# Patient Record
Sex: Female | Born: 1960
Health system: Southern US, Community
[De-identification: ages and names within clinical notes are randomized; demographics above are authoritative.]

## PROBLEM LIST (undated history)

## (undated) DIAGNOSIS — R87619 Unspecified abnormal cytological findings in specimens from cervix uteri: Secondary | ICD-10-CM

## (undated) DIAGNOSIS — M419 Scoliosis, unspecified: Secondary | ICD-10-CM

## (undated) DIAGNOSIS — I10 Essential (primary) hypertension: Secondary | ICD-10-CM

## (undated) DIAGNOSIS — F419 Anxiety disorder, unspecified: Secondary | ICD-10-CM

## (undated) DIAGNOSIS — F32A Depression, unspecified: Secondary | ICD-10-CM

## (undated) DIAGNOSIS — E785 Hyperlipidemia, unspecified: Secondary | ICD-10-CM

## (undated) DIAGNOSIS — IMO0002 Reserved for concepts with insufficient information to code with codable children: Secondary | ICD-10-CM

## (undated) DIAGNOSIS — D649 Anemia, unspecified: Secondary | ICD-10-CM

## (undated) HISTORY — DX: Anxiety disorder, unspecified: F41.9

## (undated) HISTORY — DX: Essential (primary) hypertension: I10

## (undated) HISTORY — PX: COLONOSCOPY: SHX174

## (undated) HISTORY — PX: SPINE SURGERY: SHX786

## (undated) HISTORY — PX: UPPER GI ENDOSCOPY: SHX6162

## (undated) HISTORY — PX: EYE SURGERY: SHX253

## (undated) HISTORY — DX: Anemia, unspecified: D64.9

## (undated) HISTORY — DX: Depression, unspecified: F32.A

## (undated) HISTORY — DX: Reserved for concepts with insufficient information to code with codable children: IMO0002

## (undated) HISTORY — PX: WISDOM TOOTH EXTRACTION: SHX21

## (undated) HISTORY — DX: Unspecified abnormal cytological findings in specimens from cervix uteri: R87.619

---

## 1999-04-13 ENCOUNTER — Other Ambulatory Visit: Admission: RE | Admit: 1999-04-13 | Discharge: 1999-04-13 | Payer: Self-pay | Admitting: Obstetrics and Gynecology

## 2000-04-19 ENCOUNTER — Other Ambulatory Visit: Admission: RE | Admit: 2000-04-19 | Discharge: 2000-04-19 | Payer: Self-pay | Admitting: *Deleted

## 2001-04-19 ENCOUNTER — Other Ambulatory Visit: Admission: RE | Admit: 2001-04-19 | Discharge: 2001-04-19 | Payer: Self-pay | Admitting: *Deleted

## 2001-12-27 ENCOUNTER — Encounter: Admission: RE | Admit: 2001-12-27 | Discharge: 2002-02-05 | Payer: Self-pay | Admitting: Internal Medicine

## 2002-05-14 ENCOUNTER — Other Ambulatory Visit: Admission: RE | Admit: 2002-05-14 | Discharge: 2002-05-14 | Payer: Self-pay | Admitting: Obstetrics and Gynecology

## 2003-05-16 ENCOUNTER — Other Ambulatory Visit: Admission: RE | Admit: 2003-05-16 | Discharge: 2003-05-16 | Payer: Self-pay | Admitting: Obstetrics and Gynecology

## 2004-07-14 ENCOUNTER — Other Ambulatory Visit: Admission: RE | Admit: 2004-07-14 | Discharge: 2004-07-14 | Payer: Self-pay | Admitting: Obstetrics and Gynecology

## 2005-07-18 ENCOUNTER — Other Ambulatory Visit: Admission: RE | Admit: 2005-07-18 | Discharge: 2005-07-18 | Payer: Self-pay | Admitting: Obstetrics and Gynecology

## 2006-01-16 ENCOUNTER — Ambulatory Visit: Payer: Self-pay | Admitting: Family Medicine

## 2006-02-01 ENCOUNTER — Ambulatory Visit: Payer: Self-pay | Admitting: Family Medicine

## 2006-02-08 ENCOUNTER — Ambulatory Visit: Payer: Self-pay | Admitting: Family Medicine

## 2006-04-13 ENCOUNTER — Ambulatory Visit: Payer: Self-pay | Admitting: Family Medicine

## 2006-05-18 ENCOUNTER — Ambulatory Visit: Payer: Self-pay | Admitting: Gastroenterology

## 2006-05-22 ENCOUNTER — Ambulatory Visit (HOSPITAL_COMMUNITY): Admission: RE | Admit: 2006-05-22 | Discharge: 2006-05-22 | Payer: Self-pay | Admitting: Gastroenterology

## 2006-05-26 ENCOUNTER — Ambulatory Visit: Payer: Self-pay | Admitting: Gastroenterology

## 2006-05-30 ENCOUNTER — Ambulatory Visit: Payer: Self-pay | Admitting: Gastroenterology

## 2006-05-30 ENCOUNTER — Encounter (INDEPENDENT_AMBULATORY_CARE_PROVIDER_SITE_OTHER): Payer: Self-pay | Admitting: Specialist

## 2006-06-15 ENCOUNTER — Ambulatory Visit: Payer: Self-pay | Admitting: Gastroenterology

## 2006-06-21 ENCOUNTER — Ambulatory Visit: Payer: Self-pay | Admitting: Critical Care Medicine

## 2006-07-17 ENCOUNTER — Ambulatory Visit: Payer: Self-pay | Admitting: Critical Care Medicine

## 2006-09-14 ENCOUNTER — Other Ambulatory Visit: Admission: RE | Admit: 2006-09-14 | Discharge: 2006-09-14 | Payer: Self-pay | Admitting: Obstetrics & Gynecology

## 2007-05-26 ENCOUNTER — Emergency Department (HOSPITAL_COMMUNITY): Admission: EM | Admit: 2007-05-26 | Discharge: 2007-05-27 | Payer: Self-pay | Admitting: Emergency Medicine

## 2007-06-04 ENCOUNTER — Ambulatory Visit: Payer: Self-pay | Admitting: Family Medicine

## 2007-06-04 DIAGNOSIS — S139XXA Sprain of joints and ligaments of unspecified parts of neck, initial encounter: Secondary | ICD-10-CM | POA: Insufficient documentation

## 2007-06-06 ENCOUNTER — Telehealth (INDEPENDENT_AMBULATORY_CARE_PROVIDER_SITE_OTHER): Payer: Self-pay | Admitting: *Deleted

## 2007-08-21 ENCOUNTER — Ambulatory Visit: Payer: Self-pay | Admitting: Family Medicine

## 2007-08-23 ENCOUNTER — Encounter (INDEPENDENT_AMBULATORY_CARE_PROVIDER_SITE_OTHER): Payer: Self-pay | Admitting: *Deleted

## 2007-09-18 ENCOUNTER — Other Ambulatory Visit: Admission: RE | Admit: 2007-09-18 | Discharge: 2007-09-18 | Payer: Self-pay | Admitting: Obstetrics and Gynecology

## 2007-11-26 ENCOUNTER — Ambulatory Visit: Payer: Self-pay | Admitting: Family Medicine

## 2007-12-06 LAB — CONVERTED CEMR LAB
AST: 22 units/L (ref 0–37)
Bilirubin, Direct: 0.1 mg/dL (ref 0.0–0.3)
HDL: 77 mg/dL (ref >39.0)
Total Bilirubin: 0.7 mg/dL (ref 0.3–1.2)
Total Protein: 6.9 g/dL (ref 6.0–8.3)
Triglycerides: 158 mg/dL — ABNORMAL HIGH (ref 0–149)
VLDL: 32 mg/dL (ref 0–40)

## 2007-12-07 ENCOUNTER — Encounter (INDEPENDENT_AMBULATORY_CARE_PROVIDER_SITE_OTHER): Payer: Self-pay | Admitting: *Deleted

## 2008-08-11 ENCOUNTER — Ambulatory Visit: Payer: Self-pay | Admitting: Family Medicine

## 2008-08-11 DIAGNOSIS — J069 Acute upper respiratory infection, unspecified: Secondary | ICD-10-CM | POA: Insufficient documentation

## 2008-08-11 LAB — CONVERTED CEMR LAB: Rapid Strep: NEGATIVE

## 2008-08-19 ENCOUNTER — Telehealth: Payer: Self-pay | Admitting: Family Medicine

## 2008-11-18 ENCOUNTER — Other Ambulatory Visit: Admission: RE | Admit: 2008-11-18 | Discharge: 2008-11-18 | Payer: Self-pay | Admitting: Obstetrics & Gynecology

## 2009-06-08 ENCOUNTER — Ambulatory Visit: Payer: Self-pay | Admitting: Family Medicine

## 2010-09-12 HISTORY — PX: REFRACTIVE SURGERY: SHX103

## 2010-10-10 LAB — CONVERTED CEMR LAB
ALT: 19 units/L (ref 0–35)
AST: 20 units/L (ref 0–37)
AST: 23 units/L (ref 0–37)
Albumin: 4 g/dL (ref 3.5–5.2)
Alkaline Phosphatase: 56 units/L (ref 39–117)
BUN: 18 mg/dL (ref 6–23)
Basophils Absolute: 0 10*3/uL (ref 0.0–0.1)
Bilirubin, Direct: 0 mg/dL (ref 0.0–0.3)
CO2: 29 meq/L (ref 19–32)
Calcium: 9 mg/dL (ref 8.4–10.5)
Chloride: 102 meq/L (ref 96–112)
Chloride: 107 meq/L (ref 96–112)
Direct LDL: 63.7 mg/dL
Eosinophils Absolute: 0.1 10*3/uL (ref 0.0–0.6)
Eosinophils Absolute: 0.1 10*3/uL (ref 0.0–0.7)
GFR calc non Af Amer: 96 mL/min
Glucose, Bld: 83 mg/dL (ref 70–99)
HCT: 36.8 % (ref 36.0–46.0)
HDL: 59.9 mg/dL (ref >39.00)
HDL: 93.8 mg/dL (ref >39.0)
Lymphs Abs: 1.4 10*3/uL (ref 0.7–4.0)
MCHC: 34.8 g/dL (ref 30.0–36.0)
MCHC: 34.9 g/dL (ref 30.0–36.0)
MCV: 94.9 fL (ref 78.0–100.0)
MCV: 97.1 fL (ref 78.0–100.0)
Neutro Abs: 6.5 10*3/uL (ref 1.4–7.7)
Neutrophils Relative %: 67.6 % (ref 43.0–77.0)
Pap Smear: NORMAL
Platelets: 210 10*3/uL (ref 150.0–400.0)
Platelets: 243 10*3/uL (ref 150–400)
Potassium: 4.5 meq/L (ref 3.5–5.1)
RBC: 3.7 M/uL — ABNORMAL LOW (ref 3.87–5.11)
RDW: 11.5 % (ref 11.5–14.6)
Sodium: 136 meq/L (ref 135–145)
Total Protein: 7.2 g/dL (ref 6.0–8.3)
Triglycerides: 219 mg/dL (ref 0–149)

## 2011-01-28 NOTE — Assessment & Plan Note (Signed)
[facility]                               PULMONARY OFFICE NOTE   Nicole Blackburn, Nicole Blackburn                        MRN:          [id number]  DATE:06/21/2006                            DOB:          08-08-1961    CHIEF COMPLAINT:  Evaluate cough.   This is a 50 year old white female whose had a chronic cough for several  months, no chest pain, no wheezing. The cough is productive of clear mucous.  There is no hemoptysis. She had some postnasal drainage, no real heartburn,  denies any cough at night, initially was thought to have GI symptom complex  and underwent upper endoscopy. This revealed a mild lesion at the distal  esophagus. Barium swallow was unremarkable. There was no dyspnea with  exertion though, no symptoms of shortness of breath at all, there was no  chest pain, no regular heartbeats, no symptoms of loss of appetite,  indigestion, weight change, abdominal pain and there is no difficulty  swallowing, no sore throat, tooth or dental problems. No headaches, nasal  congestion, sneezing, itching, earache, anxiety, depression, hand or foot  swelling. There is no joint stiffness, rash or change in color of mucous.   PAST MEDICAL HISTORY:  Medical, no history of hypertension, heart disease,  or angina. No history of heart failure, heart dysrhythmias, asthma,  emphysema, diabetes, increased cholesterol. No history of stroke, blood  clots, cancer, allergies, headache, sleep disordered breathing.   PAST SURGICAL HISTORY:  None.   MEDICATION ALLERGIES:  None.   CURRENT MEDICATIONS:  Birth control pills, multivitamin and p.r.n. Mucinex.   SOCIAL HISTORY:  Smoked less than half a pack a day during college but quit  since that time. Works as an Airline pilot. Lives with her fiance, is divorced,  has children.   FAMILY HISTORY:  Cancer in maternal grandmother and maternal grandfather.   REVIEW OF SYSTEMS:  Otherwise noncontributory.   PHYSICAL  EXAMINATION:  GENERAL:  This is a middle-age female in no distress.  VITAL SIGNS:  Temperature 98, blood pressure 118/82, pulse 71, saturation  98% on room air, weight 105 pounds.  CHEST:  Showed to be completely clear without evidence of any adventitious  breath sounds.  CARDIAC:  Showed a regular rate and rhythm without S3, normal S1, S2.  ABDOMEN:  Soft, nontender.  EXTREMITIES:  Showed no edema or clubbing.  SKIN:  Clear.  NEUROLOGIC:  Intact.  HEENT:  No jugular venous distention. No lymphadenopathy. Oropharynx clear.  Nares showed mild nasal inflammation with posterior pharyngeal drainage as  documented and cobblestoning seen of the posterior oropharynx.  NECK:  Supple.   LABORATORY DATA:  Spirometry was obtained and revealed normal spirometry  FEV1 of 106% of predicted, FEC of 115% of predicted. Chest x-ray showed no  active disease process.   IMPRESSION:  Likely cyclical cough on the basis of post nasal drip syndrome  with chronic allergic  rhinitis and probable occult laryngopharyngeal reflux  both of which are triggering an aggravating cough Synthroid.   RECOMMENDATIONS:  Begin cyclic cough protocol with Tussionex and Tessalon  Perles. This  was gone over with the patient in great detail. Begin Zegerid  40 mg a day for laryngopharyngeal reflux for 6 weeks and begin Brovex which  is Brompheniramine 5 mL b.i.d. for nasal decongestion and reduction of  posterior nasal drainage. We will see this patient back in return followup  in 6 weeks.       Charlcie Cradle Delford Field, MD, [facility]      PEW/MedQ  DD:  06/21/2006  DT:  06/23/2006  Job #:  [id number]   cc:   Barbette Hair. Arlyce Dice, MD,FACG  Lelon Perla, DO

## 2011-01-28 NOTE — Assessment & Plan Note (Signed)
[facility]                               PULMONARY OFFICE NOTE   Nicole, Blackburn                        MRN:          [id number]  DATE:07/17/2006                            DOB:          23-Feb-1961    Ms. Nicole Blackburn returns today.  She is a 50 year old white female with cyclic  cough, likely due to postnasal drainage and reflux disease.  She uses BroveX  p.r.n. and the cough protocol.  Her cough is now gone.  She has maintained  Zegerid 40 mg daily, and on this, she is no longer having any reflux  symptoms complex.   PHYSICAL EXAMINATION:  VITAL SIGNS:  Temp 98, blood pressure 90/66, pulse  68, saturation 99% on room air.  CHEST:  Completely clear without evidence of wheeze, rales, or rhonchi.  CARDIAC:  Regular rate and rhythm without S3.  Normal S1 and S2.  ABDOMEN:  Soft, nontender.  EXTREMITIES:  No clubbing or edema.  SKIN:  Clear.  NEUROLOGIC:  Intact.  HEENT:  No jugular venous distention.  No lymphadenopathy.  Oropharynx  clear.  NECK:  Supple.   IMPRESSION:  Cyclic cough on the basis of post nasal drip syndrome and  reflux disease.   The plan is to maintain Zegerid 40 mg daily and to utilize cough suppression  p.r.n.  We will see this patient back on an as-needed basis.  She was also  given a reflux disease diet.     Charlcie Cradle Delford Field, MD, [facility]  Electronically Signed    PEW/MedQ  DD: 07/17/2006  DT: 07/18/2006  Job #: [id number]   cc:   Loreen Freud, M.D.  Barbette Hair. Arlyce Dice, MD,FACG

## 2011-01-28 NOTE — Assessment & Plan Note (Signed)
[facility]                           GASTROENTEROLOGY OFFICE NOTE   Nicole Blackburn, Nicole Blackburn                        MRN:          [id number]  DATE:06/15/2006                            DOB:          21-Mar-1961    PROBLEM:  Cough.   Nicole Blackburn has returned for scheduled GI followup.  Cough is minimally  improved with Mucinex.  She complains of spontaneous fits of coughing, or  coughing that will occur within 5 minutes of eating.  Barium swallow is  negative.  Upper endoscopy was also unremarkable.   EXAMINATION:  Pulse 68, blood pressure 102/60, weight 107.   IMPRESSION:  Persistent cough that is both spontaneous and immediately  postprandial.  There is no evidence for GI disease including reflux or  aspiration.  This raises question of sinus drip or other pulmonary causes  for coughing including allergies.   RECOMMENDATIONS:  Pulmonary consult.       Barbette Hair. Arlyce Dice, MD,FACG      RDK/MedQ  DD:  06/15/2006  DT:  06/16/2006  Job #:  [id number]   cc:   Lelon Perla, DO

## 2011-01-28 NOTE — Assessment & Plan Note (Signed)
[facility]                           GASTROENTEROLOGY OFFICE NOTE   ENDIA, MONCUR                        MRN:          [id number]  DATE:05/18/2006                            DOB:          06/07/1961    PROBLEM:  Cough.   REASON:  Nicole Blackburn is a 50 -year-old white female referred through the  courtesy of Dr. Laury Axon for evaluation.  She has been complaining of immediate  post prandial coughing.  This may last five to ten minutes.  It is initially  non-productive then does become productive.  She denies dysphagia,  odynophagia or pyrosis.  She has taken both Protonix and Nexium without  improvement in her symptoms.  There is no history of asthma, sore throat or  regurgitation of gastric contents.   PAST MEDICAL HISTORY:  Unremarkable.   FAMILY HISTORY:  Non-contributory.   MEDICATIONS:  Only medications is an oral contraceptive.   ALLERGIES:  She has no allergies.   SOCIAL HISTORY:  She doe not smoke.  She drinks occasionally.  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   REVIEW OF SYSTEMS:  Was reviewed and is negative.   PHYSICAL EXAMINATION:  VITAL SIGNS:  Pulse 64.  Blood pressure 110/60.  Weight 108.  HEENT: EOMI. PERRLA. Sclerae are anicteric.  Conjunctivae are pink.  NECK:  Supple without thyromegaly, adenopathy or carotid bruits.  CHEST:  Clear to auscultation and percussion without adventitious sounds.  CARDIAC:  Regular rhythm; normal S1 S2.  There are no murmurs, gallops or  rubs.  ABDOMEN:  Bowel sounds are normoactive.  Abdomen is soft, non-tender and non-  distended.  There are no abdominal masses, tenderness, splenic enlargement  or hepatomegaly.  EXTREMITIES:  Full range of motion.  No cyanosis, clubbing or edema.  RECTAL:  Deferred.   IMPRESSION:  Post prandial cough.  This raises the question of a possible  minimal aspiration.  She denies dysphagia which would medicate against an  esophageal stricture.  There  are no obvious thyroid masses.   RECOMMENDATION:  Barium swallow.                                   Barbette Hair. Arlyce Dice, MD,FACG   RDK/MedQ  DD:  05/18/2006  DT:  05/18/2006  Job #:  [id number]   cc:   Lelon Perla, DO

## 2011-04-28 ENCOUNTER — Encounter: Payer: Self-pay | Admitting: Family Medicine

## 2011-06-24 LAB — POCT I-STAT CREATININE
Creatinine, Ser: 0.8
Operator id: 265201

## 2011-06-24 LAB — COMPREHENSIVE METABOLIC PANEL
ALT: 24
AST: 26
Albumin: 4
Alkaline Phosphatase: 55
BUN: 16
CO2: 26
Calcium: 9.2
Chloride: 105
Creatinine, Ser: 0.69
GFR calc non Af Amer: >60
Glucose, Bld: 86
Potassium: 4
Sodium: 138
Total Bilirubin: 0.2 — ABNORMAL LOW
Total Protein: 7

## 2011-06-24 LAB — I-STAT 8, (EC8 V) (CONVERTED LAB)
Acid-base deficit: 1
BUN: 17
Bicarbonate: 24.6 — ABNORMAL HIGH
Chloride: 106
Glucose, Bld: 88
HCT: 36
Hemoglobin: 12.2
Operator id: 265201
Potassium: 4.1
Sodium: 136
TCO2: 26
pCO2, Ven: 42.5 — ABNORMAL LOW
pH, Ven: 7.371 — ABNORMAL HIGH

## 2011-06-24 LAB — POCT CARDIAC MARKERS
CKMB, poc: <1 — ABNORMAL LOW
Myoglobin, poc: 36.6
Operator id: 265201
Troponin i, poc: <0.05

## 2011-06-24 LAB — D-DIMER, QUANTITATIVE

## 2011-06-24 LAB — LIPASE, BLOOD: Lipase: 32

## 2012-06-07 ENCOUNTER — Encounter: Payer: Self-pay | Admitting: Family Medicine

## 2012-10-18 ENCOUNTER — Encounter: Payer: Self-pay | Admitting: Family Medicine

## 2012-10-26 ENCOUNTER — Encounter: Payer: Self-pay | Admitting: Nurse Practitioner

## 2012-10-26 DIAGNOSIS — D649 Anemia, unspecified: Secondary | ICD-10-CM | POA: Insufficient documentation

## 2012-12-07 ENCOUNTER — Ambulatory Visit: Payer: Self-pay | Admitting: Nurse Practitioner

## 2012-12-11 ENCOUNTER — Ambulatory Visit (INDEPENDENT_AMBULATORY_CARE_PROVIDER_SITE_OTHER): Payer: BC Managed Care – PPO | Admitting: Family Medicine

## 2012-12-11 ENCOUNTER — Encounter: Payer: Self-pay | Admitting: Family Medicine

## 2012-12-11 VITALS — BP 110/74 | HR 77 | Temp 98.7°F | Ht 59.5 in | Wt 110.4 lb

## 2012-12-11 DIAGNOSIS — M4120 Other idiopathic scoliosis, site unspecified: Secondary | ICD-10-CM

## 2012-12-11 DIAGNOSIS — Z0000 Encounter for general adult medical examination without abnormal findings: Secondary | ICD-10-CM

## 2012-12-11 DIAGNOSIS — M549 Dorsalgia, unspecified: Secondary | ICD-10-CM

## 2012-12-11 DIAGNOSIS — R05 Cough: Secondary | ICD-10-CM

## 2012-12-11 DIAGNOSIS — R059 Cough, unspecified: Secondary | ICD-10-CM | POA: Insufficient documentation

## 2012-12-11 LAB — BASIC METABOLIC PANEL
BUN: 13 mg/dL (ref 6–23)
Creatinine, Ser: 0.6 mg/dL (ref 0.4–1.2)
GFR: 103.74 mL/min (ref >60.00)

## 2012-12-11 LAB — HEPATIC FUNCTION PANEL
ALT: 19 U/L (ref 0–35)
AST: 24 U/L (ref 0–37)
Bilirubin, Direct: 0.2 mg/dL (ref 0.0–0.3)
Total Bilirubin: 1 mg/dL (ref 0.3–1.2)

## 2012-12-11 LAB — LIPID PANEL
LDL Cholesterol: 82 mg/dL (ref 0–99)
Total CHOL/HDL Ratio: 3
Triglycerides: 146 mg/dL (ref 0.0–149.0)
VLDL: 29.2 mg/dL (ref 0.0–40.0)

## 2012-12-11 LAB — CBC WITH DIFFERENTIAL/PLATELET
Eosinophils Relative: 2.1 % (ref 0.0–5.0)
Monocytes Absolute: 0.3 10*3/uL (ref 0.1–1.0)
Monocytes Relative: 6.2 % (ref 3.0–12.0)
Neutrophils Relative %: 65.2 % (ref 43.0–77.0)
Platelets: 231 10*3/uL (ref 150.0–400.0)
WBC: 5.5 10*3/uL (ref 4.5–10.5)

## 2012-12-11 MED ORDER — BENZONATATE 100 MG PO CAPS: 100.0000 mg | ORAL_CAPSULE | Freq: Three times a day (TID) | ORAL | Status: DC | PRN

## 2012-12-11 NOTE — Patient Instructions (Addendum)
Preventive Care for Adults, Female A healthy lifestyle and preventive care can promote health and wellness. Preventive health guidelines for women include the following key practices.  A routine yearly physical is a good way to check with your caregiver about your health and preventive screening. It is a chance to share any concerns and updates on your health, and to receive a thorough exam.  Visit your dentist for a routine exam and preventive care every 6 months. Brush your teeth twice a day and floss once a day. Good oral hygiene prevents tooth decay and gum disease.  The frequency of eye exams is based on your age, health, family medical history, use of contact lenses, and other factors. Follow your caregiver's recommendations for frequency of eye exams.  Eat a healthy diet. Foods like vegetables, fruits, whole grains, low-fat dairy products, and lean protein foods contain the nutrients you need without too many calories. Decrease your intake of foods high in solid fats, added sugars, and salt. Eat the right amount of calories for you.Get information about a proper diet from your caregiver, if necessary.  Regular physical exercise is one of the most important things you can do for your health. Most adults should get at least 150 minutes of moderate-intensity exercise (any activity that increases your heart rate and causes you to sweat) each week. In addition, most adults need muscle-strengthening exercises on 2 or more days a week.  Maintain a healthy weight. The body mass index (BMI) is a screening tool to identify possible weight problems. It provides an estimate of body fat based on height and weight. Your caregiver can help determine your BMI, and can help you achieve or maintain a healthy weight.For adults 20 years and older:  A BMI below 18.5 is considered underweight.  A BMI of 18.5 to 24.9 is normal.  A BMI of 25 to 29.9 is considered overweight.  A BMI of 30 and above is  considered obese.  Maintain normal blood lipids and cholesterol levels by exercising and minimizing your intake of saturated fat. Eat a balanced diet with plenty of fruit and vegetables. Blood tests for lipids and cholesterol should begin at age 20 and be repeated every 5 years. If your lipid or cholesterol levels are high, you are over 50, or you are at high risk for heart disease, you may need your cholesterol levels checked more frequently.Ongoing high lipid and cholesterol levels should be treated with medicines if diet and exercise are not effective.  If you smoke, find out from your caregiver how to quit. If you do not use tobacco, do not start.  If you are pregnant, do not drink alcohol. If you are breastfeeding, be very cautious about drinking alcohol. If you are not pregnant and choose to drink alcohol, do not exceed 1 drink per day. One drink is considered to be 12 ounces (355 mL) of beer, 5 ounces (148 mL) of wine, or 1.5 ounces (44 mL) of liquor.  Avoid use of street drugs. Do not share needles with anyone. Ask for help if you need support or instructions about stopping the use of drugs.  High blood pressure causes heart disease and increases the risk of stroke. Your blood pressure should be checked at least every 1 to 2 years. Ongoing high blood pressure should be treated with medicines if weight loss and exercise are not effective.  If you are 55 to 52 years old, ask your caregiver if you should take aspirin to prevent strokes.  Diabetes   screening involves taking a blood sample to check your fasting blood sugar level. This should be done once every 3 years, after age 45, if you are within normal weight and without risk factors for diabetes. Testing should be considered at a younger age or be carried out more frequently if you are overweight and have at least 1 risk factor for diabetes.  Breast cancer screening is essential preventive care for women. You should practice "breast  self-awareness." This means understanding the normal appearance and feel of your breasts and may include breast self-examination. Any changes detected, no matter how small, should be reported to a caregiver. Women in their 20s and 30s should have a clinical breast exam (CBE) by a caregiver as part of a regular health exam every 1 to 3 years. After age 40, women should have a CBE every year. Starting at age 40, women should consider having a mammography (breast X-ray test) every year. Women who have a family history of breast cancer should talk to their caregiver about genetic screening. Women at a high risk of breast cancer should talk to their caregivers about having magnetic resonance imaging (MRI) and a mammography every year.  The Pap test is a screening test for cervical cancer. A Pap test can show cell changes on the cervix that might become cervical cancer if left untreated. A Pap test is a procedure in which cells are obtained and examined from the lower end of the uterus (cervix).  Women should have a Pap test starting at age 21.  Between ages 21 and 29, Pap tests should be repeated every 2 years.  Beginning at age 30, you should have a Pap test every 3 years as long as the past 3 Pap tests have been normal.  Some women have medical problems that increase the chance of getting cervical cancer. Talk to your caregiver about these problems. It is especially important to talk to your caregiver if a new problem develops soon after your last Pap test. In these cases, your caregiver may recommend more frequent screening and Pap tests.  The above recommendations are the same for women who have or have not gotten the vaccine for human papillomavirus (HPV).  If you had a hysterectomy for a problem that was not cancer or a condition that could lead to cancer, then you no longer need Pap tests. Even if you no longer need a Pap test, a regular exam is a good idea to make sure no other problems are  starting.  If you are between ages 65 and 70, and you have had normal Pap tests going back 10 years, you no longer need Pap tests. Even if you no longer need a Pap test, a regular exam is a good idea to make sure no other problems are starting.  If you have had past treatment for cervical cancer or a condition that could lead to cancer, you need Pap tests and screening for cancer for at least 20 years after your treatment.  If Pap tests have been discontinued, risk factors (such as a new sexual partner) need to be reassessed to determine if screening should be resumed.  The HPV test is an additional test that may be used for cervical cancer screening. The HPV test looks for the virus that can cause the cell changes on the cervix. The cells collected during the Pap test can be tested for HPV. The HPV test could be used to screen women aged 30 years and older, and should   be used in women of any age who have unclear Pap test results. After the age of 30, women should have HPV testing at the same frequency as a Pap test.  Colorectal cancer can be detected and often prevented. Most routine colorectal cancer screening begins at the age of 50 and continues through age 75. However, your caregiver may recommend screening at an earlier age if you have risk factors for colon cancer. On a yearly basis, your caregiver may provide home test kits to check for hidden blood in the stool. Use of a small camera at the end of a tube, to directly examine the colon (sigmoidoscopy or colonoscopy), can detect the earliest forms of colorectal cancer. Talk to your caregiver about this at age 50, when routine screening begins. Direct examination of the colon should be repeated every 5 to 10 years through age 75, unless early forms of pre-cancerous polyps or small growths are found.  Hepatitis C blood testing is recommended for all people born from 1945 through 1965 and any individual with known risks for hepatitis C.  Practice  safe sex. Use condoms and avoid high-risk sexual practices to reduce the spread of sexually transmitted infections (STIs). STIs include gonorrhea, chlamydia, syphilis, trichomonas, herpes, HPV, and human immunodeficiency virus (HIV). Herpes, HIV, and HPV are viral illnesses that have no cure. They can result in disability, cancer, and death. Sexually active women aged 25 and younger should be checked for chlamydia. Older women with new or multiple partners should also be tested for chlamydia. Testing for other STIs is recommended if you are sexually active and at increased risk.  Osteoporosis is a disease in which the bones lose minerals and strength with aging. This can result in serious bone fractures. The risk of osteoporosis can be identified using a bone density scan. Women ages 65 and over and women at risk for fractures or osteoporosis should discuss screening with their caregivers. Ask your caregiver whether you should take a calcium supplement or vitamin D to reduce the rate of osteoporosis.  Menopause can be associated with physical symptoms and risks. Hormone replacement therapy is available to decrease symptoms and risks. You should talk to your caregiver about whether hormone replacement therapy is right for you.  Use sunscreen with sun protection factor (SPF) of 30 or more. Apply sunscreen liberally and repeatedly throughout the day. You should seek shade when your shadow is shorter than you. Protect yourself by wearing long sleeves, pants, a wide-brimmed hat, and sunglasses year round, whenever you are outdoors.  Once a month, do a whole body skin exam, using a mirror to look at the skin on your back. Notify your caregiver of new moles, moles that have irregular borders, moles that are larger than a pencil eraser, or moles that have changed in shape or color.  Stay current with required immunizations.  Influenza. You need a dose every fall (or winter). The composition of the flu vaccine  changes each year, so being vaccinated once is not enough.  Pneumococcal polysaccharide. You need 1 to 2 doses if you smoke cigarettes or if you have certain chronic medical conditions. You need 1 dose at age 65 (or older) if you have never been vaccinated.  Tetanus, diphtheria, pertussis (Tdap, Td). Get 1 dose of Tdap vaccine if you are younger than age 65, are over 65 and have contact with an infant, are a healthcare worker, are pregnant, or simply want to be protected from whooping cough. After that, you need a Td   booster dose every 10 years. Consult your caregiver if you have not had at least 3 tetanus and diphtheria-containing shots sometime in your life or have a deep or dirty wound.  HPV. You need this vaccine if you are a woman age 26 or younger. The vaccine is given in 3 doses over 6 months.  Measles, mumps, rubella (MMR). You need at least 1 dose of MMR if you were born in 1957 or later. You may also need a second dose.  Meningococcal. If you are age 19 to 21 and a first-year college student living in a residence hall, or have one of several medical conditions, you need to get vaccinated against meningococcal disease. You may also need additional booster doses.  Zoster (shingles). If you are age 60 or older, you should get this vaccine.  Varicella (chickenpox). If you have never had chickenpox or you were vaccinated but received only 1 dose, talk to your caregiver to find out if you need this vaccine.  Hepatitis A. You need this vaccine if you have a specific risk factor for hepatitis A virus infection or you simply wish to be protected from this disease. The vaccine is usually given as 2 doses, 6 to 18 months apart.  Hepatitis B. You need this vaccine if you have a specific risk factor for hepatitis B virus infection or you simply wish to be protected from this disease. The vaccine is given in 3 doses, usually over 6 months. Preventive Services / Frequency Ages 19 to 39  Blood  pressure check.** / Every 1 to 2 years.  Lipid and cholesterol check.** / Every 5 years beginning at age 20.  Clinical breast exam.** / Every 3 years for women in their 20s and 30s.  Pap test.** / Every 2 years from ages 21 through 29. Every 3 years starting at age 30 through age 65 or 70 with a history of 3 consecutive normal Pap tests.  HPV screening.** / Every 3 years from ages 30 through ages 65 to 70 with a history of 3 consecutive normal Pap tests.  Hepatitis C blood test.** / For any individual with known risks for hepatitis C.  Skin self-exam. / Monthly.  Influenza immunization.** / Every year.  Pneumococcal polysaccharide immunization.** / 1 to 2 doses if you smoke cigarettes or if you have certain chronic medical conditions.  Tetanus, diphtheria, pertussis (Tdap, Td) immunization. / A one-time dose of Tdap vaccine. After that, you need a Td booster dose every 10 years.  HPV immunization. / 3 doses over 6 months, if you are 26 and younger.  Measles, mumps, rubella (MMR) immunization. / You need at least 1 dose of MMR if you were born in 1957 or later. You may also need a second dose.  Meningococcal immunization. / 1 dose if you are age 19 to 21 and a first-year college student living in a residence hall, or have one of several medical conditions, you need to get vaccinated against meningococcal disease. You may also need additional booster doses.  Varicella immunization.** / Consult your caregiver.  Hepatitis A immunization.** / Consult your caregiver. 2 doses, 6 to 18 months apart.  Hepatitis B immunization.** / Consult your caregiver. 3 doses usually over 6 months. Ages 40 to 64  Blood pressure check.** / Every 1 to 2 years.  Lipid and cholesterol check.** / Every 5 years beginning at age 20.  Clinical breast exam.** / Every year after age 40.  Mammogram.** / Every year beginning at age 40   and continuing for as long as you are in good health. Consult with your  caregiver.  Pap test.** / Every 3 years starting at age 30 through age 65 or 70 with a history of 3 consecutive normal Pap tests.  HPV screening.** / Every 3 years from ages 30 through ages 65 to 70 with a history of 3 consecutive normal Pap tests.  Fecal occult blood test (FOBT) of stool. / Every year beginning at age 50 and continuing until age 75. You may not need to do this test if you get a colonoscopy every 10 years.  Flexible sigmoidoscopy or colonoscopy.** / Every 5 years for a flexible sigmoidoscopy or every 10 years for a colonoscopy beginning at age 50 and continuing until age 75.  Hepatitis C blood test.** / For all people born from 1945 through 1965 and any individual with known risks for hepatitis C.  Skin self-exam. / Monthly.  Influenza immunization.** / Every year.  Pneumococcal polysaccharide immunization.** / 1 to 2 doses if you smoke cigarettes or if you have certain chronic medical conditions.  Tetanus, diphtheria, pertussis (Tdap, Td) immunization.** / A one-time dose of Tdap vaccine. After that, you need a Td booster dose every 10 years.  Measles, mumps, rubella (MMR) immunization. / You need at least 1 dose of MMR if you were born in 1957 or later. You may also need a second dose.  Varicella immunization.** / Consult your caregiver.  Meningococcal immunization.** / Consult your caregiver.  Hepatitis A immunization.** / Consult your caregiver. 2 doses, 6 to 18 months apart.  Hepatitis B immunization.** / Consult your caregiver. 3 doses, usually over 6 months. Ages 65 and over  Blood pressure check.** / Every 1 to 2 years.  Lipid and cholesterol check.** / Every 5 years beginning at age 20.  Clinical breast exam.** / Every year after age 40.  Mammogram.** / Every year beginning at age 40 and continuing for as long as you are in good health. Consult with your caregiver.  Pap test.** / Every 3 years starting at age 30 through age 65 or 70 with a 3  consecutive normal Pap tests. Testing can be stopped between 65 and 70 with 3 consecutive normal Pap tests and no abnormal Pap or HPV tests in the past 10 years.  HPV screening.** / Every 3 years from ages 30 through ages 65 or 70 with a history of 3 consecutive normal Pap tests. Testing can be stopped between 65 and 70 with 3 consecutive normal Pap tests and no abnormal Pap or HPV tests in the past 10 years.  Fecal occult blood test (FOBT) of stool. / Every year beginning at age 50 and continuing until age 75. You may not need to do this test if you get a colonoscopy every 10 years.  Flexible sigmoidoscopy or colonoscopy.** / Every 5 years for a flexible sigmoidoscopy or every 10 years for a colonoscopy beginning at age 50 and continuing until age 75.  Hepatitis C blood test.** / For all people born from 1945 through 1965 and any individual with known risks for hepatitis C.  Osteoporosis screening.** / A one-time screening for women ages 65 and over and women at risk for fractures or osteoporosis.  Skin self-exam. / Monthly.  Influenza immunization.** / Every year.  Pneumococcal polysaccharide immunization.** / 1 dose at age 65 (or older) if you have never been vaccinated.  Tetanus, diphtheria, pertussis (Tdap, Td) immunization. / A one-time dose of Tdap vaccine if you are over   65 and have contact with an infant, are a healthcare worker, or simply want to be protected from whooping cough. After that, you need a Td booster dose every 10 years.  Varicella immunization.** / Consult your caregiver.  Meningococcal immunization.** / Consult your caregiver.  Hepatitis A immunization.** / Consult your caregiver. 2 doses, 6 to 18 months apart.  Hepatitis B immunization.** / Check with your caregiver. 3 doses, usually over 6 months. ** Family history and personal history of risk and conditions may change your caregiver's recommendations. Document Released: 10/25/2001 Document Revised: 11/21/2011  Document Reviewed: 01/24/2011 [employer] Patient Information 2013 [employer].  

## 2012-12-11 NOTE — Assessment & Plan Note (Signed)
Pt requesting referral.

## 2012-12-11 NOTE — Progress Notes (Signed)
Subjective:     Nicole Blackburn is a 52 y.o. female and is here for a comprehensive physical exam. The patient reports problems - back pain ,  hx scoliosis--she is requesting a referral to specialist.  History   Social History  . Marital Status: Married    Spouse Name: N/A    Number of Children: N/A  . Years of Education: N/A   Occupational History  . [employer]--director marketing    Social History Main Topics  . Smoking status: Never Smoker   . Smokeless tobacco: Never Used  . Alcohol Use: Yes     Comment: Socially  . Drug Use: No  . Sexually Active: Yes -- Female partner(s)   Other Topics Concern  . Not on file   Social History Narrative   Exercise-- elliptical   Health Maintenance  Topic Date Due  . Influenza Vaccine  05/13/2013  . Mammogram  05/02/2014  . Pap Smear  12/12/2014  . Tetanus/tdap  06/09/2019  . Colonoscopy  02/28/2022    The following portions of the patient's history were reviewed and updated as appropriate:  She  has a past medical history of Abnormal pap (1997/1998) and Anemia. She  does not have any pertinent problems on file. She  has no past surgical history on file. Her family history includes COPD in her mother; Diabetes in her father; and Hypertension in her brother, father, and sister. She  reports that she has never smoked. She has never used smokeless tobacco. She reports that  drinks alcohol. She reports that she does not use illicit drugs. She has a current medication list which includes the following prescription(s): calcium carbonate-vit d-min, cholecalciferol, fiber, multiple vitamins-minerals, norethindrone-ethinyl estradiol, and benzonatate. Current Outpatient Prescriptions on File Prior to Visit  Medication Sig Dispense Refill  . Calcium Carbonate-Vit D-Min (CALCIUM 1200 PO) Take by mouth.      . FIBER PO Take by mouth.      . Multiple Vitamins-Minerals (MULTIVITAMIN PO) Take by mouth.      . norethindrone-ethinyl estradiol  (MICROGESTIN,JUNEL,LOESTRIN) 1-20 MG-MCG tablet Take 1 tablet by mouth daily.       No current facility-administered medications on file prior to visit.   She has No Known Allergies..  Review of Systems Review of Systems  Constitutional: Negative for activity change, appetite change and fatigue.  HENT: Negative for hearing loss, congestion, tinnitus and ear discharge.  dentist q58m Eyes: Negative for visual disturbance (see optho q1y -- vision corrected to 20/20 with glasses).  Respiratory: Negative for cough, chest tightness and shortness of breath.   Cardiovascular: Negative for chest pain, palpitations and leg swelling.  Gastrointestinal: Negative for abdominal pain, diarrhea, constipation and abdominal distention.  Genitourinary: Negative for urgency, frequency, decreased urine volume and difficulty urinating.  Musculoskeletal: Negative for back pain, arthralgias and gait problem.  Skin: Negative for color change, pallor and rash.  Neurological: Negative for dizziness, light-headedness, numbness and headaches.  Hematological: Negative for adenopathy. Does not bruise/bleed easily.  Psychiatric/Behavioral: Negative for suicidal ideas, confusion, sleep disturbance, self-injury, dysphoric mood, decreased concentration and agitation.       Objective:    BP 110/74  Pulse 77  Temp(Src) 98.7 F (37.1 C) (Oral)  Ht 4' 11.5" (1.511 m)  Wt 110 lb 6.4 oz (50.077 kg)  BMI 21.93 kg/m2  SpO2 98% General appearance: alert, cooperative, appears stated age and no distress Head: Normocephalic, without obvious abnormality, atraumatic Eyes: conjunctivae/corneas clear. PERRL, EOM's intact. Fundi benign. Ears: normal TM's and external ear  canals both ears Nose: Nares normal. Septum midline. Mucosa normal. No drainage or sinus tenderness. Throat: lips, mucosa, and tongue normal; teeth and gums normal Neck: no adenopathy, no carotid bruit, no JVD, supple, symmetrical, trachea midline and thyroid  not enlarged, symmetric, no tenderness/mass/nodules Back: + scoliosis,  dtr = and b/l, good strength in lower ext Lungs: clear to auscultation bilaterally Breasts: gyn Heart: regular rate and rhythm, S1, S2 normal, no murmur, click, rub or gallop Abdomen: soft, non-tender; bowel sounds normal; no masses,  no organomegaly Pelvic: deferred---gyn Extremities: extremities normal, atraumatic, no cyanosis or edema Pulses: 2+ and symmetric Skin: Skin color, texture, turgor normal. No rashes or lesions Lymph nodes: Cervical, supraclavicular, and axillary nodes normal. Neurologic: Alert and oriented X 3, normal strength and tone. Normal symmetric reflexes. Normal coordination and gait Psych-- no anxiety no depression      Assessment:    Healthy female exam.      Plan:    ghm utd Check labs See After Visit Summary for Counseling Recommendations

## 2012-12-11 NOTE — Assessment & Plan Note (Signed)
Refill tessalon perles for prn use

## 2012-12-13 ENCOUNTER — Ambulatory Visit (INDEPENDENT_AMBULATORY_CARE_PROVIDER_SITE_OTHER): Payer: BC Managed Care – PPO | Admitting: Nurse Practitioner

## 2012-12-13 ENCOUNTER — Encounter: Payer: Self-pay | Admitting: Nurse Practitioner

## 2012-12-13 VITALS — BP 112/62 | HR 70 | Resp 16 | Ht 59.25 in | Wt 110.0 lb

## 2012-12-13 DIAGNOSIS — Z01419 Encounter for gynecological examination (general) (routine) without abnormal findings: Secondary | ICD-10-CM

## 2012-12-13 DIAGNOSIS — Z309 Encounter for contraceptive management, unspecified: Secondary | ICD-10-CM

## 2012-12-13 DIAGNOSIS — Z0000 Encounter for general adult medical examination without abnormal findings: Secondary | ICD-10-CM

## 2012-12-13 LAB — POCT URINALYSIS DIPSTICK
Blood, UA: NEGATIVE
Ketones, UA: NEGATIVE
Protein, UA: NEGATIVE
Spec Grav, UA: >=1.03
Urobilinogen, UA: 0.2
pH, UA: 6

## 2012-12-13 MED ORDER — NORETHINDRONE ACET-ETHINYL EST 1-20 MG-MCG PO TABS: 1.0000 | ORAL_TABLET | Freq: Every day | ORAL | Status: DC

## 2012-12-13 NOTE — Progress Notes (Signed)
52 y.o. [id number] Married Caucasian Fe here for annual exam.  Menstrual cycles lasting 2-3 days. Occasional night sweats. No vaginal dryness. No new health problems except for a chronic cough thought to be from allergies. She has seen PCP and is started on Tessalon Pearles.      Patient's last menstrual period was 11/15/2012.          Sexually active: yes  The current method of family planning is OCP (estrogen/progesterone).    Exercising: no  except for outdoor yard work Smoker:  no  Health Maintenance: Pap:  12/06/11 MMG:  05/02/12 Colonoscopy:  02/2012 recheck 7 years BMD:   NA TDaP:  Unknown to consult with PCP Labs: UA, HGB   reports that she has never smoked. She has never used smokeless tobacco. She reports that she drinks about 1.0 ounces of alcohol per week. She reports that she does not use illicit drugs.  Past Medical History  Diagnosis Date  . Abnormal pap 1997/1998    cryo  . Anemia     Past Surgical History  Procedure Laterality Date  . Refractive surgery Bilateral 2012    Dr Nile Riggs    Current Outpatient Prescriptions  Medication Sig Dispense Refill  . benzonatate (TESSALON PERLES) 100 MG capsule Take 1 capsule (100 mg total) by mouth 3 (three) times daily as needed for cough.  60 capsule  2  . Calcium Carbonate-Vit D-Min (CALCIUM 1200 PO) Take by mouth.      . cholecalciferol (VITAMIN D) 1000 UNITS tablet Take 1,000 Units by mouth daily.      Marland Kitchen FIBER PO Take by mouth.      . Multiple Vitamins-Minerals (MULTIVITAMIN PO) Take by mouth.      . norethindrone-ethinyl estradiol (MICROGESTIN,JUNEL,LOESTRIN) 1-20 MG-MCG tablet Take 1 tablet by mouth daily.       No current facility-administered medications for this visit.    Family History  Problem Relation Age of Onset  . COPD Mother   . Diabetes Father   . Hypertension Father   . Hypertension Sister   . Hypertension Brother     ROS:  Pertinent items are noted in HPI.  Otherwise, a comprehensive ROS was  negative.  Exam:   BP 112/62  Pulse 70  Resp 16  Ht 4' 11.25" (1.505 m)  Wt 110 lb (49.896 kg)  BMI 22.03 kg/m2  LMP 11/15/2012  Weight change: @WEIGHT  CHANGE@ Height:   Height: 4' 11.25" (150.5 cm)  Ht Readings from Last 3 Encounters:  12/13/12 4' 11.25" (1.505 m)  12/11/12 4' 11.5" (1.511 m)  06/08/09 5' (1.524 m)    General appearance: alert, cooperative and appears stated age Head: Normocephalic, without obvious abnormality, atraumatic Neck: no adenopathy, supple, symmetrical, trachea midline and thyroid normal to inspection and palpation Lungs: clear to auscultation bilaterally Breasts: normal appearance, no masses or tenderness Heart: regular rate and rhythm Abdomen: soft, non-tender; bowel sounds normal; no masses,  no organomegaly Extremities: extremities normal, atraumatic, no cyanosis or edema Skin: Skin color, texture, turgor normal. No rashes or lesions Lymph nodes: Cervical, supraclavicular, and axillary nodes normal. No abnormal inguinal nodes palpated Neurologic: Grossly normal   Pelvic: External genitalia:  no lesions              Urethra:  normal appearing urethra with no masses, tenderness or lesions              Bartholin's and Skene's: normal  Vagina: normal appearing vagina with normal color and discharge, no lesions              Cervix: anteverted              Pap taken: no Bimanual Exam:  Uterus:  normal size, contour, position, consistency, mobility, non-tender              Adnexa: normal adnexa               Rectovaginal: Confirms               Anus:  normal sphincter tone, no lesions  A:  Well Woman with normal exam  No contraindications to continue OCP  P:   Mammogram 04/2013  pap smear as per guidelines  RF OCP here at [facility] and with mail order company  return annually or prn  An After Visit Summary was printed and given to the patient.

## 2012-12-13 NOTE — Patient Instructions (Addendum)

## 2012-12-14 MED ORDER — NORETHIN ACE-ETH ESTRAD-FE 1-20 MG-MCG PO TABS: 1.0000 | ORAL_TABLET | Freq: Every day | ORAL | Status: DC

## 2012-12-16 NOTE — Progress Notes (Signed)
Encounter reviewed by Dr. Kamee Bobst Silva.  

## 2013-03-13 ENCOUNTER — Other Ambulatory Visit: Payer: Self-pay

## 2013-03-13 MED ORDER — NORETHIN ACE-ETH ESTRAD-FE 1-20 MG-MCG PO TABS: 1.0000 | ORAL_TABLET | Freq: Every day | ORAL | Status: DC

## 2013-07-18 ENCOUNTER — Other Ambulatory Visit: Payer: Self-pay

## 2013-07-22 ENCOUNTER — Encounter: Payer: Self-pay | Admitting: Nurse Practitioner

## 2013-10-01 ENCOUNTER — Telehealth: Payer: Self-pay | Admitting: *Deleted

## 2013-10-01 NOTE — Telephone Encounter (Signed)
Appointment For: Nicole Blackburn,Nicole Blackburn ([id number])   Visit Type: MYCHART OFFICE VISIT (1064)      10/07/2013 3:00 PM 15 mins. Lelon PerlaYvonne R Lowne, DO [facility]      Patient Comments:   Office Visit   I am working with a Clinical research associatelawyer on an immigration case for    my husband. My lawyer wants me to try to get    additional information from Dr. Laury AxonLowne, possibly a    letter regarding my chronic cough or my scoliosis that    might be relevant to my case.

## 2013-10-01 NOTE — Telephone Encounter (Signed)
Please see patients note regarding needs for upcoming appointment.

## 2013-10-02 NOTE — Telephone Encounter (Signed)
im out of the office until Monday---- I only saw her once so I'm not sure how much that one visit will help.  It may be better that it comes from specialist she was referred to ---- but if she comes iin for ov  I'm happy to discuss it with her and possibly do letter while she is here.

## 2013-10-07 ENCOUNTER — Ambulatory Visit (INDEPENDENT_AMBULATORY_CARE_PROVIDER_SITE_OTHER): Payer: Managed Care, Other (non HMO) | Admitting: Family Medicine

## 2013-10-07 ENCOUNTER — Encounter: Payer: Self-pay | Admitting: Family Medicine

## 2013-10-07 VITALS — BP 118/72 | HR 75 | Temp 98.5°F | Wt 112.2 lb

## 2013-10-07 DIAGNOSIS — R05 Cough: Secondary | ICD-10-CM

## 2013-10-07 DIAGNOSIS — R059 Cough, unspecified: Secondary | ICD-10-CM

## 2013-10-07 DIAGNOSIS — R053 Chronic cough: Secondary | ICD-10-CM

## 2013-10-07 DIAGNOSIS — M4120 Other idiopathic scoliosis, site unspecified: Secondary | ICD-10-CM

## 2013-10-07 NOTE — Assessment & Plan Note (Signed)
Per ortho.  

## 2013-10-07 NOTE — Assessment & Plan Note (Signed)
otc meds prn F/u pulm prn

## 2013-10-07 NOTE — Progress Notes (Signed)
   Subjective:    Patient ID: Nicole Blackburn, female    DOB: [birth year], 53 y.o.   MRN: [id number]  HPI Pt here c/o persistant chronic cough.  She has been evaluated by pulmonary---and she was dx chronic cough.  Pt also has hx of scoliosis and does see ortho for this.  Immigration is threatening to send her husband of 7 years back to [place].   This would be extremely taxing and stressful on pt who counts on him for support and transportation per this things act up.   Review of Systems    as above Objective:   Physical Exam  BP 118/72  Pulse 75  Temp(Src) 98.5 F (36.9 C) (Oral)  Wt 112 lb 3.2 oz (50.894 kg)  SpO2 97% General appearance: alert, cooperative, appears stated age and no distress Throat: lips, mucosa, and tongue normal; teeth and gums normal Neck: no adenopathy, supple, symmetrical, trachea midline and thyroid not enlarged, symmetric, no tenderness/mass/nodules Back: +scoliosis Lungs: clear to auscultation bilaterally Heart: S1, S2 normal      Assessment & Plan:

## 2013-10-07 NOTE — Patient Instructions (Signed)
Scoliosis  Scoliosis is the name given to a spine that curves sideways.Scoliosis can cause twisting of your shoulders, hips, chest, back, and rib cage.   CAUSES   The cause of scoliosis is not always known. It may be caused by a birth defect or by a disease that can cause muscular dysfunction and imbalance, such as cerebral palsy and muscular dystrophy.   RISK FACTORS  Having a disease that causes muscle disease or dysfunction.  SIGNS AND SYMPTOMS  Scoliosis often has no signs or symptoms.If they are present, they may include:   Unequal size of one body side compared to the other (asymmetry).   Visible curvature of the spine.   Pain. The pain may limit physical activity.   Shortness of breath.   Bowel or bladder issues.  DIAGNOSIS  A skilled health care provider will perform an evaluation. This will involve:   Taking your history.   Performing a physical examination.   Performing neurological exam to detect nerve or muscle function loss.   Range of motion studies on the spine.   X-rays.  An MRI may also be obtained.  TREATMENT   Treatment varies depending on the nature, extent, and severity of the disease. If the curvature is not great, you may need only observation. A brace may be used to prevent scoliosis from progressing. A brace may also be needed during growth spurts. Physical therapy may be of benefit. Surgery may be required.   HOME CARE INSTRUCTIONS    Your health care provider may suggest exercises to strengthen your muscles. Perform them as directed.   Ask your health care provider before participating in any sports.    If you have been prescribed an orthopedic brace, wear it as instructed by your health care provider.  SEEK MEDICAL CARE IF:  Your brace causes the skin to become sore (chafe) or is uncomfortable.   SEEK IMMEDIATE MEDICAL CARE IF:   You have back pain that is not relieved by the medicines prescribed by your health care provider.    Your legs feel weak or you lose function  in your legs.   You lose some bowel or bladder control.   Document Released: 08/26/2000 Document Revised: 06/19/2013 Document Reviewed: 05/06/2013  [employer] Patient Information 2014 [employer].

## 2013-10-07 NOTE — Progress Notes (Signed)
Pre visit review using our clinic review tool, if applicable. No additional management support is needed unless otherwise documented below in the visit note. 

## 2013-12-16 ENCOUNTER — Encounter: Payer: Self-pay | Admitting: Nurse Practitioner

## 2013-12-16 ENCOUNTER — Ambulatory Visit (INDEPENDENT_AMBULATORY_CARE_PROVIDER_SITE_OTHER): Payer: Managed Care, Other (non HMO) | Admitting: Nurse Practitioner

## 2013-12-16 VITALS — BP 126/84 | HR 64 | Ht 59.0 in | Wt 110.0 lb

## 2013-12-16 DIAGNOSIS — Z01419 Encounter for gynecological examination (general) (routine) without abnormal findings: Secondary | ICD-10-CM

## 2013-12-16 DIAGNOSIS — Z0000 Encounter for general adult medical examination without abnormal findings: Secondary | ICD-10-CM

## 2013-12-16 LAB — POCT URINALYSIS DIPSTICK
Bilirubin, UA: NEGATIVE
Glucose, UA: NEGATIVE
LEUKOCYTES UA: NEGATIVE
NITRITE UA: NEGATIVE
PH UA: 5
PROTEIN UA: NEGATIVE
RBC UA: NEGATIVE
UROBILINOGEN UA: NEGATIVE

## 2013-12-16 LAB — HEMOGLOBIN, FINGERSTICK: Hemoglobin, fingerstick: 12.3 g/dL (ref 12.0–16.0)

## 2013-12-16 MED ORDER — NORETHIN ACE-ETH ESTRAD-FE 1-20 MG-MCG PO TABS: 1.0000 | ORAL_TABLET | Freq: Every day | ORAL | Status: DC

## 2013-12-16 NOTE — Patient Instructions (Signed)

## 2013-12-16 NOTE — Progress Notes (Signed)
Patient ID: Nicole PartridgeLisa Blackburn, female   DOB: 1961/01/30, 53 y.o.   MRN: [id number] 53 y.o. [id number] Married Caucasian Fe here for annual exam.  Menses are 1-2 days. Occasionally misses a month. Some vaso symptoms at times at night.  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  Patient's last menstrual period was 11/15/2013.          Sexually active: yes  The current method of family planning is OCP (estrogen/progesterone).    Exercising: yes  Home exercise routine includes elliptical machine 2-3 times per week. Smoker:  no  Health Maintenance: Pap:  12/06/11, WNL, neg HR HPV MMG:  05/29/13, Bi-Rads 1: negative Colonoscopy:  02/2012, repeat in 7 years due to family history TDaP:  06/08/09 Labs:  HB:12.3  Urine:  Trace ketones, others negative - has not eaten since early am.   reports that she has never smoked. She has never used smokeless tobacco. She reports that she drinks about 1.0 ounces of alcohol per week. She reports that she does not use illicit drugs.  Past Medical History  Diagnosis Date  . Abnormal pap 1997/1998    cryo  . Anemia     Past Surgical History  Procedure Laterality Date  . Refractive surgery Bilateral 2012    Dr Nile RiggsShapiro  . Wisdom tooth extraction  age 53's    Current Outpatient Prescriptions  Medication Sig Dispense Refill  . Calcium Carbonate-Vit D-Min (CALCIUM 1200 PO) Take by mouth.      . cholecalciferol (VITAMIN D) 1000 UNITS tablet Take 1,000 Units by mouth daily.      Marland Kitchen. FIBER PO Take by mouth.      . Multiple Vitamins-Minerals (MULTIVITAMIN PO) Take by mouth.      . norethindrone-ethinyl estradiol (JUNEL FE,GILDESS FE,LOESTRIN FE) 1-20 MG-MCG tablet Take 1 tablet by mouth daily.  3 Package  3   No current facility-administered medications for this visit.    Family History  Problem Relation Age of Onset  . COPD Mother   . Diabetes Father   . Hypertension Father   . Colon polyps Father   . Hypertension Sister    . Hypertension Brother   . Stroke Maternal Grandmother 60  . Cancer Paternal Grandmother [id number]    multiple myloma  . Cancer Paternal Grandfather     ? kind cancer    ROS:  Pertinent items are noted in HPI.  Otherwise, a comprehensive ROS was negative.  Exam:   BP 126/84  Pulse 64  Ht 4\' 11"  (1.499 m)  Wt 110 lb (49.896 kg)  BMI 22.21 kg/m2  LMP 11/15/2013 Height: 4\' 11"  (149.9 cm)  Ht Readings from Last 3 Encounters:  12/16/13 4\' 11"  (1.499 m)  12/13/12 4' 11.25" (1.505 m)  12/11/12 4' 11.5" (1.511 m)    General appearance: alert, cooperative and appears stated age Head: Normocephalic, without obvious abnormality, atraumatic Neck: no adenopathy, supple, symmetrical, trachea midline and thyroid normal to inspection and palpation Lungs: clear to auscultation bilaterally Back : scoliosis of thoracic to lumbar spine Breasts: normal appearance, no masses or tenderness Heart: regular rate and rhythm Abdomen: soft, non-tender; no masses,  no organomegaly Extremities: extremities normal, atraumatic, no cyanosis or edema Skin: Skin color, texture, turgor normal. No rashes or lesions Lymph nodes: Cervical, supraclavicular, and axillary nodes normal. No abnormal inguinal nodes palpated Neurologic: Grossly normal   Pelvic: External genitalia:  no lesions  Urethra:  normal appearing urethra with no masses, tenderness or lesions              Bartholin's and Skene's: normal                 Vagina: normal appearing vagina with normal color and discharge, no lesions              Cervix: anteverted              Pap taken: no Bimanual Exam:  Uterus:  normal size, contour, position, consistency, mobility, non-tender              Adnexa: no mass, fullness, tenderness               Rectovaginal: Confirms               Anus:  normal sphincter tone, no lesions  A:  Well Woman with normal exam  Contraception with OCP  Remote history of abnormal pap 1997/1998    P:   Pap smear  as per guidelines   Mammogram due 9/15  Refill OCP  Loestrin 1/20 for a year - discussed coming off the pill now vs. next year - she prefers to wait.  If no menses in 3 months to call back.  Counseled on breast self exam, mammography screening, use and side effects of OCP's, adequate intake of calcium and vitamin D, diet and exercise return annually or prn  An After Visit Summary was printed and given to the patient.

## 2013-12-17 NOTE — Progress Notes (Signed)
Encounter reviewed by Dr. Brook Silva.  

## 2014-03-05 ENCOUNTER — Encounter: Payer: Self-pay | Admitting: Gastroenterology

## 2014-04-14 ENCOUNTER — Ambulatory Visit: Payer: Managed Care, Other (non HMO) | Admitting: Critical Care Medicine

## 2014-05-02 ENCOUNTER — Encounter: Payer: Self-pay | Admitting: Internal Medicine

## 2014-05-02 ENCOUNTER — Ambulatory Visit (INDEPENDENT_AMBULATORY_CARE_PROVIDER_SITE_OTHER): Payer: Managed Care, Other (non HMO) | Admitting: Internal Medicine

## 2014-05-02 VITALS — BP 104/74 | HR 84 | Temp 98.5°F | Wt 106.4 lb

## 2014-05-02 DIAGNOSIS — A09 Infectious gastroenteritis and colitis, unspecified: Secondary | ICD-10-CM

## 2014-05-02 MED ORDER — CIPROFLOXACIN HCL 500 MG PO TABS
500.0000 mg | ORAL_TABLET | Freq: Two times a day (BID) | ORAL | Status: DC
Start: 2014-05-02 — End: 2014-07-14

## 2014-05-02 NOTE — Patient Instructions (Signed)
Drink plenty of clear fluids Ciprofloxacin x 3 days OTC PeptoBismol as needed for diarrhea  Call if you are not gradually improving in few days Call anytime if you feel worse, you have fever, chills, blood in the stools.

## 2014-05-02 NOTE — Progress Notes (Signed)
   Subjective:    Patient ID: Nicole PartridgeLisa Blackburn, female    DOB: 1961-02-28, 53 y.o.   MRN: [id number]  DOS:  05/02/2014 Type of visit - description: acute History: Patient came back from [place] 5 days ago, the next day she developed watery diarrhea, lower >upper  abdominal cramps. While n [place] she did take sanitary precautions and felt well. Other family members are not affected. Feeling slightly weak and felt orthostatic one time only. She has been drinking plenty of fluids   ROS Denies fever chills No blood in the stools or mucus in the stools  Past Medical History  Diagnosis Date  . Abnormal pap 1997/1998    cryo  . Anemia     Past Surgical History  Procedure Laterality Date  . Refractive surgery Bilateral 2012    Dr Nile RiggsShapiro  . Wisdom tooth extraction  age 53's    History   Social History  . Marital Status: Married    Spouse Name: N/A    Number of Children: N/A  . Years of Education: N/A   Occupational History  . [employer]--director marketing    Social History Main Topics  . Smoking status: Never Smoker   . Smokeless tobacco: Never Used  . Alcohol Use: 1.0 oz/week    2 drink(s) per week     Comment: Socially  . Drug Use: No  . Sexual Activity: Yes    Partners: Male    Birth Control/ Protection: Pill   Other Topics Concern  . Not on file   Social History Narrative   Exercise-- elliptical        Medication List       This list is accurate as of: 05/02/14 11:59 PM.  Always use your most recent med list.               CALCIUM 1200 PO  Take by mouth.     cholecalciferol 1000 UNITS tablet  Commonly known as:  VITAMIN D  Take 1,000 Units by mouth daily.     ciprofloxacin 500 MG tablet  Commonly known as:  CIPRO  Take 1 tablet (500 mg total) by mouth 2 (two) times daily.     FIBER PO  Take by mouth.     MULTIVITAMIN PO  Take by mouth.     norethindrone-ethinyl estradiol 1-20 MG-MCG tablet  Commonly known as:  JUNEL FE,GILDESS FE,LOESTRIN  FE  Take 1 tablet by mouth daily.           Objective:   Physical Exam BP 104/74  Pulse 84  Temp(Src) 98.5 F (36.9 C) (Oral)  Wt 106 lb 6 oz (48.251 kg)  SpO2 99%  LMP 03/31/2014 General -- alert, well-developed, NAD.   Abdomen-- Not distended, slt increased  bowel sounds,soft, non-tender. No rebound or rigidity.  Extremities-- no pretibial edema bilaterally  Neurologic--  alert & oriented X3. Speech normal, gait appropriate for age, strength symmetric and appropriate for age.   Psych-- Cognition and judgment appear intact. Cooperative with normal attention span and concentration. No anxious or depressed appearing.        Assessment & Plan:   Travelers diarrhea, Diarrhea most likely related to recent trip to [place], no red flag symptoms, plan: Cipro, fluids (counsled--soup-gatorade) , Pepto-Bismol. If not gradually improving knows to call the office

## 2014-05-02 NOTE — Progress Notes (Signed)
Pre-visit discussion using our clinic review tool. No additional management support is needed unless otherwise documented below in the visit note.  

## 2014-05-14 ENCOUNTER — Ambulatory Visit: Payer: Managed Care, Other (non HMO) | Admitting: Critical Care Medicine

## 2014-07-14 ENCOUNTER — Encounter: Payer: Self-pay | Admitting: Family Medicine

## 2014-07-14 ENCOUNTER — Ambulatory Visit (INDEPENDENT_AMBULATORY_CARE_PROVIDER_SITE_OTHER): Payer: Managed Care, Other (non HMO) | Admitting: Family Medicine

## 2014-07-14 ENCOUNTER — Ambulatory Visit (HOSPITAL_BASED_OUTPATIENT_CLINIC_OR_DEPARTMENT_OTHER)
Admission: RE | Admit: 2014-07-14 | Discharge: 2014-07-14 | Disposition: A | Discharge status: Home / Self Care | Payer: Managed Care, Other (non HMO) | Source: Ambulatory Visit | Attending: Family Medicine | Admitting: Family Medicine

## 2014-07-14 VITALS — BP 118/68 | HR 68 | Temp 98.5°F | Ht 59.5 in | Wt 109.0 lb

## 2014-07-14 DIAGNOSIS — D509 Iron deficiency anemia, unspecified: Secondary | ICD-10-CM | POA: Diagnosis not present

## 2014-07-14 DIAGNOSIS — R059 Cough, unspecified: Secondary | ICD-10-CM

## 2014-07-14 DIAGNOSIS — Z23 Encounter for immunization: Secondary | ICD-10-CM

## 2014-07-14 DIAGNOSIS — R319 Hematuria, unspecified: Secondary | ICD-10-CM

## 2014-07-14 DIAGNOSIS — Z0000 Encounter for general adult medical examination without abnormal findings: Secondary | ICD-10-CM | POA: Diagnosis not present

## 2014-07-14 DIAGNOSIS — R05 Cough: Secondary | ICD-10-CM | POA: Diagnosis not present

## 2014-07-14 DIAGNOSIS — R8290 Unspecified abnormal findings in urine: Secondary | ICD-10-CM

## 2014-07-14 LAB — POCT URINALYSIS DIPSTICK
BILIRUBIN UA: NEGATIVE
Glucose, UA: NEGATIVE
Ketones, UA: NEGATIVE
LEUKOCYTES UA: NEGATIVE
Nitrite, UA: NEGATIVE
PH UA: 6
PROTEIN UA: NEGATIVE
Spec Grav, UA: >=1.03
Urobilinogen, UA: 0.2

## 2014-07-14 MED ORDER — PREDNISONE 10 MG PO TABS: 10.0000 mg | ORAL_TABLET | Freq: Every day | ORAL | Status: DC

## 2014-07-14 NOTE — Addendum Note (Signed)
Addended by: Verdie ShireBAYNES, ANGELA M on: 07/14/2014 04:27 PM   Modules accepted: Orders

## 2014-07-14 NOTE — Progress Notes (Signed)
Subjective:     Nicole PartridgeLisa Blackburn is a 53 y.o. female and is here for a comprehensive physical exam. The patient reports problems - chronic cough-still-- no better-- productive but clear.  History   Social History  . Marital Status: Married    Spouse Name: N/A    Number of Children: N/A  . Years of Education: N/A   Occupational History  . [employer]--director marketing    Social History Main Topics  . Smoking status: Never Smoker   . Smokeless tobacco: Never Used  . Alcohol Use: 1.0 oz/week    2 drink(s) per week     Comment: Socially  . Drug Use: No  . Sexual Activity:    Partners: Male    Birth Control/ Protection: Pill   Other Topics Concern  . Not on file   Social History Narrative   Exercise-- elliptical   Health Maintenance  Topic Date Due  . INFLUENZA VACCINE  04/12/2014  . PAP SMEAR  12/12/2014  . MAMMOGRAM  05/30/2015  . COLONOSCOPY  03/01/2019  . TETANUS/TDAP  06/09/2019    The following portions of the patient's history were reviewed and updated as appropriate:  She  has a past medical history of Abnormal pap (1997/1998) and Anemia. She  does not have any pertinent problems on file. She  has past surgical history that includes Refractive surgery (Bilateral, 2012) and Wisdom tooth extraction (age 53's). Her family history includes COPD in her mother; Cancer in her paternal grandfather; Cancer (age of onset: 7770) in her paternal grandmother; Colon polyps in her father; Diabetes in her father; Hypertension in her brother, father, and sister; Osteoporosis in her mother; Stroke (age of onset: 4860) in her maternal grandmother. She  reports that she has never smoked. She has never used smokeless tobacco. She reports that she drinks about 1.0 oz of alcohol per week. She reports that she does not use illicit drugs. She has a current medication list which includes the following prescription(s): calcium carbonate-vit d-min, cholecalciferol, fiber, multiple vitamins-minerals,  and norethindrone-ethinyl estradiol. Current Outpatient Prescriptions on File Prior to Visit  Medication Sig Dispense Refill  . Calcium Carbonate-Vit D-Min (CALCIUM 1200 PO) Take by mouth.    . cholecalciferol (VITAMIN D) 1000 UNITS tablet Take 1,000 Units by mouth daily.    Marland Kitchen. FIBER PO Take by mouth.    . Multiple Vitamins-Minerals (MULTIVITAMIN PO) Take by mouth.    . norethindrone-ethinyl estradiol (JUNEL FE,GILDESS FE,LOESTRIN FE) 1-20 MG-MCG tablet Take 1 tablet by mouth daily. 3 Package 3   No current facility-administered medications on file prior to visit.   She has No Known Allergies..  Review of Systems Review of Systems  Constitutional: Negative for activity change, appetite change and fatigue.  HENT: Negative for hearing loss, congestion, tinnitus and ear discharge.  dentist [id number] Eyes: Negative for visual disturbance (see optho q1y -- vision corrected to 20/20 with glasses).  Respiratory: Negative for cough, chest tightness and shortness of breath.   Cardiovascular: Negative for chest pain, palpitations and leg swelling.  Gastrointestinal: Negative for abdominal pain, diarrhea, constipation and abdominal distention.  Genitourinary: Negative for urgency, frequency, decreased urine volume and difficulty urinating.  Musculoskeletal: Negative for back pain, arthralgias and gait problem.  Skin: Negative for color change, pallor and rash.  Neurological: Negative for dizziness, light-headedness, numbness and headaches.  Hematological: Negative for adenopathy. Does not bruise/bleed easily.  Psychiatric/Behavioral: Negative for suicidal ideas, confusion, sleep disturbance, self-injury, dysphoric mood, decreased concentration and agitation.  Objective:    BP 118/68 mmHg  Pulse 68  Temp(Src) 98.5 F (36.9 C) (Oral)  Ht 4' 11.5" (1.511 m)  Wt 109 lb (49.442 kg)  BMI 21.66 kg/m2  SpO2 98% General appearance: alert, cooperative, appears stated age and no distress Head:  Normocephalic, without obvious abnormality, atraumatic Eyes: conjunctivae/corneas clear. PERRL, EOM's intact. Fundi benign. Ears: normal TM's and external ear canals both ears Nose: Nares normal. Septum midline. Mucosa normal. No drainage or sinus tenderness. Throat: abnormal findings: pnd Neck: no adenopathy, supple, symmetrical, trachea midline and thyroid not enlarged, symmetric, no tenderness/mass/nodules Back: + scoliosis Lungs: clear to auscultation bilaterally Breasts: gyn Heart: S1, S2 normal Abdomen: soft, non-tender; bowel sounds normal; no masses,  no organomegaly Pelvic: deferred--gyn Extremities: extremities normal, atraumatic, no cyanosis or edema Pulses: 2+ and symmetric Skin: Skin color, texture, turgor normal. No rashes or lesions Lymph nodes: Cervical, supraclavicular, and axillary nodes normal. Neurologic: Alert and oriented X 3, normal strength and tone. Normal symmetric reflexes. Normal coordination and gait Psych--no depression, no anxiety      Assessment:    Healthy female exam.       Plan:     1. Iron deficiency anemia  - CBC with Differential - IBC panel - Ferritin; Future  2. Preventative health care ghm utd, check labs - Basic metabolic panel - CBC with Differential - Hepatic function panel - Lipid panel - POCT urinalysis dipstick - TSH  3. Need for prophylactic vaccination and inoculation against influenza  - Flu Vaccine QUAD 36+ mos PF IM (Fluarix Quad PF)  4. Cough  - DG Chest 2 View; Future - predniSONE (DELTASONE) 10 MG tablet; Take 1 tablet (10 mg total) by mouth daily with breakfast.  Dispense: 7 tablet; Refill: 0 See After Visit Summary for Counseling Recommendations   Sugar free hard candy Antihistamine prilosec F/u prn

## 2014-07-14 NOTE — Progress Notes (Signed)
Pre visit review using our clinic review tool, if applicable. No additional management support is needed unless otherwise documented below in the visit note. 

## 2014-07-14 NOTE — Patient Instructions (Addendum)
Antihistamine --ie zyrtec or chlortrimeton,   prilosec otc Sugar free jolly ranchers  Preventive Care for Adults A healthy lifestyle and preventive care can promote health and wellness. Preventive health guidelines for women include the following key practices.  A routine yearly physical is a good way to check with your health care provider about your health and preventive screening. It is a chance to share any concerns and updates on your health and to receive a thorough exam.  Visit your dentist for a routine exam and preventive care every 6 months. Brush your teeth twice a day and floss once a day. Good oral hygiene prevents tooth decay and gum disease.  The frequency of eye exams is based on your age, health, family medical history, use of contact lenses, and other factors. Follow your health care provider's recommendations for frequency of eye exams.  Eat a healthy diet. Foods like vegetables, fruits, whole grains, low-fat dairy products, and lean protein foods contain the nutrients you need without too many calories. Decrease your intake of foods high in solid fats, added sugars, and salt. Eat the right amount of calories for you.Get information about a proper diet from your health care provider, if necessary.  Regular physical exercise is one of the most important things you can do for your health. Most adults should get at least 150 minutes of moderate-intensity exercise (any activity that increases your heart rate and causes you to sweat) each week. In addition, most adults need muscle-strengthening exercises on 2 or more days a week.  Maintain a healthy weight. The body mass index (BMI) is a screening tool to identify possible weight problems. It provides an estimate of body fat based on height and weight. Your health care provider can find your BMI and can help you achieve or maintain a healthy weight.For adults 20 years and older:  A BMI below 18.5 is considered underweight.  A BMI  of 18.5 to 24.9 is normal.  A BMI of 25 to 29.9 is considered overweight.  A BMI of 30 and above is considered obese.  Maintain normal blood lipids and cholesterol levels by exercising and minimizing your intake of saturated fat. Eat a balanced diet with plenty of fruit and vegetables. Blood tests for lipids and cholesterol should begin at age 8 and be repeated every 5 years. If your lipid or cholesterol levels are high, you are over 50, or you are at high risk for heart disease, you may need your cholesterol levels checked more frequently.Ongoing high lipid and cholesterol levels should be treated with medicines if diet and exercise are not working.  If you smoke, find out from your health care provider how to quit. If you do not use tobacco, do not start.  Lung cancer screening is recommended for adults aged 71-80 years who are at high risk for developing lung cancer because of a history of smoking. A yearly low-dose CT scan of the lungs is recommended for people who have at least a 30-pack-year history of smoking and are a current smoker or have quit within the past 15 years. A pack year of smoking is smoking an average of 1 pack of cigarettes a day for 1 year (for example: 1 pack a day for 30 years or 2 packs a day for 15 years). Yearly screening should continue until the smoker has stopped smoking for at least 15 years. Yearly screening should be stopped for people who develop a health problem that would prevent them from having lung cancer  treatment.  If you are pregnant, do not drink alcohol. If you are breastfeeding, be very cautious about drinking alcohol. If you are not pregnant and choose to drink alcohol, do not have more than 1 drink per day. One drink is considered to be 12 ounces (355 mL) of beer, 5 ounces (148 mL) of wine, or 1.5 ounces (44 mL) of liquor.  Avoid use of street drugs. Do not share needles with anyone. Ask for help if you need support or instructions about stopping the  use of drugs.  High blood pressure causes heart disease and increases the risk of stroke. Your blood pressure should be checked at least every 1 to 2 years. Ongoing high blood pressure should be treated with medicines if weight loss and exercise do not work.  If you are 5-37 years old, ask your health care provider if you should take aspirin to prevent strokes.  Diabetes screening involves taking a blood sample to check your fasting blood sugar level. This should be done once every 3 years, after age 41, if you are within normal weight and without risk factors for diabetes. Testing should be considered at a younger age or be carried out more frequently if you are overweight and have at least 1 risk factor for diabetes.  Breast cancer screening is essential preventive care for women. You should practice "breast self-awareness." This means understanding the normal appearance and feel of your breasts and may include breast self-examination. Any changes detected, no matter how small, should be reported to a health care provider. Women in their 42s and 30s should have a clinical breast exam (CBE) by a health care provider as part of a regular health exam every 1 to 3 years. After age 9, women should have a CBE every year. Starting at age 78, women should consider having a mammogram (breast X-ray test) every year. Women who have a family history of breast cancer should talk to their health care provider about genetic screening. Women at a high risk of breast cancer should talk to their health care providers about having an MRI and a mammogram every year.  Breast cancer gene (BRCA)-related cancer risk assessment is recommended for women who have family members with BRCA-related cancers. BRCA-related cancers include breast, ovarian, tubal, and peritoneal cancers. Having family members with these cancers may be associated with an increased risk for harmful changes (mutations) in the breast cancer genes BRCA1 and  BRCA2. Results of the assessment will determine the need for genetic counseling and BRCA1 and BRCA2 testing.  Routine pelvic exams to screen for cancer are no longer recommended for nonpregnant women who are considered low risk for cancer of the pelvic organs (ovaries, uterus, and vagina) and who do not have symptoms. Ask your health care provider if a screening pelvic exam is right for you.  If you have had past treatment for cervical cancer or a condition that could lead to cancer, you need Pap tests and screening for cancer for at least 20 years after your treatment. If Pap tests have been discontinued, your risk factors (such as having a new sexual partner) need to be reassessed to determine if screening should be resumed. Some women have medical problems that increase the chance of getting cervical cancer. In these cases, your health care provider may recommend more frequent screening and Pap tests.  The HPV test is an additional test that may be used for cervical cancer screening. The HPV test looks for the virus that can cause the  cell changes on the cervix. The cells collected during the Pap test can be tested for HPV. The HPV test could be used to screen women aged 57 years and older, and should be used in women of any age who have unclear Pap test results. After the age of 74, women should have HPV testing at the same frequency as a Pap test.  Colorectal cancer can be detected and often prevented. Most routine colorectal cancer screening begins at the age of 51 years and continues through age 69 years. However, your health care provider may recommend screening at an earlier age if you have risk factors for colon cancer. On a yearly basis, your health care provider may provide home test kits to check for hidden blood in the stool. Use of a small camera at the end of a tube, to directly examine the colon (sigmoidoscopy or colonoscopy), can detect the earliest forms of colorectal cancer. Talk to your  health care provider about this at age 45, when routine screening begins. Direct exam of the colon should be repeated every 5-10 years through age 76 years, unless early forms of pre-cancerous polyps or small growths are found.  People who are at an increased risk for hepatitis B should be screened for this virus. You are considered at high risk for hepatitis B if:  You were born in a country where hepatitis B occurs often. Talk with your health care provider about which countries are considered high risk.  Your parents were born in a high-risk country and you have not received a shot to protect against hepatitis B (hepatitis B vaccine).  You have HIV or AIDS.  You use needles to inject street drugs.  You live with, or have sex with, someone who has hepatitis B.  You get hemodialysis treatment.  You take certain medicines for conditions like cancer, organ transplantation, and autoimmune conditions.  Hepatitis C blood testing is recommended for all people born from [birth year] through 1965 and any individual with known risks for hepatitis C.  Practice safe sex. Use condoms and avoid high-risk sexual practices to reduce the spread of sexually transmitted infections (STIs). STIs include gonorrhea, chlamydia, syphilis, trichomonas, herpes, HPV, and human immunodeficiency virus (HIV). Herpes, HIV, and HPV are viral illnesses that have no cure. They can result in disability, cancer, and death.  You should be screened for sexually transmitted illnesses (STIs) including gonorrhea and chlamydia if:  You are sexually active and are younger than 24 years.  You are older than 24 years and your health care provider tells you that you are at risk for this type of infection.  Your sexual activity has changed since you were last screened and you are at an increased risk for chlamydia or gonorrhea. Ask your health care provider if you are at risk.  If you are at risk of being infected with HIV, it is  recommended that you take a prescription medicine daily to prevent HIV infection. This is called preexposure prophylaxis (PrEP). You are considered at risk if:  You are a heterosexual woman, are sexually active, and are at increased risk for HIV infection.  You take drugs by injection.  You are sexually active with a partner who has HIV.  Talk with your health care provider about whether you are at high risk of being infected with HIV. If you choose to begin PrEP, you should first be tested for HIV. You should then be tested every 3 months for as long as you are taking  PrEP.  Osteoporosis is a disease in which the bones lose minerals and strength with aging. This can result in serious bone fractures or breaks. The risk of osteoporosis can be identified using a bone density scan. Women ages 11 years and over and women at risk for fractures or osteoporosis should discuss screening with their health care providers. Ask your health care provider whether you should take a calcium supplement or vitamin D to reduce the rate of osteoporosis.  Menopause can be associated with physical symptoms and risks. Hormone replacement therapy is available to decrease symptoms and risks. You should talk to your health care provider about whether hormone replacement therapy is right for you.  Use sunscreen. Apply sunscreen liberally and repeatedly throughout the day. You should seek shade when your shadow is shorter than you. Protect yourself by wearing long sleeves, pants, a wide-brimmed hat, and sunglasses year round, whenever you are outdoors.  Once a month, do a whole body skin exam, using a mirror to look at the skin on your back. Tell your health care provider of new moles, moles that have irregular borders, moles that are larger than a pencil eraser, or moles that have changed in shape or color.  Stay current with required vaccines (immunizations).  Influenza vaccine. All adults should be immunized every  year.  Tetanus, diphtheria, and acellular pertussis (Td, Tdap) vaccine. Pregnant women should receive 1 dose of Tdap vaccine during each pregnancy. The dose should be obtained regardless of the length of time since the last dose. Immunization is preferred during the 27th-36th week of gestation. An adult who has not previously received Tdap or who does not know her vaccine status should receive 1 dose of Tdap. This initial dose should be followed by tetanus and diphtheria toxoids (Td) booster doses every 10 years. Adults with an unknown or incomplete history of completing a 3-dose immunization series with Td-containing vaccines should begin or complete a primary immunization series including a Tdap dose. Adults should receive a Td booster every 10 years.  Varicella vaccine. An adult without evidence of immunity to varicella should receive 2 doses or a second dose if she has previously received 1 dose. Pregnant females who do not have evidence of immunity should receive the first dose after pregnancy. This first dose should be obtained before leaving the health care facility. The second dose should be obtained 4-8 weeks after the first dose.  Human papillomavirus (HPV) vaccine. Females aged 13-26 years who have not received the vaccine previously should obtain the 3-dose series. The vaccine is not recommended for use in pregnant females. However, pregnancy testing is not needed before receiving a dose. If a female is found to be pregnant after receiving a dose, no treatment is needed. In that case, the remaining doses should be delayed until after the pregnancy. Immunization is recommended for any person with an immunocompromised condition through the age of 38 years if she did not get any or all doses earlier. During the 3-dose series, the second dose should be obtained 4-8 weeks after the first dose. The third dose should be obtained 24 weeks after the first dose and 16 weeks after the second dose.  Zoster  vaccine. One dose is recommended for adults aged 79 years or older unless certain conditions are present.  Measles, mumps, and rubella (MMR) vaccine. Adults born before [birth year] generally are considered immune to measles and mumps. Adults born in [birth year] or later should have 1 or more doses of MMR vaccine unless there  is a contraindication to the vaccine or there is laboratory evidence of immunity to each of the three diseases. A routine second dose of MMR vaccine should be obtained at least 28 days after the first dose for students attending postsecondary schools, health care workers, or international travelers. People who received inactivated measles vaccine or an unknown type of measles vaccine during 1963-1967 should receive 2 doses of MMR vaccine. People who received inactivated mumps vaccine or an unknown type of mumps vaccine before 1979 and are at high risk for mumps infection should consider immunization with 2 doses of MMR vaccine. For females of childbearing age, rubella immunity should be determined. If there is no evidence of immunity, females who are not pregnant should be vaccinated. If there is no evidence of immunity, females who are pregnant should delay immunization until after pregnancy. Unvaccinated health care workers born before [birth year] who lack laboratory evidence of measles, mumps, or rubella immunity or laboratory confirmation of disease should consider measles and mumps immunization with 2 doses of MMR vaccine or rubella immunization with 1 dose of MMR vaccine.  Pneumococcal 13-valent conjugate (PCV13) vaccine. When indicated, a person who is uncertain of her immunization history and has no record of immunization should receive the PCV13 vaccine. An adult aged 20 years or older who has certain medical conditions and has not been previously immunized should receive 1 dose of PCV13 vaccine. This PCV13 should be followed with a dose of pneumococcal polysaccharide (PPSV23) vaccine. The PPSV23  vaccine dose should be obtained at least 8 weeks after the dose of PCV13 vaccine. An adult aged 42 years or older who has certain medical conditions and previously received 1 or more doses of PPSV23 vaccine should receive 1 dose of PCV13. The PCV13 vaccine dose should be obtained 1 or more years after the last PPSV23 vaccine dose.  Pneumococcal polysaccharide (PPSV23) vaccine. When PCV13 is also indicated, PCV13 should be obtained first. All adults aged 33 years and older should be immunized. An adult younger than age 23 years who has certain medical conditions should be immunized. Any person who resides in a nursing home or long-term care facility should be immunized. An adult smoker should be immunized. People with an immunocompromised condition and certain other conditions should receive both PCV13 and PPSV23 vaccines. People with human immunodeficiency virus (HIV) infection should be immunized as soon as possible after diagnosis. Immunization during chemotherapy or radiation therapy should be avoided. Routine use of PPSV23 vaccine is not recommended for American Indians, [place] Natives, or people younger than 65 years unless there are medical conditions that require PPSV23 vaccine. When indicated, people who have unknown immunization and have no record of immunization should receive PPSV23 vaccine. One-time revaccination 5 years after the first dose of PPSV23 is recommended for people aged 19-64 years who have chronic kidney failure, nephrotic syndrome, asplenia, or immunocompromised conditions. People who received 1-2 doses of PPSV23 before age 61 years should receive another dose of PPSV23 vaccine at age 14 years or later if at least 5 years have passed since the previous dose. Doses of PPSV23 are not needed for people immunized with PPSV23 at or after age 66 years.  Meningococcal vaccine. Adults with asplenia or persistent complement component deficiencies should receive 2 doses of quadrivalent  meningococcal conjugate (MenACWY-D) vaccine. The doses should be obtained at least 2 months apart. Microbiologists working with certain meningococcal bacteria, [employer] recruits, people at risk during an outbreak, and people who travel to or live in countries with a  high rate of meningitis should be immunized. A first-year college student up through age 46 years who is living in a residence hall should receive a dose if she did not receive a dose on or after her 16th birthday. Adults who have certain high-risk conditions should receive one or more doses of vaccine.  Hepatitis A vaccine. Adults who wish to be protected from this disease, have certain high-risk conditions, work with hepatitis A-infected animals, work in hepatitis A research labs, or travel to or work in countries with a high rate of hepatitis A should be immunized. Adults who were previously unvaccinated and who anticipate close contact with an international adoptee during the first 60 days after arrival in the [place] from a country with a high rate of hepatitis A should be immunized.  Hepatitis B vaccine. Adults who wish to be protected from this disease, have certain high-risk conditions, may be exposed to blood or other infectious body fluids, are household contacts or sex partners of hepatitis B positive people, are clients or workers in certain care facilities, or travel to or work in countries with a high rate of hepatitis B should be immunized.  Haemophilus influenzae type b (Hib) vaccine. A previously unvaccinated person with asplenia or sickle cell disease or having a scheduled splenectomy should receive 1 dose of Hib vaccine. Regardless of previous immunization, a recipient of a hematopoietic stem cell transplant should receive a 3-dose series 6-12 months after her successful transplant. Hib vaccine is not recommended for adults with HIV infection. Preventive Services / Frequency Ages 65 to 73 years  Blood pressure check.**  / Every 1 to 2 years.  Lipid and cholesterol check.** / Every 5 years beginning at age 16.  Clinical breast exam.** / Every 3 years for women in their 46s and 42s.  BRCA-related cancer risk assessment.** / For women who have family members with a BRCA-related cancer (breast, ovarian, tubal, or peritoneal cancers).  Pap test.** / Every 2 years from ages 75 through 54. Every 3 years starting at age 60 through age 41 or 50 with a history of 3 consecutive normal Pap tests.  HPV screening.** / Every 3 years from ages 42 through ages 75 to 45 with a history of 3 consecutive normal Pap tests.  Hepatitis C blood test.** / For any individual with known risks for hepatitis C.  Skin self-exam. / Monthly.  Influenza vaccine. / Every year.  Tetanus, diphtheria, and acellular pertussis (Tdap, Td) vaccine.** / Consult your health care provider. Pregnant women should receive 1 dose of Tdap vaccine during each pregnancy. 1 dose of Td every 10 years.  Varicella vaccine.** / Consult your health care provider. Pregnant females who do not have evidence of immunity should receive the first dose after pregnancy.  HPV vaccine. / 3 doses over 6 months, if 58 and younger. The vaccine is not recommended for use in pregnant females. However, pregnancy testing is not needed before receiving a dose.  Measles, mumps, rubella (MMR) vaccine.** / You need at least 1 dose of MMR if you were born in 1957 or later. You may also need a 2nd dose. For females of childbearing age, rubella immunity should be determined. If there is no evidence of immunity, females who are not pregnant should be vaccinated. If there is no evidence of immunity, females who are pregnant should delay immunization until after pregnancy.  Pneumococcal 13-valent conjugate (PCV13) vaccine.** / Consult your health care provider.  Pneumococcal polysaccharide (PPSV23) vaccine.** / 1 to 2  doses if you smoke cigarettes or if you have certain  conditions.  Meningococcal vaccine.** / 1 dose if you are age 88 to 25 years and a Market researcher living in a residence hall, or have one of several medical conditions, you need to get vaccinated against meningococcal disease. You may also need additional booster doses.  Hepatitis A vaccine.** / Consult your health care provider.  Hepatitis B vaccine.** / Consult your health care provider.  Haemophilus influenzae type b (Hib) vaccine.** / Consult your health care provider. Ages 55 to 108 years  Blood pressure check.** / Every 1 to 2 years.  Lipid and cholesterol check.** / Every 5 years beginning at age 62 years.  Lung cancer screening. / Every year if you are aged 63-80 years and have a 30-pack-year history of smoking and currently smoke or have quit within the past 15 years. Yearly screening is stopped once you have quit smoking for at least 15 years or develop a health problem that would prevent you from having lung cancer treatment.  Clinical breast exam.** / Every year after age 52 years.  BRCA-related cancer risk assessment.** / For women who have family members with a BRCA-related cancer (breast, ovarian, tubal, or peritoneal cancers).  Mammogram.** / Every year beginning at age 62 years and continuing for as long as you are in good health. Consult with your health care provider.  Pap test.** / Every 3 years starting at age 47 years through age 61 or 65 years with a history of 3 consecutive normal Pap tests.  HPV screening.** / Every 3 years from ages 96 years through ages 9 to 43 years with a history of 3 consecutive normal Pap tests.  Fecal occult blood test (FOBT) of stool. / Every year beginning at age 41 years and continuing until age 59 years. You may not need to do this test if you get a colonoscopy every 10 years.  Flexible sigmoidoscopy or colonoscopy.** / Every 5 years for a flexible sigmoidoscopy or every 10 years for a colonoscopy beginning at age 74 years  and continuing until age 55 years.  Hepatitis C blood test.** / For all people born from [birth year] through 1965 and any individual with known risks for hepatitis C.  Skin self-exam. / Monthly.  Influenza vaccine. / Every year.  Tetanus, diphtheria, and acellular pertussis (Tdap/Td) vaccine.** / Consult your health care provider. Pregnant women should receive 1 dose of Tdap vaccine during each pregnancy. 1 dose of Td every 10 years.  Varicella vaccine.** / Consult your health care provider. Pregnant females who do not have evidence of immunity should receive the first dose after pregnancy.  Zoster vaccine.** / 1 dose for adults aged 21 years or older.  Measles, mumps, rubella (MMR) vaccine.** / You need at least 1 dose of MMR if you were born in 1957 or later. You may also need a 2nd dose. For females of childbearing age, rubella immunity should be determined. If there is no evidence of immunity, females who are not pregnant should be vaccinated. If there is no evidence of immunity, females who are pregnant should delay immunization until after pregnancy.  Pneumococcal 13-valent conjugate (PCV13) vaccine.** / Consult your health care provider.  Pneumococcal polysaccharide (PPSV23) vaccine.** / 1 to 2 doses if you smoke cigarettes or if you have certain conditions.  Meningococcal vaccine.** / Consult your health care provider.  Hepatitis A vaccine.** / Consult your health care provider.  Hepatitis B vaccine.** / Consult your health care provider.  Haemophilus influenzae type b (Hib) vaccine.** / Consult your health care provider. Ages 64 years and over  Blood pressure check.** / Every 1 to 2 years.  Lipid and cholesterol check.** / Every 5 years beginning at age 21 years.  Lung cancer screening. / Every year if you are aged 51-80 years and have a 30-pack-year history of smoking and currently smoke or have quit within the past 15 years. Yearly screening is stopped once you have quit smoking  for at least 15 years or develop a health problem that would prevent you from having lung cancer treatment.  Clinical breast exam.** / Every year after age 25 years.  BRCA-related cancer risk assessment.** / For women who have family members with a BRCA-related cancer (breast, ovarian, tubal, or peritoneal cancers).  Mammogram.** / Every year beginning at age 71 years and continuing for as long as you are in good health. Consult with your health care provider.  Pap test.** / Every 3 years starting at age 76 years through age 61 or 47 years with 3 consecutive normal Pap tests. Testing can be stopped between 65 and 70 years with 3 consecutive normal Pap tests and no abnormal Pap or HPV tests in the past 10 years.  HPV screening.** / Every 3 years from ages 67 years through ages 45 or 54 years with a history of 3 consecutive normal Pap tests. Testing can be stopped between 65 and 70 years with 3 consecutive normal Pap tests and no abnormal Pap or HPV tests in the past 10 years.  Fecal occult blood test (FOBT) of stool. / Every year beginning at age 88 years and continuing until age 63 years. You may not need to do this test if you get a colonoscopy every 10 years.  Flexible sigmoidoscopy or colonoscopy.** / Every 5 years for a flexible sigmoidoscopy or every 10 years for a colonoscopy beginning at age 70 years and continuing until age 30 years.  Hepatitis C blood test.** / For all people born from [birth year] through 1965 and any individual with known risks for hepatitis C.  Osteoporosis screening.** / A one-time screening for women ages 58 years and over and women at risk for fractures or osteoporosis.  Skin self-exam. / Monthly.  Influenza vaccine. / Every year.  Tetanus, diphtheria, and acellular pertussis (Tdap/Td) vaccine.** / 1 dose of Td every 10 years.  Varicella vaccine.** / Consult your health care provider.  Zoster vaccine.** / 1 dose for adults aged 55 years or older.  Pneumococcal  13-valent conjugate (PCV13) vaccine.** / Consult your health care provider.  Pneumococcal polysaccharide (PPSV23) vaccine.** / 1 dose for all adults aged 32 years and older.  Meningococcal vaccine.** / Consult your health care provider.  Hepatitis A vaccine.** / Consult your health care provider.  Hepatitis B vaccine.** / Consult your health care provider.  Haemophilus influenzae type b (Hib) vaccine.** / Consult your health care provider. ** Family history and personal history of risk and conditions may change your health care provider's recommendations. Document Released: 10/25/2001 Document Revised: 01/13/2014 Document Reviewed: 01/24/2011 [facility] Patient Information 2015 [place], [place]. This information is not intended to replace advice given to you by your health care provider. Make sure you discuss any questions you have with your health care provider.

## 2014-07-15 LAB — IBC PANEL
Iron: 110 ug/dL (ref 42–145)
Saturation Ratios: 23.2 % (ref 20.0–50.0)
TRANSFERRIN: 338.2 mg/dL (ref 212.0–360.0)

## 2014-07-15 LAB — CBC WITH DIFFERENTIAL/PLATELET
BASOS ABS: 0 10*3/uL (ref 0.0–0.1)
Basophils Relative: 0.3 % (ref 0.0–3.0)
EOS ABS: 0.1 10*3/uL (ref 0.0–0.7)
Eosinophils Relative: 1.1 % (ref 0.0–5.0)
HCT: 37.7 % (ref 36.0–46.0)
Hemoglobin: 12.7 g/dL (ref 12.0–15.0)
LYMPHS PCT: 36.9 % (ref 12.0–46.0)
Lymphs Abs: 2.1 10*3/uL (ref 0.7–4.0)
MCHC: 33.6 g/dL (ref 30.0–36.0)
MCV: 96.4 fl (ref 78.0–100.0)
MONOS PCT: 6.4 % (ref 3.0–12.0)
Monocytes Absolute: 0.4 10*3/uL (ref 0.1–1.0)
NEUTROS PCT: 55.3 % (ref 43.0–77.0)
Neutro Abs: 3.1 10*3/uL (ref 1.4–7.7)
PLATELETS: 244 10*3/uL (ref 150.0–400.0)
RBC: 3.91 Mil/uL (ref 3.87–5.11)
RDW: 13.2 % (ref 11.5–15.5)
WBC: 5.6 10*3/uL (ref 4.0–10.5)

## 2014-07-15 LAB — LIPID PANEL
Cholesterol: 206 mg/dL — ABNORMAL HIGH (ref 0–200)
HDL: 85.5 mg/dL (ref >39.00)
LDL CALC: 97 mg/dL (ref 0–99)
NonHDL: 120.5
Total CHOL/HDL Ratio: 2
Triglycerides: 119 mg/dL (ref 0.0–149.0)
VLDL: 23.8 mg/dL (ref 0.0–40.0)

## 2014-07-15 LAB — URINE CULTURE
COLONY COUNT: NO GROWTH
ORGANISM ID, BACTERIA: NO GROWTH

## 2014-07-15 LAB — BASIC METABOLIC PANEL
BUN: 13 mg/dL (ref 6–23)
CO2: 26 mEq/L (ref 19–32)
CREATININE: 0.7 mg/dL (ref 0.4–1.2)
Calcium: 9.2 mg/dL (ref 8.4–10.5)
Chloride: 103 mEq/L (ref 96–112)
GFR: 88.58 mL/min (ref >60.00)
GLUCOSE: 67 mg/dL — AB (ref 70–99)
Potassium: 3.5 mEq/L (ref 3.5–5.1)
Sodium: 137 mEq/L (ref 135–145)

## 2014-07-15 LAB — HEPATIC FUNCTION PANEL
ALK PHOS: 57 U/L (ref 39–117)
ALT: 18 U/L (ref 0–35)
AST: 26 U/L (ref 0–37)
Albumin: 3.8 g/dL (ref 3.5–5.2)
BILIRUBIN TOTAL: 0.6 mg/dL (ref 0.2–1.2)
Bilirubin, Direct: 0.1 mg/dL (ref 0.0–0.3)
Total Protein: 7.6 g/dL (ref 6.0–8.3)

## 2014-07-15 LAB — FERRITIN: Ferritin: 94 ng/mL (ref 10.0–291.0)

## 2014-07-15 LAB — TSH: TSH: 2.38 u[IU]/mL (ref 0.35–4.50)

## 2014-07-15 NOTE — Addendum Note (Signed)
Addended by: Silvio PateHOMPSON, Esme Durkin D on: 07/15/2014 10:41 AM   Modules accepted: Orders

## 2014-07-31 ENCOUNTER — Other Ambulatory Visit: Payer: Self-pay | Admitting: Family Medicine

## 2014-07-31 ENCOUNTER — Encounter: Payer: Self-pay | Admitting: Family Medicine

## 2014-07-31 DIAGNOSIS — R05 Cough: Secondary | ICD-10-CM

## 2014-07-31 DIAGNOSIS — R059 Cough, unspecified: Secondary | ICD-10-CM

## 2014-12-18 ENCOUNTER — Ambulatory Visit: Payer: Managed Care, Other (non HMO) | Admitting: Nurse Practitioner

## 2015-02-24 ENCOUNTER — Encounter: Payer: Self-pay | Admitting: Nurse Practitioner

## 2015-02-24 ENCOUNTER — Ambulatory Visit (INDEPENDENT_AMBULATORY_CARE_PROVIDER_SITE_OTHER): Payer: Managed Care, Other (non HMO) | Admitting: Nurse Practitioner

## 2015-02-24 VITALS — BP 140/80 | HR 88 | Resp 16 | Ht 59.0 in | Wt 109.0 lb

## 2015-02-24 DIAGNOSIS — N951 Menopausal and female climacteric states: Secondary | ICD-10-CM | POA: Diagnosis not present

## 2015-02-24 DIAGNOSIS — Z01419 Encounter for gynecological examination (general) (routine) without abnormal findings: Secondary | ICD-10-CM | POA: Diagnosis not present

## 2015-02-24 DIAGNOSIS — Z0000 Encounter for general adult medical examination without abnormal findings: Secondary | ICD-10-CM

## 2015-02-24 NOTE — Patient Instructions (Addendum)

## 2015-02-24 NOTE — Progress Notes (Signed)
Patient ID: Nicole Blackburn, female   DOB: [birth year], 54 y.o.   MRN: [id number] 54 y.o. [id number] Married  Caucasian Fe here for annual exam.  Just finished last pack of OCP 2 weeks ago.  Since then some night sweats.  No vaginal bleeding.  Patient's last menstrual period was 02/24/2015 (exact date).          Sexually active: Yes.    The current method of family planning is OCP (estrogen/progesterone).    Exercising: Yes.    weights. home exercises Smoker:  no  Health Maintenance: Pap:  12-12-2011 WNL per patient MMG:  05-29-13 WNL BI Rads1 Colonoscopy:  02-29-12 repeat in 7 years TDaP:  06-08-2009 Labs: PCP does labs    reports that she has never smoked. She has never used smokeless tobacco. She reports that she drinks about 1.8 oz of alcohol per week. She reports that she does not use illicit drugs.  Past Medical History  Diagnosis Date  . Abnormal pap 1997/1998    cryo  . Anemia     Past Surgical History  Procedure Laterality Date  . Refractive surgery Bilateral 2012    Dr Nile Riggs  . Wisdom tooth extraction  age 70's    Current Outpatient Prescriptions  Medication Sig Dispense Refill  . Calcium Carbonate-Vit D-Min (CALCIUM 1200 PO) Take by mouth.    . cholecalciferol (VITAMIN D) 1000 UNITS tablet Take 1,000 Units by mouth daily.    Marland Kitchen FIBER PO Take by mouth.    . Multiple Vitamins-Minerals (MULTIVITAMIN PO) Take by mouth.     No current facility-administered medications for this visit.    Family History  Problem Relation Age of Onset  . COPD Mother   . Osteoporosis Mother   . Pulmonary embolism Mother   . Diabetes Father   . Hypertension Father   . Colon polyps Father   . Hypertension Sister   . Hypertension Brother   . Stroke Maternal Grandmother 60  . Cancer Paternal Grandmother 94    multiple myloma  . Cancer Paternal Grandfather     ? kind cancer    ROS:  Pertinent items are noted in HPI.  Otherwise, a comprehensive ROS was negative.  Exam:   BP 150/90 mmHg   Pulse 88  Resp 16  Ht 4\' 11"  (1.499 m)  Wt 109 lb (49.442 kg)  BMI 22.00 kg/m2  LMP 02/24/2015 (Exact Date) Height: 4\' 11"  (149.9 cm) Ht Readings from Last 3 Encounters:  02/24/15 4\' 11"  (1.499 m)  07/14/14 4' 11.5" (1.511 m)  12/16/13 4\' 11"  (1.499 m)    General appearance: alert, cooperative and appears stated age Head: Normocephalic, without obvious abnormality, atraumatic Neck: no adenopathy, supple, symmetrical, trachea midline and thyroid normal to inspection and palpation Lungs: clear to auscultation bilaterally Breasts: normal appearance, no masses or tenderness Heart: regular rate and rhythm Abdomen: soft, non-tender; no masses,  no organomegaly Extremities: extremities normal, atraumatic, no cyanosis or edema Skin: Skin color, texture, turgor normal. No rashes or lesions Lymph nodes: Cervical, supraclavicular, and axillary nodes normal. No abnormal inguinal nodes palpated Neurologic: Grossly normal   Pelvic: External genitalia:  no lesions              Urethra:  normal appearing urethra with no masses, tenderness or lesions              Bartholin's and Skene's: normal                 Vagina: normal appearing vagina with  normal color and brown vaginal bleeding today,  discharge, no lesions              Cervix: anteverted              Pap taken: Yes.   Bimanual Exam:  Uterus:  normal size, contour, position, consistency, mobility, non-tender              Adnexa: no mass, fullness, tenderness               Rectovaginal: Confirms               Anus:  normal sphincter tone, no lesions  Chaperone present: Yes  A:  Well Woman with normal exam  Contraception with OCP - finished last pack 2 weeks ago - bleeding today Remote history of abnormal pap 1997/1998  Some vaso symptoms - tolerable   P:   Reviewed health and wellness pertinent to exam  Pap smear as above  Mammogram is due and will schedule  Will call back if no menses in 2-3 months -  will then get FSH and TSH  DC OCP  Counseled on breast self exam, mammography screening, adequate intake of calcium and vitamin D, diet and exercise return annually or prn  An After Visit Summary was printed and given to the patient.

## 2015-02-26 LAB — IPS PAP TEST WITH HPV

## 2015-02-26 NOTE — Progress Notes (Signed)
Reviewed personally.  M. Suzanne Ryleeann Urquiza, MD.  

## 2015-06-02 ENCOUNTER — Encounter (HOSPITAL_COMMUNITY): Payer: Self-pay | Admitting: Emergency Medicine

## 2015-06-02 ENCOUNTER — Emergency Department (HOSPITAL_COMMUNITY)
Admission: EM | Admit: 2015-06-02 | Discharge: 2015-06-02 | Disposition: A | Discharge status: Home / Self Care | Payer: Managed Care, Other (non HMO) | Attending: Emergency Medicine | Admitting: Emergency Medicine

## 2015-06-02 DIAGNOSIS — R040 Epistaxis: Secondary | ICD-10-CM | POA: Insufficient documentation

## 2015-06-02 DIAGNOSIS — Z862 Personal history of diseases of the blood and blood-forming organs and certain disorders involving the immune mechanism: Secondary | ICD-10-CM | POA: Diagnosis not present

## 2015-06-02 DIAGNOSIS — Z79899 Other long term (current) drug therapy: Secondary | ICD-10-CM | POA: Diagnosis not present

## 2015-06-02 DIAGNOSIS — I10 Essential (primary) hypertension: Secondary | ICD-10-CM | POA: Diagnosis not present

## 2015-06-02 MED ORDER — LISINOPRIL 10 MG PO TABS: 10.0000 mg | ORAL_TABLET | Freq: Every day | ORAL | Status: DC

## 2015-06-02 MED ORDER — PHENYLEPHRINE HCL 0.5 % NA SOLN
1.0000 [drp] | Freq: Once | NASAL | Status: AC
  Administered 2015-06-02: 1 [drp] via NASAL

## 2015-06-02 MED ORDER — OXYMETAZOLINE HCL 0.05 % NA SOLN
NASAL | Status: AC
  Administered 2015-06-02: 1
  Filled 2015-06-02: qty 15

## 2015-06-02 NOTE — ED Provider Notes (Signed)
CSN: [id number]     Arrival date & time 06/02/15  2024 History  This chart was scribed for non-physician practitioner, Elpidio Anis, PA-C working with Eber Hong, MD, by Jarvis Morgan, ED Scribe. This patient was seen in room WTR5/WTR5 and the patient's care was started at 10:14 PM.     Chief Complaint  Patient presents with  . Epistaxis  . Hypertension    The history is provided by the patient. No language interpreter was used.    HPI Comments: Nicole Blackburn is a 54 y.o. female who presents to the Emergency Department complaining of moderate, elevated BP readings onset 1 month. She reports associated intermittent epistaxis for 1 month. She endorses that it is always from the same nostril. Pt reports the episodic epistaxis typically lasts around 30 minutes at a time. Pt states she first noticed her elevated BP readings at her CPE 1 month ago and was told she needed to keep an eye on it. She has no active nose bleed at this time. She is not currently on any medication for HTN mgmt. She denies any HAs, dizziness, chest pain, SOB, cough, rhinorrhea, sinus pressure or congestion.   Past Medical History  Diagnosis Date  . Abnormal pap 1997/1998    cryo  . Anemia    Past Surgical History  Procedure Laterality Date  . Refractive surgery Bilateral 2012    Dr Nile Riggs  . Wisdom tooth extraction  age 7's   Family History  Problem Relation Age of Onset  . COPD Mother   . Osteoporosis Mother   . Pulmonary embolism Mother   . Diabetes Father   . Hypertension Father   . Colon polyps Father   . Hypertension Sister   . Hypertension Brother   . Stroke Maternal Grandmother 60  . Cancer Paternal Grandmother 94    multiple myloma  . Cancer Paternal Grandfather     ? kind cancer   Social History  Substance Use Topics  . Smoking status: Never Smoker   . Smokeless tobacco: Never Used  . Alcohol Use: 1.8 oz/week    3 Standard drinks or equivalent per week     Comment: Socially   OB  History    Gravida Para Term Preterm AB TAB SAB Ectopic Multiple Living   Review of Systems  HENT: Positive for nosebleeds. Negative for congestion, rhinorrhea, sinus pressure and sneezing.   Respiratory: Negative for cough and shortness of breath.   Cardiovascular: Negative for chest pain.  Neurological: Negative for dizziness and headaches.      Allergies  Review of patient's allergies indicates no known allergies.  Home Medications   Prior to Admission medications   Medication Sig Start Date End Date Taking? Authorizing Provider  Calcium Carbonate-Vit D-Min (CALCIUM 1200 PO) Take 1 tablet by mouth daily.    Yes Historical Provider, MD  cholecalciferol (VITAMIN D) 1000 UNITS tablet Take 1,000 Units by mouth daily.   Yes Historical Provider, MD  Multiple Vitamins-Minerals (MULTIVITAMIN PO) Take 1 tablet by mouth daily.    Yes Historical Provider, MD  naproxen sodium (ANAPROX) 220 MG tablet Take 440 mg by mouth 2 (two) times daily as needed (for pain.).   Yes Historical Provider, MD  psyllium (METAMUCIL) 0.52 G capsule Take 0.52 g by mouth daily.   Yes Historical Provider, MD   Triage Vitals: BP 181/97 mmHg  Pulse 82  Temp(Src) 98.1 F (36.7 C) (Oral)  Resp 20  SpO2 100%  LMP  (Approximate)  Physical Exam  Constitutional: She is oriented to person, place, and time. She appears well-developed and well-nourished. No distress.  HENT:  Head: Normocephalic and atraumatic.  Nose: No epistaxis.  Area of left anterior septum is mild swollen and small clot thought foci of bleed  Eyes: Conjunctivae and EOM are normal.  Neck: Neck supple. No tracheal deviation present.  Cardiovascular: Normal rate.   Pulmonary/Chest: Effort normal. No respiratory distress.  Musculoskeletal: Normal range of motion. She exhibits no edema.  Neurological: She is alert and oriented to person, place, and time.  Skin: Skin is warm and dry.  Psychiatric: She has a normal mood and  affect. Her behavior is normal.  Nursing note and vitals reviewed.   ED Course  Procedures (including critical care time)  DIAGNOSTIC STUDIES: Oxygen Saturation is 100% on RA, normal by my interpretation.    COORDINATION OF CARE:  . Labs Review Labs Reviewed - No data to display  Imaging Review No results found. I have personally reviewed and evaluated these images and lab results as part of my medical decision-making.   EKG Interpretation None      MDM   Final diagnoses:  None    1. Epistaxis, resolved 2. Hypertension  Discussed with Dr. Hyacinth Meeker. Will start patient on Lisinopril 10 mg. Discussed angioedema, signs/symptoms and need for immediate follow up with any onset.   The patient has further bleeding while in the emergency department, easily resolved with Afrin and pressure. She is otherwise asymptomatic. Stable for discharge.   I personally performed the services described in this documentation, which was scribed in my presence. The recorded information has been reviewed and is accurate.     Elpidio Anis, PA-C 06/02/15 [id number]  Eber Hong, MD 06/05/15 [phone]

## 2015-06-02 NOTE — ED Notes (Signed)
Pt reports intermittent nose bleeds for the past month. Presents with HTN but does not take medication for this. Only scant bleeding noted in triage. Denies dizziness. No other c/c. Reports working increased amounts the past 2 years. Pt is A&Ox4. Ambulatory with steady gait.

## 2015-06-02 NOTE — Discharge Instructions (Signed)
Nosebleed °Nosebleeds can be caused by many conditions, including trauma, infections, polyps, foreign bodies, dry mucous membranes or climate, medicines, and air conditioning. Most nosebleeds occur in the front of the nose. Because of this location, most nosebleeds can be controlled by pinching the nostrils gently and continuously for at least 10 to 20 minutes. The long, continuous pressure allows enough time for the blood to clot. If pressure is released during that 10 to 20 minute time period, the process may have to be started again. The nosebleed may stop by itself or quit with pressure, or it may need concentrated heating (cautery) or pressure from packing. °HOME CARE INSTRUCTIONS  °· If your nose was packed, try to maintain the pack inside until your health care provider removes it. If a gauze pack was used and it starts to fall out, gently replace it or cut the end off. Do not cut if a balloon catheter was used to pack the nose. Otherwise, do not remove unless instructed. °· Avoid blowing your nose for 12 hours after treatment. This could dislodge the pack or clot and start the bleeding again. °· If the bleeding starts again, sit up and bend forward, gently pinching the front half of your nose continuously for 20 minutes. °· If bleeding was caused by dry mucous membranes, use over-the-counter saline nasal spray or gel. This will keep the mucous membranes moist and allow them to heal. If you must use a lubricant, choose the water-soluble variety. Use it only sparingly and not within several hours of lying down. °· Do not use petroleum jelly or mineral oil, as these may drip into the lungs and cause serious problems. °· Maintain humidity in your home by using less air conditioning or by using a humidifier. °· Do not use aspirin or medicines which make bleeding more likely. Your health care provider can give you recommendations on this. °· Resume normal activities as you are able, but try to avoid straining,  lifting, or bending at the waist for several days. °· If the nosebleeds become recurrent and the cause is unknown, your health care provider may suggest laboratory tests. °SEEK MEDICAL CARE IF: °You have a fever. °SEEK IMMEDIATE MEDICAL CARE IF:  °· Bleeding recurs and cannot be controlled. °· There is unusual bleeding from or bruising on other parts of the body. °· Nosebleeds continue. °· There is any worsening of the condition which originally brought you in. °· You become light-headed, feel faint, become sweaty, or vomit blood. °MAKE SURE YOU:  °· Understand these instructions. °· Will watch your condition. °· Will get help right away if you are not doing well or get worse. °Document Released: 06/08/2005 Document Revised: 01/13/2014 Document Reviewed: 07/30/2009 °Exit[employer]® Patient Information ©2015 [employer]. This information is not intended to replace advice given to you by your health care provider. Make sure you discuss any questions you have with your health care provider. °Hypertension °Hypertension, commonly called high blood pressure, is when the force of blood pumping through your arteries is too strong. Your arteries are the blood vessels that carry blood from your heart throughout your body. A blood pressure reading consists of a higher number over a lower number, such as 110/72. The higher number (systolic) is the pressure inside your arteries when your heart pumps. The lower number (diastolic) is the pressure inside your arteries when your heart relaxes. Ideally you want your blood pressure below 120/80. °Hypertension forces your heart to work harder to pump blood. Your arteries may become   narrow or stiff. Having hypertension puts you at risk for heart disease, stroke, and other problems.  °RISK FACTORS °Some risk factors for high blood pressure are controllable. Others are not.  °Risk factors you cannot control include:  °· Race. You may be at higher risk if you are African American. °· Age. Risk  increases with age. °· Gender. Men are at higher risk than women before age 45 years. After age 65, women are at higher risk than men. °Risk factors you can control include: °· Not getting enough exercise or physical activity. °· Being overweight. °· Getting too much fat, sugar, calories, or salt in your diet. °· Drinking too much alcohol. °SIGNS AND SYMPTOMS °Hypertension does not usually cause signs or symptoms. Extremely high blood pressure (hypertensive crisis) may cause headache, anxiety, shortness of breath, and nosebleed. °DIAGNOSIS  °To check if you have hypertension, your health care provider will measure your blood pressure while you are seated, with your arm held at the level of your heart. It should be measured at least twice using the same arm. Certain conditions can cause a difference in blood pressure between your right and left arms. A blood pressure reading that is higher than normal on one occasion does not mean that you need treatment. If one blood pressure reading is high, ask your health care provider about having it checked again. °TREATMENT  °Treating high blood pressure includes making lifestyle changes and possibly taking medicine. Living a healthy lifestyle can help lower high blood pressure. You may need to change some of your habits. °Lifestyle changes may include: °· Following the DASH diet. This diet is high in fruits, vegetables, and whole grains. It is low in salt, red meat, and added sugars. °· Getting at least 2½ hours of brisk physical activity every week. °· Losing weight if necessary. °· Not smoking. °· Limiting alcoholic beverages. °· Learning ways to reduce stress. ° If lifestyle changes are not enough to get your blood pressure under control, your health care provider may prescribe medicine. You may need to take more than one. Work closely with your health care provider to understand the risks and benefits. °HOME CARE INSTRUCTIONS °· Have your blood pressure rechecked as  directed by your health care provider.   °· Take medicines only as directed by your health care provider. Follow the directions carefully. Blood pressure medicines must be taken as prescribed. The medicine does not work as well when you skip doses. Skipping doses also puts you at risk for problems.   °· Do not smoke.   °· Monitor your blood pressure at home as directed by your health care provider.  °SEEK MEDICAL CARE IF:  °· You think you are having a reaction to medicines taken. °· You have recurrent headaches or feel dizzy. °· You have swelling in your ankles. °· You have trouble with your vision. °SEEK IMMEDIATE MEDICAL CARE IF: °· You develop a severe headache or confusion. °· You have unusual weakness, numbness, or feel faint. °· You have severe chest or abdominal pain. °· You vomit repeatedly. °· You have trouble breathing. °MAKE SURE YOU:  °· Understand these instructions. °· Will watch your condition. °· Will get help right away if you are not doing well or get worse. °Document Released: 08/29/2005 Document Revised: 01/13/2014 Document Reviewed: 06/21/2013 °[employer]® Patient Information ©2015 [employer]. This information is not intended to replace advice given to you by your health care provider. Make sure you discuss any questions you have with your health care provider. ° °

## 2015-06-02 NOTE — ED Notes (Signed)
PA at bedside.

## 2015-06-02 NOTE — ED Notes (Signed)
Active nose bleed. Verbal order for afrin

## 2015-06-02 NOTE — ED Notes (Signed)
Bleeding has stopped. 

## 2015-06-03 ENCOUNTER — Ambulatory Visit (INDEPENDENT_AMBULATORY_CARE_PROVIDER_SITE_OTHER): Payer: Managed Care, Other (non HMO) | Admitting: Medical

## 2015-06-03 ENCOUNTER — Encounter: Payer: Self-pay | Admitting: Medical

## 2015-06-03 VITALS — BP 140/85 | HR 88 | Temp 97.7°F | Ht 59.0 in | Wt 108.0 lb

## 2015-06-03 DIAGNOSIS — R040 Epistaxis: Secondary | ICD-10-CM

## 2015-06-03 DIAGNOSIS — I10 Essential (primary) hypertension: Secondary | ICD-10-CM | POA: Diagnosis not present

## 2015-06-03 MED ORDER — LISINOPRIL 5 MG PO TABS: 5.0000 mg | ORAL_TABLET | Freq: Every day | ORAL | Status: DC

## 2015-06-03 NOTE — Patient Instructions (Addendum)
Your bp today not as high as when your were in the ED but not as low as when my MA checked. Rather they are mild to moderate high. I am prescribing lisinopril 5 mg a day. I want you to get cmp and cbc today. Check you bp readings daily an document reading. If you do get very high reading as in ED then notify me would advise going ahead and advancing to 10 mg dose daily.  For your epistaxis you have afrin on hand if you get another nose bleed. Will go ahead and refer you to ENT for the nose bleeds. The ENT will be able to better assess your nostril which are on the narrow side. Please notify if you have recurrent nose bleed.   If severe nose bleed not responding to afrin then ED evaluation.  Follow up in 2 wks or as needed.

## 2015-06-03 NOTE — Progress Notes (Signed)
Subjective:    Patient ID: Nicole Blackburn, female    DOB: [birth year], 54 y.o.   MRN: [id number]  HPI   Pt in follow up for nose bleed. She states last night nose bleed was moderate severe(lt nostril). Her blood pressure was 181/97. Pt states in waiting room her nose bleed stopped. Then later when examined restarted. Pt nose stopped easily with afrin.   Pt has nose bleed that occurs in past rarely. But last wed, Thursday and Sunday. Nose would last about 20 minutes. Last night lasted about 40 minutes.  In ED her blood pressure was high. They rx'd lisinopril. She did not start med yet. The last 3 months her blood pressure readings have been moderate hight. Before that bp was normal.  Pt has at home bp cuff. And her readings last night 145/89. Then 130/87  Pt thinks some hx of bruising easily.  Denies allergy symptoms.    Review of Systems  Constitutional: Negative for fever, chills, diaphoresis, activity change and fatigue.  HENT: Negative for congestion, facial swelling, hearing loss, mouth sores, nosebleeds, postnasal drip and rhinorrhea.        Last nose bleed last night.  Respiratory: Negative for cough, chest tightness and shortness of breath.   Cardiovascular: Negative for chest pain, palpitations and leg swelling.  Gastrointestinal: Negative for nausea, vomiting and abdominal pain.  Musculoskeletal: Negative for neck pain and neck stiffness.  Neurological: Negative for dizziness, tremors, seizures, syncope, facial asymmetry, speech difficulty, weakness, light-headedness, numbness and headaches.  Psychiatric/Behavioral: Negative for behavioral problems, confusion and agitation. The patient is not nervous/anxious.    Past Medical History  Diagnosis Date  . Abnormal pap 1997/1998    cryo  . Anemia     Social History   Social History  . Marital Status: Married    Spouse Name: N/A  . Number of Children: N/A  . Years of Education: N/A   Occupational History  . [employer]--director marketing    Social History Main Topics  . Smoking status: Never Smoker   . Smokeless tobacco: Never Used  . Alcohol Use: 1.8 oz/week    3 Standard drinks or equivalent per week     Comment: Socially  . Drug Use: No  . Sexual Activity:    Partners: Male    Birth Control/ Protection: Pill   Other Topics Concern  . Not on file   Social History Narrative   Exercise-- elliptical    Past Surgical History  Procedure Laterality Date  . Refractive surgery Bilateral 2012    Dr Nile Riggs  . Wisdom tooth extraction  age 56's    Family History  Problem Relation Age of Onset  . COPD Mother   . Osteoporosis Mother   . Pulmonary embolism Mother   . Diabetes Father   . Hypertension Father   . Colon polyps Father   . Hypertension Sister   . Hypertension Brother   . Stroke Maternal Grandmother 60  . Cancer Paternal Grandmother 19    multiple myloma  . Cancer Paternal Grandfather     ? kind cancer    No Known Allergies  Current Outpatient Prescriptions on File Prior to Visit  Medication Sig Dispense Refill  . Calcium Carbonate-Vit D-Min (CALCIUM 1200 PO) Take 1 tablet by mouth daily.     . cholecalciferol (VITAMIN D) 1000 UNITS tablet Take 1,000 Units by mouth daily.    . Multiple Vitamins-Minerals (MULTIVITAMIN PO) Take 1 tablet by mouth daily.     Marland Kitchen  naproxen sodium (ANAPROX) 220 MG tablet Take 440 mg by mouth 2 (two) times daily as needed (for pain.).    Marland Kitchen psyllium (METAMUCIL) 0.52 G capsule Take 0.52 g by mouth daily.     No current facility-administered medications on file prior to visit.    BP 140/85 mmHg  Pulse 88  Temp(Src) 97.7 F (36.5 C) (Oral)  Ht  (1.499 m)  Wt 108 lb (48.988 kg)  BMI 21.80 kg/m2  SpO2 99%  LMP  (Approximate)       Objective:   Physical Exam  General Mental Status- Alert. General Appearance- Not in acute distress.   Skin General: Color- Normal Color. Moisture- Normal Moisture.  Nose- very narrow nares. Could  not see very deep into nares on left side. No bleeding. No abnormality.  Neck Carotid Arteries- Normal color. Moisture- Normal Moisture. No carotid bruits. No JVD.  Chest and Lung Exam Auscultation: Breath Sounds:-Normal.  Cardiovascular Auscultation:Rythm- Regular. Murmurs & Other Heart Sounds:Auscultation of the heart reveals- No Murmurs.   Neurologic Cranial Nerve exam:- CN III-XII intact(No nystagmus), symmetric smile. Strength:- 5/5 equal and symmetric strength both upper and lower extremities.       Assessment & Plan:  Your bp today not as high as when your were in the ED but not as low as when my MA checked. Rather they are mild to moderate high. I am prescribing lisinopril 5 mg a day. I want you to get cmp and cbc today. Check you bp readings daily an document reading. If you do get very high reading as in ED then notify me would advise going ahead and advancing to 10 mg dose daily.  For your epistaxis you have afrin on hand if your get another nose blee). Will go ahead and refer you to ENT for the nose bleeds. The ENT will be able to better assess your nostril which are on the narrow side. Please notify if you have recurrent nose bleed.   If severe nose bleed not responding to afrin then ED evaluation.  Follow up in 2 wks or as needed.

## 2015-06-03 NOTE — Progress Notes (Signed)
Pre visit review using our clinic review tool, if applicable. No additional management support is needed unless otherwise documented below in the visit note. 

## 2015-06-04 LAB — CBC WITH DIFFERENTIAL/PLATELET
BASOS PCT: 0.5 % (ref 0.0–3.0)
Basophils Absolute: 0 10*3/uL (ref 0.0–0.1)
EOS PCT: 1.2 % (ref 0.0–5.0)
Eosinophils Absolute: 0.1 10*3/uL (ref 0.0–0.7)
HCT: 39.7 % (ref 36.0–46.0)
Hemoglobin: 13.4 g/dL (ref 12.0–15.0)
LYMPHS ABS: 1.5 10*3/uL (ref 0.7–4.0)
Lymphocytes Relative: 29.1 % (ref 12.0–46.0)
MCHC: 33.8 g/dL (ref 30.0–36.0)
MCV: 95.3 fl (ref 78.0–100.0)
MONOS PCT: 7.8 % (ref 3.0–12.0)
Monocytes Absolute: 0.4 10*3/uL (ref 0.1–1.0)
NEUTROS PCT: 61.4 % (ref 43.0–77.0)
Neutro Abs: 3.2 10*3/uL (ref 1.4–7.7)
Platelets: 234 10*3/uL (ref 150.0–400.0)
RBC: 4.16 Mil/uL (ref 3.87–5.11)
RDW: 12.4 % (ref 11.5–15.5)
WBC: 5.2 10*3/uL (ref 4.0–10.5)

## 2015-06-04 LAB — COMPREHENSIVE METABOLIC PANEL
ALK PHOS: 73 U/L (ref 39–117)
ALT: 18 U/L (ref 0–35)
AST: 22 U/L (ref 0–37)
Albumin: 4.5 g/dL (ref 3.5–5.2)
BUN: 20 mg/dL (ref 6–23)
CO2: 29 mEq/L (ref 19–32)
Calcium: 10.3 mg/dL (ref 8.4–10.5)
Chloride: 106 mEq/L (ref 96–112)
Creatinine, Ser: 0.73 mg/dL (ref 0.40–1.20)
GFR: 88.28 mL/min (ref >60.00)
GLUCOSE: 99 mg/dL (ref 70–99)
POTASSIUM: 5 meq/L (ref 3.5–5.1)
Sodium: 144 mEq/L (ref 135–145)
Total Bilirubin: 0.7 mg/dL (ref 0.2–1.2)
Total Protein: 7.7 g/dL (ref 6.0–8.3)

## 2015-06-17 ENCOUNTER — Ambulatory Visit: Payer: Managed Care, Other (non HMO) | Admitting: Medical

## 2015-07-17 ENCOUNTER — Encounter: Payer: Managed Care, Other (non HMO) | Admitting: Family Medicine

## 2015-07-20 ENCOUNTER — Encounter: Payer: Self-pay | Admitting: Family Medicine

## 2015-07-20 ENCOUNTER — Ambulatory Visit (INDEPENDENT_AMBULATORY_CARE_PROVIDER_SITE_OTHER): Payer: Managed Care, Other (non HMO) | Admitting: Family Medicine

## 2015-07-20 VITALS — BP 156/88 | HR 85 | Temp 98.1°F | Ht 59.0 in | Wt 108.8 lb

## 2015-07-20 DIAGNOSIS — I10 Essential (primary) hypertension: Secondary | ICD-10-CM

## 2015-07-20 DIAGNOSIS — Z0000 Encounter for general adult medical examination without abnormal findings: Secondary | ICD-10-CM | POA: Diagnosis not present

## 2015-07-20 DIAGNOSIS — Z1159 Encounter for screening for other viral diseases: Secondary | ICD-10-CM

## 2015-07-20 DIAGNOSIS — Z114 Encounter for screening for human immunodeficiency virus [HIV]: Secondary | ICD-10-CM

## 2015-07-20 MED ORDER — LISINOPRIL 10 MG PO TABS: 10.0000 mg | ORAL_TABLET | Freq: Every day | ORAL | Status: DC

## 2015-07-20 NOTE — Patient Instructions (Signed)
Preventive Care for Adults, Female A healthy lifestyle and preventive care can promote health and wellness. Preventive health guidelines for women include the following key practices.  A routine yearly physical is a good way to check with your health care provider about your health and preventive screening. It is a chance to share any concerns and updates on your health and to receive a thorough exam.  Visit your dentist for a routine exam and preventive care every 6 months. Brush your teeth twice a day and floss once a day. Good oral hygiene prevents tooth decay and gum disease.  The frequency of eye exams is based on your age, health, family medical history, use of contact lenses, and other factors. Follow your health care provider's recommendations for frequency of eye exams.  Eat a healthy diet. Foods like vegetables, fruits, whole grains, low-fat dairy products, and lean protein foods contain the nutrients you need without too many calories. Decrease your intake of foods high in solid fats, added sugars, and salt. Eat the right amount of calories for you.Get information about a proper diet from your health care provider, if necessary.  Regular physical exercise is one of the most important things you can do for your health. Most adults should get at least 150 minutes of moderate-intensity exercise (any activity that increases your heart rate and causes you to sweat) each week. In addition, most adults need muscle-strengthening exercises on 2 or more days a week.  Maintain a healthy weight. The body mass index (BMI) is a screening tool to identify possible weight problems. It provides an estimate of body fat based on height and weight. Your health care provider can find your BMI and can help you achieve or maintain a healthy weight.For adults 20 years and older:  A BMI below 18.5 is considered underweight.  A BMI of 18.5 to 24.9 is normal.  A BMI of 25 to 29.9 is considered overweight.  A  BMI of 30 and above is considered obese.  Maintain normal blood lipids and cholesterol levels by exercising and minimizing your intake of saturated fat. Eat a balanced diet with plenty of fruit and vegetables. Blood tests for lipids and cholesterol should begin at age 45 and be repeated every 5 years. If your lipid or cholesterol levels are high, you are over 50, or you are at high risk for heart disease, you may need your cholesterol levels checked more frequently.Ongoing high lipid and cholesterol levels should be treated with medicines if diet and exercise are not working.  If you smoke, find out from your health care provider how to quit. If you do not use tobacco, do not start.  Lung cancer screening is recommended for adults aged 45-80 years who are at high risk for developing lung cancer because of a history of smoking. A yearly low-dose CT scan of the lungs is recommended for people who have at least a 30-pack-year history of smoking and are a current smoker or have quit within the past 15 years. A pack year of smoking is smoking an average of 1 pack of cigarettes a day for 1 year (for example: 1 pack a day for 30 years or 2 packs a day for 15 years). Yearly screening should continue until the smoker has stopped smoking for at least 15 years. Yearly screening should be stopped for people who develop a health problem that would prevent them from having lung cancer treatment.  If you are pregnant, do not drink alcohol. If you are  breastfeeding, be very cautious about drinking alcohol. If you are not pregnant and choose to drink alcohol, do not have more than 1 drink per day. One drink is considered to be 12 ounces (355 mL) of beer, 5 ounces (148 mL) of wine, or 1.5 ounces (44 mL) of liquor.  Avoid use of street drugs. Do not share needles with anyone. Ask for help if you need support or instructions about stopping the use of drugs.  High blood pressure causes heart disease and increases the risk  of stroke. Your blood pressure should be checked at least every 1 to 2 years. Ongoing high blood pressure should be treated with medicines if weight loss and exercise do not work.  If you are 55-79 years old, ask your health care provider if you should take aspirin to prevent strokes.  Diabetes screening is done by taking a blood sample to check your blood glucose level after you have not eaten for a certain period of time (fasting). If you are not overweight and you do not have risk factors for diabetes, you should be screened once every 3 years starting at age 45. If you are overweight or obese and you are 40-70 years of age, you should be screened for diabetes every year as part of your cardiovascular risk assessment.  Breast cancer screening is essential preventive care for women. You should practice "breast self-awareness." This means understanding the normal appearance and feel of your breasts and may include breast self-examination. Any changes detected, no matter how small, should be reported to a health care provider. Women in their 20s and 30s should have a clinical breast exam (CBE) by a health care provider as part of a regular health exam every 1 to 3 years. After age 40, women should have a CBE every year. Starting at age 40, women should consider having a mammogram (breast X-ray test) every year. Women who have a family history of breast cancer should talk to their health care provider about genetic screening. Women at a high risk of breast cancer should talk to their health care providers about having an MRI and a mammogram every year.  Breast cancer gene (BRCA)-related cancer risk assessment is recommended for women who have family members with BRCA-related cancers. BRCA-related cancers include breast, ovarian, tubal, and peritoneal cancers. Having family members with these cancers may be associated with an increased risk for harmful changes (mutations) in the breast cancer genes BRCA1 and  BRCA2. Results of the assessment will determine the need for genetic counseling and BRCA1 and BRCA2 testing.  Your health care provider may recommend that you be screened regularly for cancer of the pelvic organs (ovaries, uterus, and vagina). This screening involves a pelvic examination, including checking for microscopic changes to the surface of your cervix (Pap test). You may be encouraged to have this screening done every 3 years, beginning at age 21.  For women ages 30-65, health care providers may recommend pelvic exams and Pap testing every 3 years, or they may recommend the Pap and pelvic exam, combined with testing for human papilloma virus (HPV), every 5 years. Some types of HPV increase your risk of cervical cancer. Testing for HPV may also be done on women of any age with unclear Pap test results.  Other health care providers may not recommend any screening for nonpregnant women who are considered low risk for pelvic cancer and who do not have symptoms. Ask your health care provider if a screening pelvic exam is right for   you.  If you have had past treatment for cervical cancer or a condition that could lead to cancer, you need Pap tests and screening for cancer for at least 20 years after your treatment. If Pap tests have been discontinued, your risk factors (such as having a new sexual partner) need to be reassessed to determine if screening should resume. Some women have medical problems that increase the chance of getting cervical cancer. In these cases, your health care provider may recommend more frequent screening and Pap tests.  Colorectal cancer can be detected and often prevented. Most routine colorectal cancer screening begins at the age of 50 years and continues through age 75 years. However, your health care provider may recommend screening at an earlier age if you have risk factors for colon cancer. On a yearly basis, your health care provider may provide home test kits to check  for hidden blood in the stool. Use of a small camera at the end of a tube, to directly examine the colon (sigmoidoscopy or colonoscopy), can detect the earliest forms of colorectal cancer. Talk to your health care provider about this at age 50, when routine screening begins. Direct exam of the colon should be repeated every 5-10 years through age 75 years, unless early forms of precancerous polyps or small growths are found.  People who are at an increased risk for hepatitis B should be screened for this virus. You are considered at high risk for hepatitis B if:  You were born in a country where hepatitis B occurs often. Talk with your health care provider about which countries are considered high risk.  Your parents were born in a high-risk country and you have not received a shot to protect against hepatitis B (hepatitis B vaccine).  You have HIV or AIDS.  You use needles to inject street drugs.  You live with, or have sex with, someone who has hepatitis B.  You get hemodialysis treatment.  You take certain medicines for conditions like cancer, organ transplantation, and autoimmune conditions.  Hepatitis C blood testing is recommended for all people born from 1945 through 1965 and any individual with known risks for hepatitis C.  Practice safe sex. Use condoms and avoid high-risk sexual practices to reduce the spread of sexually transmitted infections (STIs). STIs include gonorrhea, chlamydia, syphilis, trichomonas, herpes, HPV, and human immunodeficiency virus (HIV). Herpes, HIV, and HPV are viral illnesses that have no cure. They can result in disability, cancer, and death.  You should be screened for sexually transmitted illnesses (STIs) including gonorrhea and chlamydia if:  You are sexually active and are younger than 24 years.  You are older than 24 years and your health care provider tells you that you are at risk for this type of infection.  Your sexual activity has changed  since you were last screened and you are at an increased risk for chlamydia or gonorrhea. Ask your health care provider if you are at risk.  If you are at risk of being infected with HIV, it is recommended that you take a prescription medicine daily to prevent HIV infection. This is called preexposure prophylaxis (PrEP). You are considered at risk if:  You are sexually active and do not regularly use condoms or know the HIV status of your partner(s).  You take drugs by injection.  You are sexually active with a partner who has HIV.  Talk with your health care provider about whether you are at high risk of being infected with HIV. If   you choose to begin PrEP, you should first be tested for HIV. You should then be tested every 3 months for as long as you are taking PrEP.  Osteoporosis is a disease in which the bones lose minerals and strength with aging. This can result in serious bone fractures or breaks. The risk of osteoporosis can be identified using a bone density scan. Women ages 67 years and over and women at risk for fractures or osteoporosis should discuss screening with their health care providers. Ask your health care provider whether you should take a calcium supplement or vitamin D to reduce the rate of osteoporosis.  Menopause can be associated with physical symptoms and risks. Hormone replacement therapy is available to decrease symptoms and risks. You should talk to your health care provider about whether hormone replacement therapy is right for you.  Use sunscreen. Apply sunscreen liberally and repeatedly throughout the day. You should seek shade when your shadow is shorter than you. Protect yourself by wearing long sleeves, pants, a wide-brimmed hat, and sunglasses year round, whenever you are outdoors.  Once a month, do a whole body skin exam, using a mirror to look at the skin on your back. Tell your health care provider of new moles, moles that have irregular borders, moles that  are larger than a pencil eraser, or moles that have changed in shape or color.  Stay current with required vaccines (immunizations).  Influenza vaccine. All adults should be immunized every year.  Tetanus, diphtheria, and acellular pertussis (Td, Tdap) vaccine. Pregnant women should receive 1 dose of Tdap vaccine during each pregnancy. The dose should be obtained regardless of the length of time since the last dose. Immunization is preferred during the 27th-36th week of gestation. An adult who has not previously received Tdap or who does not know her vaccine status should receive 1 dose of Tdap. This initial dose should be followed by tetanus and diphtheria toxoids (Td) booster doses every 10 years. Adults with an unknown or incomplete history of completing a 3-dose immunization series with Td-containing vaccines should begin or complete a primary immunization series including a Tdap dose. Adults should receive a Td booster every 10 years.  Varicella vaccine. An adult without evidence of immunity to varicella should receive 2 doses or a second dose if she has previously received 1 dose. Pregnant females who do not have evidence of immunity should receive the first dose after pregnancy. This first dose should be obtained before leaving the health care facility. The second dose should be obtained 4-8 weeks after the first dose.  Human papillomavirus (HPV) vaccine. Females aged 13-26 years who have not received the vaccine previously should obtain the 3-dose series. The vaccine is not recommended for use in pregnant females. However, pregnancy testing is not needed before receiving a dose. If a female is found to be pregnant after receiving a dose, no treatment is needed. In that case, the remaining doses should be delayed until after the pregnancy. Immunization is recommended for any person with an immunocompromised condition through the age of 61 years if she did not get any or all doses earlier. During the  3-dose series, the second dose should be obtained 4-8 weeks after the first dose. The third dose should be obtained 24 weeks after the first dose and 16 weeks after the second dose.  Zoster vaccine. One dose is recommended for adults aged 30 years or older unless certain conditions are present.  Measles, mumps, and rubella (MMR) vaccine. Adults born  before 1957 generally are considered immune to measles and mumps. Adults born in 1957 or later should have 1 or more doses of MMR vaccine unless there is a contraindication to the vaccine or there is laboratory evidence of immunity to each of the three diseases. A routine second dose of MMR vaccine should be obtained at least 28 days after the first dose for students attending postsecondary schools, health care workers, or international travelers. People who received inactivated measles vaccine or an unknown type of measles vaccine during 1963-1967 should receive 2 doses of MMR vaccine. People who received inactivated mumps vaccine or an unknown type of mumps vaccine before 1979 and are at high risk for mumps infection should consider immunization with 2 doses of MMR vaccine. For females of childbearing age, rubella immunity should be determined. If there is no evidence of immunity, females who are not pregnant should be vaccinated. If there is no evidence of immunity, females who are pregnant should delay immunization until after pregnancy. Unvaccinated health care workers born before 1957 who lack laboratory evidence of measles, mumps, or rubella immunity or laboratory confirmation of disease should consider measles and mumps immunization with 2 doses of MMR vaccine or rubella immunization with 1 dose of MMR vaccine.  Pneumococcal 13-valent conjugate (PCV13) vaccine. When indicated, a person who is uncertain of his immunization history and has no record of immunization should receive the PCV13 vaccine. All adults 65 years of age and older should receive this  vaccine. An adult aged 19 years or older who has certain medical conditions and has not been previously immunized should receive 1 dose of PCV13 vaccine. This PCV13 should be followed with a dose of pneumococcal polysaccharide (PPSV23) vaccine. Adults who are at high risk for pneumococcal disease should obtain the PPSV23 vaccine at least 8 weeks after the dose of PCV13 vaccine. Adults older than 54 years of age who have normal immune system function should obtain the PPSV23 vaccine dose at least 1 year after the dose of PCV13 vaccine.  Pneumococcal polysaccharide (PPSV23) vaccine. When PCV13 is also indicated, PCV13 should be obtained first. All adults aged 65 years and older should be immunized. An adult younger than age 65 years who has certain medical conditions should be immunized. Any person who resides in a nursing home or long-term care facility should be immunized. An adult smoker should be immunized. People with an immunocompromised condition and certain other conditions should receive both PCV13 and PPSV23 vaccines. People with human immunodeficiency virus (HIV) infection should be immunized as soon as possible after diagnosis. Immunization during chemotherapy or radiation therapy should be avoided. Routine use of PPSV23 vaccine is not recommended for American Indians, [place] Natives, or people younger than 65 years unless there are medical conditions that require PPSV23 vaccine. When indicated, people who have unknown immunization and have no record of immunization should receive PPSV23 vaccine. One-time revaccination 5 years after the first dose of PPSV23 is recommended for people aged 19-64 years who have chronic kidney failure, nephrotic syndrome, asplenia, or immunocompromised conditions. People who received 1-2 doses of PPSV23 before age 65 years should receive another dose of PPSV23 vaccine at age 65 years or later if at least 5 years have passed since the previous dose. Doses of PPSV23 are not  needed for people immunized with PPSV23 at or after age 65 years.  Meningococcal vaccine. Adults with asplenia or persistent complement component deficiencies should receive 2 doses of quadrivalent meningococcal conjugate (MenACWY-D) vaccine. The doses should be obtained   at least 2 months apart. Microbiologists working with certain meningococcal bacteria, [place] recruits, people at risk during an outbreak, and people who travel to or live in countries with a high rate of meningitis should be immunized. A first-year college student up through age 34 years who is living in a residence hall should receive a dose if she did not receive a dose on or after her 16th birthday. Adults who have certain high-risk conditions should receive one or more doses of vaccine.  Hepatitis A vaccine. Adults who wish to be protected from this disease, have certain high-risk conditions, work with hepatitis A-infected animals, work in hepatitis A research labs, or travel to or work in countries with a high rate of hepatitis A should be immunized. Adults who were previously unvaccinated and who anticipate close contact with an international adoptee during the first 60 days after arrival in the [place] from a country with a high rate of hepatitis A should be immunized.  Hepatitis B vaccine. Adults who wish to be protected from this disease, have certain high-risk conditions, may be exposed to blood or other infectious body fluids, are household contacts or sex partners of hepatitis B positive people, are clients or workers in certain care facilities, or travel to or work in countries with a high rate of hepatitis B should be immunized.  Haemophilus influenzae type b (Hib) vaccine. A previously unvaccinated person with asplenia or sickle cell disease or having a scheduled splenectomy should receive 1 dose of Hib vaccine. Regardless of previous immunization, a recipient of a hematopoietic stem cell transplant should receive a  3-dose series 6-12 months after her successful transplant. Hib vaccine is not recommended for adults with HIV infection. Preventive Services / Frequency Ages 35 to 4 years  Blood pressure check.** / Every 3-5 years.  Lipid and cholesterol check.** / Every 5 years beginning at age 60.  Clinical breast exam.** / Every 3 years for women in their 71s and 10s.  BRCA-related cancer risk assessment.** / For women who have family members with a BRCA-related cancer (breast, ovarian, tubal, or peritoneal cancers).  Pap test.** / Every 2 years from ages 76 through 26. Every 3 years starting at age 61 through age 76 or 93 with a history of 3 consecutive normal Pap tests.  HPV screening.** / Every 3 years from ages 37 through ages 60 to 51 with a history of 3 consecutive normal Pap tests.  Hepatitis C blood test.** / For any individual with known risks for hepatitis C.  Skin self-exam. / Monthly.  Influenza vaccine. / Every year.  Tetanus, diphtheria, and acellular pertussis (Tdap, Td) vaccine.** / Consult your health care provider. Pregnant women should receive 1 dose of Tdap vaccine during each pregnancy. 1 dose of Td every 10 years.  Varicella vaccine.** / Consult your health care provider. Pregnant females who do not have evidence of immunity should receive the first dose after pregnancy.  HPV vaccine. / 3 doses over 6 months, if 93 and younger. The vaccine is not recommended for use in pregnant females. However, pregnancy testing is not needed before receiving a dose.  Measles, mumps, rubella (MMR) vaccine.** / You need at least 1 dose of MMR if you were born in 1957 or later. You may also need a 2nd dose. For females of childbearing age, rubella immunity should be determined. If there is no evidence of immunity, females who are not pregnant should be vaccinated. If there is no evidence of immunity, females who are  pregnant should delay immunization until after pregnancy.  Pneumococcal  13-valent conjugate (PCV13) vaccine.** / Consult your health care provider.  Pneumococcal polysaccharide (PPSV23) vaccine.** / 1 to 2 doses if you smoke cigarettes or if you have certain conditions.  Meningococcal vaccine.** / 1 dose if you are age 68 to 8 years and a Market researcher living in a residence hall, or have one of several medical conditions, you need to get vaccinated against meningococcal disease. You may also need additional booster doses.  Hepatitis A vaccine.** / Consult your health care provider.  Hepatitis B vaccine.** / Consult your health care provider.  Haemophilus influenzae type b (Hib) vaccine.** / Consult your health care provider. Ages 7 to 53 years  Blood pressure check.** / Every year.  Lipid and cholesterol check.** / Every 5 years beginning at age 25 years.  Lung cancer screening. / Every year if you are aged 11-80 years and have a 30-pack-year history of smoking and currently smoke or have quit within the past 15 years. Yearly screening is stopped once you have quit smoking for at least 15 years or develop a health problem that would prevent you from having lung cancer treatment.  Clinical breast exam.** / Every year after age 48 years.  BRCA-related cancer risk assessment.** / For women who have family members with a BRCA-related cancer (breast, ovarian, tubal, or peritoneal cancers).  Mammogram.** / Every year beginning at age 41 years and continuing for as long as you are in good health. Consult with your health care provider.  Pap test.** / Every 3 years starting at age 65 years through age 37 or 70 years with a history of 3 consecutive normal Pap tests.  HPV screening.** / Every 3 years from ages 72 years through ages 60 to 40 years with a history of 3 consecutive normal Pap tests.  Fecal occult blood test (FOBT) of stool. / Every year beginning at age 21 years and continuing until age 5 years. You may not need to do this test if you get  a colonoscopy every 10 years.  Flexible sigmoidoscopy or colonoscopy.** / Every 5 years for a flexible sigmoidoscopy or every 10 years for a colonoscopy beginning at age 35 years and continuing until age 48 years.  Hepatitis C blood test.** / For all people born from [birth year] through 1965 and any individual with known risks for hepatitis C.  Skin self-exam. / Monthly.  Influenza vaccine. / Every year.  Tetanus, diphtheria, and acellular pertussis (Tdap/Td) vaccine.** / Consult your health care provider. Pregnant women should receive 1 dose of Tdap vaccine during each pregnancy. 1 dose of Td every 10 years.  Varicella vaccine.** / Consult your health care provider. Pregnant females who do not have evidence of immunity should receive the first dose after pregnancy.  Zoster vaccine.** / 1 dose for adults aged 30 years or older.  Measles, mumps, rubella (MMR) vaccine.** / You need at least 1 dose of MMR if you were born in 1957 or later. You may also need a second dose. For females of childbearing age, rubella immunity should be determined. If there is no evidence of immunity, females who are not pregnant should be vaccinated. If there is no evidence of immunity, females who are pregnant should delay immunization until after pregnancy.  Pneumococcal 13-valent conjugate (PCV13) vaccine.** / Consult your health care provider.  Pneumococcal polysaccharide (PPSV23) vaccine.** / 1 to 2 doses if you smoke cigarettes or if you have certain conditions.  Meningococcal vaccine.** /  Consult your health care provider.  Hepatitis A vaccine.** / Consult your health care provider.  Hepatitis B vaccine.** / Consult your health care provider.  Haemophilus influenzae type b (Hib) vaccine.** / Consult your health care provider. Ages 64 years and over  Blood pressure check.** / Every year.  Lipid and cholesterol check.** / Every 5 years beginning at age 23 years.  Lung cancer screening. / Every year if you  are aged 16-80 years and have a 30-pack-year history of smoking and currently smoke or have quit within the past 15 years. Yearly screening is stopped once you have quit smoking for at least 15 years or develop a health problem that would prevent you from having lung cancer treatment.  Clinical breast exam.** / Every year after age 74 years.  BRCA-related cancer risk assessment.** / For women who have family members with a BRCA-related cancer (breast, ovarian, tubal, or peritoneal cancers).  Mammogram.** / Every year beginning at age 44 years and continuing for as long as you are in good health. Consult with your health care provider.  Pap test.** / Every 3 years starting at age 58 years through age 22 or 39 years with 3 consecutive normal Pap tests. Testing can be stopped between 65 and 70 years with 3 consecutive normal Pap tests and no abnormal Pap or HPV tests in the past 10 years.  HPV screening.** / Every 3 years from ages 64 years through ages 70 or 61 years with a history of 3 consecutive normal Pap tests. Testing can be stopped between 65 and 70 years with 3 consecutive normal Pap tests and no abnormal Pap or HPV tests in the past 10 years.  Fecal occult blood test (FOBT) of stool. / Every year beginning at age 40 years and continuing until age 27 years. You may not need to do this test if you get a colonoscopy every 10 years.  Flexible sigmoidoscopy or colonoscopy.** / Every 5 years for a flexible sigmoidoscopy or every 10 years for a colonoscopy beginning at age 7 years and continuing until age 32 years.  Hepatitis C blood test.** / For all people born from [birth year] through 1965 and any individual with known risks for hepatitis C.  Osteoporosis screening.** / A one-time screening for women ages 30 years and over and women at risk for fractures or osteoporosis.  Skin self-exam. / Monthly.  Influenza vaccine. / Every year.  Tetanus, diphtheria, and acellular pertussis (Tdap/Td)  vaccine.** / 1 dose of Td every 10 years.  Varicella vaccine.** / Consult your health care provider.  Zoster vaccine.** / 1 dose for adults aged 35 years or older.  Pneumococcal 13-valent conjugate (PCV13) vaccine.** / Consult your health care provider.  Pneumococcal polysaccharide (PPSV23) vaccine.** / 1 dose for all adults aged 46 years and older.  Meningococcal vaccine.** / Consult your health care provider.  Hepatitis A vaccine.** / Consult your health care provider.  Hepatitis B vaccine.** / Consult your health care provider.  Haemophilus influenzae type b (Hib) vaccine.** / Consult your health care provider. ** Family history and personal history of risk and conditions may change your health care provider's recommendations.   This information is not intended to replace advice given to you by your health care provider. Make sure you discuss any questions you have with your health care provider.   Document Released: 10/25/2001 Document Revised: 09/19/2014 Document Reviewed: 01/24/2011 Elsevier Interactive Patient Education [employer].

## 2015-07-20 NOTE — Progress Notes (Signed)
Pre visit review using our clinic review tool, if applicable. No additional management support is needed unless otherwise documented below in the visit note. 

## 2015-07-20 NOTE — Progress Notes (Signed)
Subjective:     Nicole Blackburn is a 54 y.o. female and is here for a comprehensive physical exam. The patient reports problems - dx with htn in ED after nose bleed -- 9/20.  Social History   Social History  . Marital Status: Married    Spouse Name: N/A  . Number of Children: N/A  . Years of Education: N/A   Occupational History  . [employer]--director marketing    Social History Main Topics  . Smoking status: Never Smoker   . Smokeless tobacco: Never Used  . Alcohol Use: 1.8 oz/week    3 Standard drinks or equivalent per week     Comment: Socially  . Drug Use: No  . Sexual Activity:    Partners: Male    Birth Control/ Protection: Pill   Other Topics Concern  . Not on file   Social History Narrative   Exercise-- elliptical   Health Maintenance  Topic Date Due  . Hepatitis C Screening  1961-04-22  . HIV Screening  05/10/1976  . INFLUENZA VACCINE  04/13/2015  . MAMMOGRAM  04/29/2017  . PAP SMEAR  02/23/2018  . COLONOSCOPY  03/01/2019  . TETANUS/TDAP  06/09/2019    The following portions of the patient's history were reviewed and updated as appropriate:  She  has a past medical history of Abnormal pap (1997/1998); Anemia; and Hypertension. She  does not have any pertinent problems on file. She  has past surgical history that includes Refractive surgery (Bilateral, 2012) and Wisdom tooth extraction (age 87's). Her family history includes COPD in her mother; Cancer in her paternal grandfather; Cancer (age of onset: 17) in her paternal grandmother; Colon polyps in her father; Diabetes in her father; Hypertension in her brother, father, and sister; Osteoporosis in her mother; Pulmonary embolism in her mother; Stroke (age of onset: 78) in her maternal grandmother. She  reports that she has never smoked. She has never used smokeless tobacco. She reports that she drinks about 1.8 oz of alcohol per week. She reports that she does not use illicit drugs. She has a current  medication list which includes the following prescription(s): calcium carbonate-vit d-min, cholecalciferol, multiple vitamins-minerals, naproxen sodium, psyllium, and lisinopril. Current Outpatient Prescriptions on File Prior to Visit  Medication Sig Dispense Refill  . Calcium Carbonate-Vit D-Min (CALCIUM 1200 PO) Take 1 tablet by mouth daily.     . cholecalciferol (VITAMIN D) 1000 UNITS tablet Take 1,000 Units by mouth daily.    . Multiple Vitamins-Minerals (MULTIVITAMIN PO) Take 1 tablet by mouth daily.     . naproxen sodium (ANAPROX) 220 MG tablet Take 440 mg by mouth 2 (two) times daily as needed (for pain.).    Marland Kitchen psyllium (METAMUCIL) 0.52 G capsule Take 0.52 g by mouth daily.    Marland Kitchen lisinopril (PRINIVIL,ZESTRIL) 5 MG tablet Take 1 tablet (5 mg total) by mouth daily. (Patient not taking: Reported on 07/20/2015) 30 tablet 0   No current facility-administered medications on file prior to visit.   She has No Known Allergies..  Review of Systems Review of Systems  Constitutional: Negative for activity change, appetite change and fatigue.  HENT: Negative for hearing loss, congestion, tinnitus and ear discharge.  dentist q19mEyes: Negative for visual disturbance (see optho q1y -- vision corrected to 20/20 with glasses).  Respiratory: Negative for cough, chest tightness and shortness of breath.   Cardiovascular: Negative for chest pain, palpitations and leg swelling.  Gastrointestinal: Negative for abdominal pain, diarrhea, constipation and abdominal distention.  Genitourinary:  Negative for urgency, frequency, decreased urine volume and difficulty urinating.  Musculoskeletal: Negative for back pain, arthralgias and gait problem.  Skin: Negative for color change, pallor and rash.  Neurological: Negative for dizziness, light-headedness, numbness and headaches.  Hematological: Negative for adenopathy. Does not bruise/bleed easily.  Psychiatric/Behavioral: Negative for suicidal ideas, confusion,  sleep disturbance, self-injury, dysphoric mood, decreased concentration and agitation.       Objective:    BP 156/88 mmHg  Pulse 85  Temp(Src) 98.1 F (36.7 C) (Tympanic)  Ht _0  (1.499 m)  Wt 108 lb 12.8 oz (49.351 kg)  BMI 21.96 kg/m2  SpO2 98% General appearance: alert, cooperative, appears stated age and no distress Head: Normocephalic, without obvious abnormality, atraumatic Eyes: conjunctivae/corneas clear. PERRL, EOM's intact. Fundi benign. Ears: normal TM's and external ear canals both ears Nose: Nares normal. Septum midline. Mucosa normal. No drainage or sinus tenderness. Throat: lips, mucosa, and tongue normal; teeth and gums normal Neck: no adenopathy, no carotid bruit, no JVD, supple, symmetrical, trachea midline and thyroid not enlarged, symmetric, no tenderness/mass/nodules Back: scoliosis Lungs: clear to auscultation bilaterally Breasts: gyn Heart: regular rate and rhythm, S1, S2 normal, no murmur, click, rub or gallop Abdomen: soft, non-tender; bowel sounds normal; no masses,  no organomegaly Pelvic: deferred Extremities: extremities normal, atraumatic, no cyanosis or edema Pulses: 2+ and symmetric Skin: Skin color, texture, turgor normal. No rashes or lesions Lymph nodes: Cervical, supraclavicular, and axillary nodes normal. Neurologic: Alert and oriented X 3, normal strength and tone. Normal symmetric reflexes. Normal coordination and gait Psych- no depression, no anxiety      Assessment:    Healthy female exam.     Plan:     ghm utd Check labs See After Visit Summary for Counseling Recommendations    1. Essential hypertension stable - lisinopril (PRINIVIL,ZESTRIL) 10 MG tablet; Take 1 tablet (10 mg total) by mouth daily.  Dispense: 30 tablet; Refill: 2 - Comp Met (CMET) - CBC with Differential/Platelet - Lipid panel - POCT urinalysis dipstick - TSH  2. Preventative health care   - Comp Met (CMET) - CBC with Differential/Platelet -  Lipid panel - POCT urinalysis dipstick - TSH  3. Need for hepatitis C screening test   - Hepatitis C antibody  4. Screening for HIV (human immunodeficiency virus)   - HIV antibody

## 2015-07-21 LAB — LIPID PANEL
CHOLESTEROL: 248 mg/dL — AB (ref 0–200)
HDL: 84.1 mg/dL (ref >39.00)
LDL Cholesterol: 140 mg/dL — ABNORMAL HIGH (ref 0–99)
NonHDL: 164.12
TRIGLYCERIDES: 119 mg/dL (ref 0.0–149.0)
Total CHOL/HDL Ratio: 3
VLDL: 23.8 mg/dL (ref 0.0–40.0)

## 2015-07-21 LAB — CBC WITH DIFFERENTIAL/PLATELET
BASOS ABS: 0 10*3/uL (ref 0.0–0.1)
Basophils Relative: 0.6 % (ref 0.0–3.0)
Eosinophils Absolute: 0 10*3/uL (ref 0.0–0.7)
Eosinophils Relative: 1 % (ref 0.0–5.0)
HCT: 39.7 % (ref 36.0–46.0)
HEMOGLOBIN: 13.3 g/dL (ref 12.0–15.0)
Lymphocytes Relative: 41.6 % (ref 12.0–46.0)
Lymphs Abs: 2.1 10*3/uL (ref 0.7–4.0)
MCHC: 33.5 g/dL (ref 30.0–36.0)
MCV: 95 fl (ref 78.0–100.0)
MONOS PCT: 7.5 % (ref 3.0–12.0)
Monocytes Absolute: 0.4 10*3/uL (ref 0.1–1.0)
Neutro Abs: 2.5 10*3/uL (ref 1.4–7.7)
Neutrophils Relative %: 49.3 % (ref 43.0–77.0)
PLATELETS: 227 10*3/uL (ref 150.0–400.0)
RBC: 4.18 Mil/uL (ref 3.87–5.11)
RDW: 13 % (ref 11.5–15.5)
WBC: 5.1 10*3/uL (ref 4.0–10.5)

## 2015-07-21 LAB — COMPREHENSIVE METABOLIC PANEL
ALK PHOS: 85 U/L (ref 39–117)
ALT: 18 U/L (ref 0–35)
AST: 24 U/L (ref 0–37)
Albumin: 4.9 g/dL (ref 3.5–5.2)
BILIRUBIN TOTAL: 0.8 mg/dL (ref 0.2–1.2)
BUN: 16 mg/dL (ref 6–23)
CALCIUM: 10.7 mg/dL — AB (ref 8.4–10.5)
CO2: 30 mEq/L (ref 19–32)
CREATININE: 0.67 mg/dL (ref 0.40–1.20)
Chloride: 104 mEq/L (ref 96–112)
GFR: 97.42 mL/min (ref >60.00)
GLUCOSE: 82 mg/dL (ref 70–99)
Potassium: 4.4 mEq/L (ref 3.5–5.1)
Sodium: 147 mEq/L — ABNORMAL HIGH (ref 135–145)
TOTAL PROTEIN: 7.9 g/dL (ref 6.0–8.3)

## 2015-07-21 LAB — HIV ANTIBODY (ROUTINE TESTING W REFLEX): HIV: NONREACTIVE

## 2015-07-21 LAB — HEPATITIS C ANTIBODY: HCV AB: NEGATIVE

## 2015-07-21 LAB — TSH: TSH: 4.23 u[IU]/mL (ref 0.35–4.50)

## 2015-08-13 ENCOUNTER — Ambulatory Visit: Payer: Managed Care, Other (non HMO) | Admitting: Medical

## 2015-09-16 ENCOUNTER — Other Ambulatory Visit: Payer: Self-pay

## 2015-09-16 DIAGNOSIS — I10 Essential (primary) hypertension: Secondary | ICD-10-CM

## 2015-09-16 MED ORDER — LISINOPRIL 10 MG PO TABS: 10.0000 mg | ORAL_TABLET | Freq: Every day | ORAL | Status: DC

## 2015-09-28 ENCOUNTER — Telehealth: Payer: Self-pay | Admitting: Family Medicine

## 2015-10-14 NOTE — Telephone Encounter (Signed)
.  mychart

## 2016-02-23 ENCOUNTER — Emergency Department (HOSPITAL_COMMUNITY): Payer: Managed Care, Other (non HMO)

## 2016-02-23 ENCOUNTER — Emergency Department (HOSPITAL_COMMUNITY)
Admission: EM | Admit: 2016-02-23 | Discharge: 2016-02-23 | Disposition: A | Discharge status: Home / Self Care | Payer: Managed Care, Other (non HMO) | Attending: Emergency Medicine | Admitting: Emergency Medicine

## 2016-02-23 ENCOUNTER — Encounter (HOSPITAL_COMMUNITY): Payer: Self-pay | Admitting: Emergency Medicine

## 2016-02-23 DIAGNOSIS — Y999 Unspecified external cause status: Secondary | ICD-10-CM | POA: Insufficient documentation

## 2016-02-23 DIAGNOSIS — Z79899 Other long term (current) drug therapy: Secondary | ICD-10-CM | POA: Insufficient documentation

## 2016-02-23 DIAGNOSIS — M25561 Pain in right knee: Secondary | ICD-10-CM | POA: Insufficient documentation

## 2016-02-23 DIAGNOSIS — I10 Essential (primary) hypertension: Secondary | ICD-10-CM | POA: Insufficient documentation

## 2016-02-23 DIAGNOSIS — Y9389 Activity, other specified: Secondary | ICD-10-CM | POA: Diagnosis not present

## 2016-02-23 DIAGNOSIS — Y92410 Unspecified street and highway as the place of occurrence of the external cause: Secondary | ICD-10-CM | POA: Diagnosis not present
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
## 2016-02-26 ENCOUNTER — Ambulatory Visit (INDEPENDENT_AMBULATORY_CARE_PROVIDER_SITE_OTHER): Payer: Managed Care, Other (non HMO) | Admitting: Nurse Practitioner

## 2016-02-26 ENCOUNTER — Encounter: Payer: Self-pay | Admitting: Nurse Practitioner

## 2016-02-26 VITALS — BP 120/80 | HR 78 | Resp 14 | Ht 59.0 in | Wt 109.0 lb

## 2016-02-26 DIAGNOSIS — N951 Menopausal and female climacteric states: Secondary | ICD-10-CM

## 2016-02-26 DIAGNOSIS — Z01419 Encounter for gynecological examination (general) (routine) without abnormal findings: Secondary | ICD-10-CM

## 2016-02-26 DIAGNOSIS — Z0000 Encounter for general adult medical examination without abnormal findings: Secondary | ICD-10-CM | POA: Diagnosis not present

## 2016-02-26 NOTE — Progress Notes (Signed)
55 y.o. G56P0011 Married  Caucasian Fe here for annual exam. No menses since stopping OCP last June.  Increase in vaso symptoms - tolerable. Symptoms are worse at night.   Some trouble getting back to sleep.  Also recent frequent nose bleeds with elevated BP. Now on med's.  Patient's last menstrual period was 02/24/2015.          Sexually active: Yes.    The current method of family planning is post menopausal status.    Exercising: Yes.    elliptical machine Smoker:  no  Health Maintenance: Pap:  02/24/15 Neg. HR HPV:neg MMG:  04/30/15 BIRADS1:neg Colonoscopy:  02/29/12 f/u 7 years  TDaP:  06/08/09  Hep C and HIV:07/20/2015 Negative Labs: PCP   reports that she has never smoked. She has never used smokeless tobacco. She reports that she drinks about 1.8 oz of alcohol per week. She reports that she does not use illicit drugs.  Past Medical History  Diagnosis Date  . Abnormal pap 1997/1998    cryo  . Anemia   . Hypertension     Past Surgical History  Procedure Laterality Date  . Refractive surgery Bilateral 2012    Dr Nile Riggs  . Wisdom tooth extraction  age 40's    Current Outpatient Prescriptions  Medication Sig Dispense Refill  . Calcium Carbonate-Vit D-Min (CALCIUM 1200 PO) Take 1 tablet by mouth daily.     . cholecalciferol (VITAMIN D) 1000 UNITS tablet Take 1,000 Units by mouth daily.    Marland Kitchen lisinopril (PRINIVIL,ZESTRIL) 10 MG tablet Take 1 tablet (10 mg total) by mouth daily. 90 tablet 1  . Multiple Vitamins-Minerals (MULTIVITAMIN PO) Take 1 tablet by mouth daily.     . psyllium (METAMUCIL) 0.52 G capsule Take 0.52 g by mouth daily.     No current facility-administered medications for this visit.    Family History  Problem Relation Age of Onset  . COPD Mother   . Osteoporosis Mother   . Pulmonary embolism Mother   . Diabetes Father   . Hypertension Father   . Colon polyps Father   . Hypertension Sister   . Hypertension Brother   . Stroke Maternal Grandmother 60  .  Cancer Paternal Grandmother 85    multiple myloma  . Cancer Paternal Grandfather     ? kind cancer    ROS:  Pertinent items are noted in HPI.  Otherwise, a comprehensive ROS was negative.  Exam:   BP 120/80 mmHg  Pulse 78  Resp 14  Ht  (1.499 m)  Wt 109 lb (49.442 kg)  BMI 22.00 kg/m2  LMP 02/24/2015 Height:  (149.9 cm) Ht Readings from Last 3 Encounters:  02/26/16  (1.499 m)  07/20/15  (1.499 m)  06/03/15  (1.499 m)    General appearance: alert, cooperative and appears stated age Head: Normocephalic, without obvious abnormality, atraumatic Neck: no adenopathy, supple, symmetrical, trachea midline and thyroid normal to inspection and palpation Lungs: clear to auscultation bilaterally Breasts: normal appearance, no masses or tenderness Heart: regular rate and rhythm Abdomen: soft, non-tender; no masses,  no organomegaly Extremities: extremities normal, atraumatic, no cyanosis or edema Skin: Skin color, texture, turgor normal. No rashes or lesions Lymph nodes: Cervical, supraclavicular, and axillary nodes normal. No abnormal inguinal nodes palpated Neurologic: Grossly normal   Pelvic: External genitalia:  no lesions              Urethra:  normal appearing urethra with no masses, tenderness or lesions  Bartholin's and Skene's: normal                 Vagina: normal appearing vagina with normal color and discharge, no lesions              Cervix: anteverted              Pap taken: No. Bimanual Exam:  Uterus:  normal size, contour, position, consistency, mobility, non-tender              Adnexa: no mass, fullness, tenderness               Rectovaginal: Confirms               Anus:  normal sphincter tone, no lesions  Chaperone present: no  A:  Well Woman with normal exam  Contraception with OCP - finished last June and no menses since Remote history of abnormal pap 1997/1998 Some vaso symptoms -  tolerable    P:   Reviewed health and wellness pertinent to exam  Pap smear as above  Mammogram is due 04/2016  Will follow with FSH and Vit D  She may take OTC Estroven prn  Counseled on breast self exam, mammography screening, adequate intake of calcium and vitamin D, diet and exercise, Kegel's exercises return annually or prn  An After Visit Summary was printed and given to the patient.

## 2016-02-26 NOTE — Patient Instructions (Signed)

## 2016-02-27 LAB — FOLLICLE STIMULATING HORMONE: FSH: 90.5 m[IU]/mL

## 2016-02-27 LAB — VITAMIN D 25 HYDROXY (VIT D DEFICIENCY, FRACTURES): Vit D, 25-Hydroxy: 75 ng/mL (ref 30–100)

## 2016-02-29 NOTE — Progress Notes (Signed)
Encounter reviewed by Dr. Rayden Scheper Amundson C. Silva.  

## 2016-03-16 ENCOUNTER — Other Ambulatory Visit: Payer: Self-pay | Admitting: Family Medicine

## 2016-03-16 DIAGNOSIS — I10 Essential (primary) hypertension: Secondary | ICD-10-CM

## 2016-03-16 NOTE — Telephone Encounter (Signed)
Per Chart her BP was elevated at her last visit and Dr.Lowne wanted to see her back. Please offer this patient an apt.    KP

## 2016-03-16 NOTE — Telephone Encounter (Signed)
Refill request for lisinopril. Pt says that she switched carrier to [employer].      CB: [phone]

## 2016-03-18 NOTE — Telephone Encounter (Signed)
lvm for pt to schedule F/U appt

## 2016-03-22 ENCOUNTER — Ambulatory Visit (INDEPENDENT_AMBULATORY_CARE_PROVIDER_SITE_OTHER): Payer: Managed Care, Other (non HMO) | Admitting: Family Medicine

## 2016-03-22 ENCOUNTER — Encounter: Payer: Self-pay | Admitting: Family Medicine

## 2016-03-22 VITALS — BP 122/84 | HR 70 | Temp 98.0°F | Ht 59.0 in | Wt 109.8 lb

## 2016-03-22 DIAGNOSIS — I10 Essential (primary) hypertension: Secondary | ICD-10-CM

## 2016-03-22 MED ORDER — LISINOPRIL 10 MG PO TABS: 10.0000 mg | ORAL_TABLET | Freq: Every day | ORAL | Status: DC

## 2016-03-22 MED FILL — LISINOPRIL 10 MG TABLET: 10 | 30 days supply | Qty: 30 | Fill #0

## 2016-03-22 NOTE — Progress Notes (Signed)
Pre visit review using our clinic review tool, if applicable. No additional management support is needed unless otherwise documented below in the visit note. 

## 2016-03-22 NOTE — Patient Instructions (Signed)
Hypertension Hypertension, commonly called high blood pressure, is when the force of blood pumping through your arteries is too strong. Your arteries are the blood vessels that carry blood from your heart throughout your body. A blood pressure reading consists of a higher number over a lower number, such as 110/72. The higher number (systolic) is the pressure inside your arteries when your heart pumps. The lower number (diastolic) is the pressure inside your arteries when your heart relaxes. Ideally you want your blood pressure below 120/80. Hypertension forces your heart to work harder to pump blood. Your arteries may become narrow or stiff. Having untreated or uncontrolled hypertension can cause heart attack, stroke, kidney disease, and other problems. RISK FACTORS Some risk factors for high blood pressure are controllable. Others are not.  Risk factors you cannot control include:   Race. You may be at higher risk if you are African American.  Age. Risk increases with age.  Gender. Men are at higher risk than women before age 45 years. After age 65, women are at higher risk than men. Risk factors you can control include:  Not getting enough exercise or physical activity.  Being overweight.  Getting too much fat, sugar, calories, or salt in your diet.  Drinking too much alcohol. SIGNS AND SYMPTOMS Hypertension does not usually cause signs or symptoms. Extremely high blood pressure (hypertensive crisis) may cause headache, anxiety, shortness of breath, and nosebleed. DIAGNOSIS To check if you have hypertension, your health care provider will measure your blood pressure while you are seated, with your arm held at the level of your heart. It should be measured at least twice using the same arm. Certain conditions can cause a difference in blood pressure between your right and left arms. A blood pressure reading that is higher than normal on one occasion does not mean that you need treatment. If  it is not clear whether you have high blood pressure, you may be asked to return on a different day to have your blood pressure checked again. Or, you may be asked to monitor your blood pressure at home for 1 or more weeks. TREATMENT Treating high blood pressure includes making lifestyle changes and possibly taking medicine. Living a healthy lifestyle can help lower high blood pressure. You may need to change some of your habits. Lifestyle changes may include:  Following the DASH diet. This diet is high in fruits, vegetables, and whole grains. It is low in salt, red meat, and added sugars.  Keep your sodium intake below 2,300 mg per day.  Getting at least 30-45 minutes of aerobic exercise at least 4 times per week.  Losing weight if necessary.  Not smoking.  Limiting alcoholic beverages.  Learning ways to reduce stress. Your health care provider may prescribe medicine if lifestyle changes are not enough to get your blood pressure under control, and if one of the following is true:  You are 18-59 years of age and your systolic blood pressure is above 140.  You are 60 years of age or older, and your systolic blood pressure is above 150.  Your diastolic blood pressure is above 90.  You have diabetes, and your systolic blood pressure is over 140 or your diastolic blood pressure is over 90.  You have kidney disease and your blood pressure is above 140/90.  You have heart disease and your blood pressure is above 140/90. Your personal target blood pressure may vary depending on your medical conditions, your age, and other factors. HOME CARE INSTRUCTIONS    Have your blood pressure rechecked as directed by your health care provider.   Take medicines only as directed by your health care provider. Follow the directions carefully. Blood pressure medicines must be taken as prescribed. The medicine does not work as well when you skip doses. Skipping doses also puts you at risk for  problems.  Do not smoke.   Monitor your blood pressure at home as directed by your health care provider. SEEK MEDICAL CARE IF:   You think you are having a reaction to medicines taken.  You have recurrent headaches or feel dizzy.  You have swelling in your ankles.  You have trouble with your vision. SEEK IMMEDIATE MEDICAL CARE IF:  You develop a severe headache or confusion.  You have unusual weakness, numbness, or feel faint.  You have severe chest or abdominal pain.  You vomit repeatedly.  You have trouble breathing. MAKE SURE YOU:   Understand these instructions.  Will watch your condition.  Will get help right away if you are not doing well or get worse.   This information is not intended to replace advice given to you by your health care provider. Make sure you discuss any questions you have with your health care provider.   Document Released: 08/29/2005 Document Revised: 01/13/2015 Document Reviewed: 06/21/2013 Elsevier Interactive Patient Education 2016 [employer] Inc[employer]  
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
## 2016-03-23 LAB — LIPID PANEL
CHOLESTEROL: 252 mg/dL — AB (ref 0–200)
HDL: 67.7 mg/dL (ref >39.00)
Total CHOL/HDL Ratio: 4

## 2016-03-23 LAB — COMPREHENSIVE METABOLIC PANEL
ALBUMIN: 4.4 g/dL (ref 3.5–5.2)
ALT: 15 U/L (ref 0–35)
AST: 18 U/L (ref 0–37)
Alkaline Phosphatase: 65 U/L (ref 39–117)
BUN: 26 mg/dL — ABNORMAL HIGH (ref 6–23)
CO2: 27 mEq/L (ref 19–32)
CREATININE: 0.76 mg/dL (ref 0.40–1.20)
Calcium: 9.6 mg/dL (ref 8.4–10.5)
Chloride: 105 mEq/L (ref 96–112)
GFR: 84.02 mL/min (ref >60.00)
Glucose, Bld: 87 mg/dL (ref 70–99)
POTASSIUM: 4.2 meq/L (ref 3.5–5.1)
Sodium: 139 mEq/L (ref 135–145)
Total Bilirubin: 0.5 mg/dL (ref 0.2–1.2)
Total Protein: 7.1 g/dL (ref 6.0–8.3)

## 2016-03-23 LAB — LDL CHOLESTEROL, DIRECT: LDL DIRECT: 123 mg/dL

## 2016-10-01 ENCOUNTER — Other Ambulatory Visit: Payer: Self-pay | Admitting: Family Medicine

## 2016-10-01 DIAGNOSIS — I10 Essential (primary) hypertension: Secondary | ICD-10-CM

## 2017-02-24 ENCOUNTER — Encounter: Payer: Self-pay | Admitting: Family Medicine

## 2017-02-24 ENCOUNTER — Ambulatory Visit (INDEPENDENT_AMBULATORY_CARE_PROVIDER_SITE_OTHER): Payer: Managed Care, Other (non HMO) | Admitting: Family Medicine

## 2017-02-24 VITALS — BP 118/70 | HR 73 | Temp 98.6°F | Resp 16 | Ht 59.2 in | Wt 110.0 lb

## 2017-02-24 DIAGNOSIS — Z23 Encounter for immunization: Secondary | ICD-10-CM | POA: Diagnosis not present

## 2017-02-24 DIAGNOSIS — Z0000 Encounter for general adult medical examination without abnormal findings: Secondary | ICD-10-CM | POA: Diagnosis not present

## 2017-02-24 LAB — CBC WITH DIFFERENTIAL/PLATELET
Basophils Absolute: 0 cells/uL (ref 0–200)
Basophils Relative: 0 %
EOS ABS: 102 {cells}/uL (ref 15–500)
Eosinophils Relative: 2 %
HEMATOCRIT: 37.8 % (ref 35.0–45.0)
Hemoglobin: 12.4 g/dL (ref 11.7–15.5)
Lymphocytes Relative: 33 %
Lymphs Abs: 1683 cells/uL (ref 850–3900)
MCH: 30.9 pg (ref 27.0–33.0)
MCHC: 32.8 g/dL (ref 32.0–36.0)
MCV: 94.3 fL (ref 80.0–100.0)
MONO ABS: 408 {cells}/uL (ref 200–950)
MPV: 9.9 fL (ref 7.5–12.5)
Monocytes Relative: 8 %
NEUTROS ABS: 2907 {cells}/uL (ref 1500–7800)
Neutrophils Relative %: 57 %
Platelets: 201 10*3/uL (ref 140–400)
RBC: 4.01 MIL/uL (ref 3.80–5.10)
RDW: 13.4 % (ref 11.0–15.0)
WBC: 5.1 10*3/uL (ref 3.8–10.8)

## 2017-02-24 LAB — TSH: TSH: 1.84 mIU/L

## 2017-02-24 LAB — COMPREHENSIVE METABOLIC PANEL
ALK PHOS: 73 U/L (ref 33–130)
ALT: 15 U/L (ref 6–29)
AST: 21 U/L (ref 10–35)
Albumin: 4.6 g/dL (ref 3.6–5.1)
BUN: 23 mg/dL (ref 7–25)
CALCIUM: 9.6 mg/dL (ref 8.6–10.4)
CO2: 27 mmol/L (ref 20–31)
Chloride: 103 mmol/L (ref 98–110)
Creat: 0.81 mg/dL (ref 0.50–1.05)
Glucose, Bld: 85 mg/dL (ref 65–99)
POTASSIUM: 4 mmol/L (ref 3.5–5.3)
Sodium: 140 mmol/L (ref 135–146)
Total Bilirubin: 0.7 mg/dL (ref 0.2–1.2)
Total Protein: 6.9 g/dL (ref 6.1–8.1)

## 2017-02-24 LAB — LIPID PANEL
CHOL/HDL RATIO: 2.7 ratio (ref <5.0)
CHOLESTEROL: 232 mg/dL — AB (ref <200)
HDL: 85 mg/dL (ref >50)
LDL Cholesterol: 111 mg/dL — ABNORMAL HIGH (ref <100)
Triglycerides: 178 mg/dL — ABNORMAL HIGH (ref <150)
VLDL: 36 mg/dL — ABNORMAL HIGH (ref <30)

## 2017-02-24 LAB — POC URINALSYSI DIPSTICK (AUTOMATED)
BILIRUBIN UA: NEGATIVE
Blood, UA: NEGATIVE
GLUCOSE UA: NEGATIVE
LEUKOCYTES UA: NEGATIVE
NITRITE UA: NEGATIVE
Protein, UA: NEGATIVE
Spec Grav, UA: >=1.03 — AB (ref 1.010–1.025)
Urobilinogen, UA: 0.2 E.U./dL
pH, UA: 6 (ref 5.0–8.0)

## 2017-02-24 NOTE — Patient Instructions (Signed)
Preventive Care 40-64 Years, Female Preventive care refers to lifestyle choices and visits with your health care provider that can promote health and wellness. What does preventive care include?  A yearly physical exam. This is also called an annual well check.  Dental exams once or twice a year.  Routine eye exams. Ask your health care provider how often you should have your eyes checked.  Personal lifestyle choices, including: ? Daily care of your teeth and gums. ? Regular physical activity. ? Eating a healthy diet. ? Avoiding tobacco and drug use. ? Limiting alcohol use. ? Practicing safe sex. ? Taking low-dose aspirin daily starting at age 58. ? Taking vitamin and mineral supplements as recommended by your health care provider. What happens during an annual well check? The services and screenings done by your health care provider during your annual well check will depend on your age, overall health, lifestyle risk factors, and family history of disease. Counseling Your health care provider may ask you questions about your:  Alcohol use.  Tobacco use.  Drug use.  Emotional well-being.  Home and relationship well-being.  Sexual activity.  Eating habits.  Work and work Statistician.  Method of birth control.  Menstrual cycle.  Pregnancy history.  Screening You may have the following tests or measurements:  Height, weight, and BMI.  Blood pressure.  Lipid and cholesterol levels. These may be checked every 5 years, or more frequently if you are over 81 years old.  Skin check.  Lung cancer screening. You may have this screening every year starting at age 78 if you have a 30-pack-year history of smoking and currently smoke or have quit within the past 15 years.  Fecal occult blood test (FOBT) of the stool. You may have this test every year starting at age 65.  Flexible sigmoidoscopy or colonoscopy. You may have a sigmoidoscopy every 5 years or a colonoscopy  every 10 years starting at age 30.  Hepatitis C blood test.  Hepatitis B blood test.  Sexually transmitted disease (STD) testing.  Diabetes screening. This is done by checking your blood sugar (glucose) after you have not eaten for a while (fasting). You may have this done every 1-3 years.  Mammogram. This may be done every 1-2 years. Talk to your health care provider about when you should start having regular mammograms. This may depend on whether you have a family history of breast cancer.  BRCA-related cancer screening. This may be done if you have a family history of breast, ovarian, tubal, or peritoneal cancers.  Pelvic exam and Pap test. This may be done every 3 years starting at age 80. Starting at age 36, this may be done every 5 years if you have a Pap test in combination with an HPV test.  Bone density scan. This is done to screen for osteoporosis. You may have this scan if you are at high risk for osteoporosis.  Discuss your test results, treatment options, and if necessary, the need for more tests with your health care provider. Vaccines Your health care provider may recommend certain vaccines, such as:  Influenza vaccine. This is recommended every year.  Tetanus, diphtheria, and acellular pertussis (Tdap, Td) vaccine. You may need a Td booster every 10 years.  Varicella vaccine. You may need this if you have not been vaccinated.  Zoster vaccine. You may need this after age 5.  Measles, mumps, and rubella (MMR) vaccine. You may need at least one dose of MMR if you were born in  1957 or later. You may also need a second dose.  Pneumococcal 13-valent conjugate (PCV13) vaccine. You may need this if you have certain conditions and were not previously vaccinated.  Pneumococcal polysaccharide (PPSV23) vaccine. You may need one or two doses if you smoke cigarettes or if you have certain conditions.  Meningococcal vaccine. You may need this if you have certain  conditions.  Hepatitis A vaccine. You may need this if you have certain conditions or if you travel or work in places where you may be exposed to hepatitis A.  Hepatitis B vaccine. You may need this if you have certain conditions or if you travel or work in places where you may be exposed to hepatitis B.  Haemophilus influenzae type b (Hib) vaccine. You may need this if you have certain conditions.  Talk to your health care provider about which screenings and vaccines you need and how often you need them. This information is not intended to replace advice given to you by your health care provider. Make sure you discuss any questions you have with your health care provider. Document Released: 09/25/2015 Document Revised: 05/18/2016 Document Reviewed: 06/30/2015 Elsevier Interactive Patient Education  2017 [employer].

## 2017-02-24 NOTE — Progress Notes (Signed)
Subjective:   I acted as a Neurosurgeonscribe for Dr. Zola ButtonLowne-Chase.  Nicole Blackburn, CMA   Nicole PartridgeLisa Blackburn is a 56 y.o. female and is here for a comprehensive physical exam. The patient reports no problems.  Social History   Social History  . Marital status: Married    Spouse name: N/A  . Number of children: N/A  . Years of education: N/A   Occupational History  . [employer]--director marketing    Social History Main Topics  . Smoking status: Never Smoker  . Smokeless tobacco: Never Used  . Alcohol use 1.8 oz/week    3 Standard drinks or equivalent per week     Comment: Socially  . Drug use: No  . Sexual activity: Yes    Partners: Male    Birth control/ protection: Post-menopausal   Other Topics Concern  . Not on file   Social History Narrative   Exercise-- elliptical   Health Maintenance  Topic Date Due  . MAMMOGRAM  04/29/2016  . INFLUENZA VACCINE  04/12/2017  . PAP SMEAR  02/23/2018  . COLONOSCOPY  03/01/2019  . TETANUS/TDAP  06/09/2019  . Hepatitis C Screening  Completed  . HIV Screening  Completed    The following portions of the patient's history were reviewed and updated as appropriate:  She  has a past medical history of Abnormal pap (1997/1998); Anemia; and Hypertension. She  does not have any pertinent problems on file. She  has a past surgical history that includes Refractive surgery (Bilateral, 2012) and Wisdom tooth extraction (age 56's). Her family history includes COPD in her mother; Cancer in her paternal grandfather; Cancer (age of onset: 3870) in her paternal grandmother; Colon polyps in her father; Diabetes in her father; Hypertension in her brother, father, and sister; Osteoporosis in her mother; Pulmonary embolism in her mother; Stroke (age of onset: 5260) in her maternal grandmother. She  reports that she has never smoked. She has never used smokeless tobacco. She reports that she drinks about 1.8 oz of alcohol per week . She reports that she does not use drugs. She has  a current medication list which includes the following prescription(s): calcium carbonate-vit d-min, lisinopril, multiple vitamins-minerals, and psyllium. Current Outpatient Prescriptions on File Prior to Visit  Medication Sig Dispense Refill  . Calcium Carbonate-Vit D-Min (CALCIUM 1200 PO) Take 1 tablet by mouth daily.     Marland Kitchen. lisinopril (PRINIVIL,ZESTRIL) 10 MG tablet Take 1 tablet (10 mg total) by mouth daily. 30 tablet 0  . Multiple Vitamins-Minerals (MULTIVITAMIN PO) Take 1 tablet by mouth daily.     . psyllium (METAMUCIL) 0.52 G capsule Take 0.52 g by mouth daily.     No current facility-administered medications on file prior to visit.    She has No Known Allergies..  Review of Systems Review of Systems  Constitutional: Negative for activity change, appetite change and fatigue.  HENT: Negative for hearing loss, congestion, tinnitus and ear discharge.  dentist [id number] Eyes: Negative for visual disturbance (see optho q1y -- vision corrected to 20/20 with glasses).  Respiratory: Negative for cough, chest tightness and shortness of breath.   Cardiovascular: Negative for chest pain, palpitations and leg swelling.  Gastrointestinal: Negative for abdominal pain, diarrhea, constipation and abdominal distention.  Genitourinary: Negative for urgency, frequency, decreased urine volume and difficulty urinating.  Musculoskeletal: Negative for back pain, arthralgias and gait problem.  Skin: Negative for color change, pallor and rash.  Neurological: Negative for dizziness, light-headedness, numbness and headaches.  Hematological: Negative for adenopathy. Does not  bruise/bleed easily.  Psychiatric/Behavioral: Negative for suicidal ideas, confusion, sleep disturbance, self-injury, dysphoric mood, decreased concentration and agitation.       Objective:    BP 118/70 (BP Location: Left Arm, Cuff Size: Normal)   Pulse 73   Temp 98.6 F (37 C) (Oral)   Resp 16   Ht 4' 11.2" (1.504 m)   Wt 110 lb  (49.9 kg)   LMP 02/24/2015   SpO2 98%   BMI 22.07 kg/m  General appearance: alert, cooperative, appears stated age and no distress Head: Normocephalic, without obvious abnormality, atraumatic Eyes: conjunctivae/corneas clear. PERRL, EOM's intact. Fundi benign. Ears: normal TM's and external ear canals both ears Nose: Nares normal. Septum midline. Mucosa normal. No drainage or sinus tenderness. Throat: lips, mucosa, and tongue normal; teeth and gums normal Neck: no adenopathy, no carotid bruit, no JVD, supple, symmetrical, trachea midline and thyroid not enlarged, symmetric, no tenderness/mass/nodules Back: symmetric, no curvature. ROM normal. No CVA tenderness. Lungs: clear to auscultation bilaterally Breasts: normal appearance, no masses or tenderness Heart: regular rate and rhythm, S1, S2 normal, no murmur, click, rub or gallop Abdomen: soft, non-tender; bowel sounds normal; no masses,  no organomegaly Pelvic: deferred Extremities: extremities normal, atraumatic, no cyanosis or edema Pulses: 2+ and symmetric Skin: Skin color, texture, turgor normal. No rashes or lesions Lymph nodes: Cervical, supraclavicular, and axillary nodes normal. Neurologic: Alert and oriented X 3, normal strength and tone. Normal symmetric reflexes. Normal coordination and gait    Assessment:    Healthy female exam.      Plan:     ghm utd See After Visit Summary for Counseling Recommendations   Check labs  1. Need for shingles vaccine   - Varicella-zoster vaccine IM (Shingrix)  2. Preventative health care See above - POCT Urinalysis Dipstick (Automated) - CBC with Differential/Platelet - Comprehensive metabolic panel - Lipid panel - TSH

## 2017-02-28 ENCOUNTER — Ambulatory Visit: Payer: Managed Care, Other (non HMO) | Admitting: Nurse Practitioner

## 2017-03-01 ENCOUNTER — Other Ambulatory Visit: Payer: Self-pay | Admitting: Family Medicine

## 2017-03-01 DIAGNOSIS — E785 Hyperlipidemia, unspecified: Secondary | ICD-10-CM

## 2017-03-22 ENCOUNTER — Encounter: Payer: Self-pay | Admitting: Obstetrics and Gynecology

## 2017-03-22 ENCOUNTER — Ambulatory Visit (INDEPENDENT_AMBULATORY_CARE_PROVIDER_SITE_OTHER): Payer: Managed Care, Other (non HMO) | Admitting: Obstetrics and Gynecology

## 2017-03-22 ENCOUNTER — Other Ambulatory Visit (HOSPITAL_COMMUNITY)
Admission: RE | Admit: 2017-03-22 | Discharge: 2017-03-22 | Disposition: A | Discharge status: Home / Self Care | Payer: Managed Care, Other (non HMO) | Source: Ambulatory Visit | Attending: Obstetrics and Gynecology | Admitting: Obstetrics and Gynecology

## 2017-03-22 VITALS — BP 98/58 | HR 72 | Resp 16 | Ht 59.0 in | Wt 111.0 lb

## 2017-03-22 DIAGNOSIS — Z124 Encounter for screening for malignant neoplasm of cervix: Secondary | ICD-10-CM | POA: Insufficient documentation

## 2017-03-22 DIAGNOSIS — Z01419 Encounter for gynecological examination (general) (routine) without abnormal findings: Secondary | ICD-10-CM | POA: Diagnosis not present

## 2017-03-22 NOTE — Progress Notes (Signed)
56 y.o. [id number] MarriedCaucasianF here for annual exam. No vaginal bleeding. Sexually active, no pain.      Patient's last menstrual period was 02/24/2015.          Sexually active: Yes.    The current method of family planning is post menopausal status.    Exercising: Yes.    elliptical  Smoker:  no  Health Maintenance: Pap:  02-24-15 WNL NEG HR HPV  History of abnormal Pap:  Yes- years ago MMG:  04-30-15 WNL  Colonoscopy:  02-29-12 WNL  BMD:   Never TDaP:  2010 Gardasil: N/A   reports that she has never smoked. She has never used smokeless tobacco. She reports that she drinks about 1.8 oz of alcohol per week . She reports that she does not use drugs. She has been married for 11 years. She has a 56 year old daughter, lives locally, not married, no grandchildren. 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   Past Medical History:  Diagnosis Date  . Abnormal pap 1997/1998   cryo  . Anemia   . Hypertension     Past Surgical History:  Procedure Laterality Date  . REFRACTIVE SURGERY Bilateral 2012   Dr Nile RiggsShapiro  . WISDOM TOOTH EXTRACTION  age 56's    Current Outpatient Prescriptions  Medication Sig Dispense Refill  . Calcium Carbonate-Vit D-Min (CALCIUM 1200 PO) Take 1 tablet by mouth daily.     Marland Kitchen. lisinopril (PRINIVIL,ZESTRIL) 10 MG tablet Take 1 tablet (10 mg total) by mouth daily. 30 tablet 0  . Multiple Vitamins-Minerals (MULTIVITAMIN PO) Take 1 tablet by mouth daily.     . psyllium (METAMUCIL) 0.52 G capsule Take 0.52 g by mouth daily.     No current facility-administered medications for this visit.     Family History  Problem Relation Age of Onset  . COPD Mother   . Osteoporosis Mother   . Pulmonary embolism Mother   . Diabetes Father   . Hypertension Father   . Colon polyps Father   . Hypertension Sister   . Hypertension Brother   . Stroke Maternal Grandmother 60  . Cancer Paternal Grandmother [id number]       multiple myloma  . Cancer Paternal Grandfather    ? kind cancer    Review of Systems  Constitutional: Negative.   HENT: Negative.   Eyes: Negative.   Respiratory: Negative.   Cardiovascular: Negative.   Gastrointestinal: Negative.   Endocrine: Negative.   Genitourinary: Negative.   Musculoskeletal: Negative.   Skin: Negative.   Allergic/Immunologic: Negative.   Neurological: Negative.   Psychiatric/Behavioral: Negative.     Exam:   BP (!) 98/58 (BP Location: Right Arm, Patient Position: Sitting, Cuff Size: Normal)   Pulse 72   Resp 16   Ht 4\' 11"  (1.499 m)   Wt 111 lb (50.3 kg)   LMP 02/24/2015   BMI 22.42 kg/m   Weight change: @WEIGHTCHANGE @ Height:   Height: 4\' 11"  (149.9 cm)  Ht Readings from Last 3 Encounters:  03/22/17 4\' 11"  (1.499 m)  02/24/17 4' 11.2" (1.504 m)  03/22/16 4\' 11"  (1.499 m)    General appearance: alert, cooperative and appears stated age Head: Normocephalic, without obvious abnormality, atraumatic Neck: no adenopathy, supple, symmetrical, trachea midline and thyroid normal to inspection and palpation Lungs: clear to auscultation bilaterally Cardiovascular: regular rate and rhythm Breasts: normal appearance, no masses or tenderness Abdomen: soft, non-tender; bowel sounds normal; no masses,  no organomegaly Extremities: extremities normal, atraumatic, no cyanosis  or edema Skin: Skin color, texture, turgor normal. No rashes or lesions Lymph nodes: Cervical, supraclavicular, and axillary nodes normal. No abnormal inguinal nodes palpated Neurologic: Grossly normal   Pelvic: External genitalia:  no lesions              Urethra:  normal appearing urethra with no masses, tenderness or lesions              Bartholins and Skenes: normal                 Vagina: normal appearing atrophic vagina with normal color and discharge, no lesions              Cervix: no lesions               Bimanual Exam:  Uterus:  normal size, contour, position, consistency, mobility, non-tender              Adnexa: no mass,  fullness, tenderness               Rectovaginal: Confirms               Anus:  normal sphincter tone, no lesions  Chaperone was present for exam.  A:  Well Woman with normal exam  P:   Mammogram next month  Colonoscopy UTD  Pap with hpv  Discussed breast self exam  Discussed calcium and vit D intake  Labs with primary MD

## 2017-03-23 ENCOUNTER — Telehealth: Payer: Self-pay | Admitting: *Deleted

## 2017-03-23 NOTE — Telephone Encounter (Signed)
Patient notified.  Verbalized understanding. 

## 2017-03-23 NOTE — Telephone Encounter (Signed)
-----   Message from Romualdo BolkJill Evelyn Jertson, MD sent at 03/22/2017  5:25 PM EDT ----- Please let the patient know that her last vit d level was from last year. It was high. I think she is probably fine with just the calcium and vit d that she is taking. I would recommend checking her vit d with her primary next year with her screening labs.  Thanks, Noreene LarssonJill

## 2017-03-24 LAB — CYTOLOGY - PAP
DIAGNOSIS: NEGATIVE
HPV: NOT DETECTED

## 2017-03-29 ENCOUNTER — Other Ambulatory Visit: Payer: Self-pay | Admitting: Family Medicine

## 2017-03-29 DIAGNOSIS — I10 Essential (primary) hypertension: Secondary | ICD-10-CM

## 2017-04-28 ENCOUNTER — Encounter: Payer: Self-pay | Admitting: Obstetrics and Gynecology

## 2017-05-04 ENCOUNTER — Ambulatory Visit (INDEPENDENT_AMBULATORY_CARE_PROVIDER_SITE_OTHER): Payer: Managed Care, Other (non HMO) | Admitting: Behavioral Health

## 2017-05-04 DIAGNOSIS — Z23 Encounter for immunization: Secondary | ICD-10-CM

## 2017-05-04 NOTE — Progress Notes (Signed)
Pre visit review using our clinic review tool, if applicable. No additional management support is needed unless otherwise documented below in the visit note.  Patient came in clinic today for 2nd Shingrix vaccination. IM injection was given in the left deltoid. Patient tolerated injection well.

## 2017-06-20 ENCOUNTER — Encounter: Payer: Self-pay | Admitting: Family Medicine

## 2017-06-20 MED ORDER — LISINOPRIL 5 MG PO TABS: 5.0000 mg | ORAL_TABLET | Freq: Every day | ORAL | 2 refills | Status: DC

## 2017-06-20 NOTE — Telephone Encounter (Signed)
Can she come for bp check?  Ok to send lisinopril 5 mg #30  2 refills

## 2017-08-14 ENCOUNTER — Ambulatory Visit: Payer: Managed Care, Other (non HMO) | Admitting: Family Medicine

## 2017-08-23 ENCOUNTER — Ambulatory Visit (HOSPITAL_BASED_OUTPATIENT_CLINIC_OR_DEPARTMENT_OTHER)
Admission: RE | Admit: 2017-08-23 | Discharge: 2017-08-23 | Disposition: A | Discharge status: Home / Self Care | Payer: Managed Care, Other (non HMO) | Source: Ambulatory Visit | Attending: Family | Admitting: Family

## 2017-08-23 ENCOUNTER — Encounter: Payer: Self-pay | Admitting: Family

## 2017-08-23 ENCOUNTER — Telehealth: Payer: Self-pay | Admitting: Family

## 2017-08-23 ENCOUNTER — Ambulatory Visit: Payer: Managed Care, Other (non HMO) | Admitting: Family

## 2017-08-23 VITALS — BP 127/80 | HR 71 | Temp 98.3°F | Resp 16 | Ht 59.0 in | Wt 110.0 lb

## 2017-08-23 DIAGNOSIS — S52126A Nondisplaced fracture of head of unspecified radius, initial encounter for closed fracture: Secondary | ICD-10-CM

## 2017-08-23 DIAGNOSIS — R937 Abnormal findings on diagnostic imaging of other parts of musculoskeletal system: Secondary | ICD-10-CM | POA: Insufficient documentation

## 2017-08-23 DIAGNOSIS — M25521 Pain in right elbow: Secondary | ICD-10-CM | POA: Diagnosis not present

## 2017-08-23 DIAGNOSIS — M25461 Effusion, right knee: Secondary | ICD-10-CM | POA: Diagnosis not present

## 2017-08-23 NOTE — Telephone Encounter (Signed)
Attempted to reach pt- no answer advised her to check mychart message.   her know that x ray does show probable fracture. I will place referral to orthopedics.

## 2017-08-23 NOTE — Patient Instructions (Addendum)
Please complete x ray on the first floor.  We will contact you with your results.   You may use tylenol or motrin as needed for pain.

## 2017-08-23 NOTE — Progress Notes (Signed)
Subjective:    Patient ID: Nicole PartridgeLisa Blackburn, female    DOB: 12-30-1960, 56 y.o.   MRN: [id number]  HPI  Ms. Nicole Blackburn is a 56 yr old female who presents today with chief complaint of right elbow pain. Pain began on 08/12/17 following a fall. Occurred while dancing- slipped on the dance floor. Reports that initially it started to feel better but still has trouble putting pressure on her arm.  Hurts with certain movements.   Review of Systems See HPI  Past Medical History:  Diagnosis Date  . Abnormal pap 1997/1998   cryo  . Anemia   . Hypertension      Social History   Socioeconomic History  . Marital status: Married    Spouse name: Not on file  . Number of children: Not on file  . Years of education: Not on file  . Highest education level: Not on file  Social Needs  . Financial resource strain: Not on file  . Food insecurity - worry: Not on file  . Food insecurity - inability: Not on file  . Transportation needs - medical: Not on file  . Transportation needs - non-medical: Not on file  Occupational History  . 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 Smoking status: Never Smoker  . Smokeless tobacco: Never Used  Substance and Sexual Activity  . Alcohol use: Yes    Alcohol/week: 1.8 oz    Types: 3 Standard drinks or equivalent per week    Comment: Socially  . Drug use: No  . Sexual activity: Yes    Partners: Male    Birth control/protection: Post-menopausal  Other Topics Concern  . Not on file  Social History Narrative   Exercise-- elliptical    Past Surgical History:  Procedure Laterality Date  . REFRACTIVE SURGERY Bilateral 2012   Dr Nile RiggsShapiro  . WISDOM TOOTH EXTRACTION  age 56's    Family History  Problem Relation Age of Onset  . COPD Mother   . Osteoporosis Mother   . Pulmonary embolism Mother   . Diabetes Father   . Hypertension Father   . Colon polyps Father   . Hypertension Sister   . Hypertension Brother   . Stroke Maternal  Grandmother 60  . Cancer Paternal Grandmother [id number]       multiple myloma  . Cancer Paternal Grandfather        ? kind cancer    No Known Allergies  Current Outpatient Medications on File Prior to Visit  Medication Sig Dispense Refill  . Calcium Carbonate-Vit D-Min (CALCIUM 1200 PO) Take 1 tablet by mouth daily.     Marland Kitchen. lisinopril (PRINIVIL,ZESTRIL) 5 MG tablet Take 1 tablet (5 mg total) by mouth daily. 30 tablet 2  . Multiple Vitamins-Minerals (MULTIVITAMIN PO) Take 1 tablet by mouth daily.     . psyllium (METAMUCIL) 0.52 G capsule Take 0.52 g by mouth daily.     No current facility-administered medications on file prior to visit.     BP 127/80 (BP Location: Right Arm, Patient Position: Sitting, Cuff Size: Small)   Pulse 71   Temp 98.3 F (36.8 C) (Oral)   Resp 16   Ht 4\' 11"  (1.499 m)   Wt 110 lb (49.9 kg)   LMP 02/24/2015   SpO2 100%   BMI 22.22 kg/m       Objective:   Physical Exam  Constitutional: She is oriented to person, place, and time. She appears well-developed and well-nourished.  HENT:  Head:  Normocephalic and atraumatic.  Cardiovascular: Normal rate, regular rhythm and normal heart sounds.  No murmur heard. Pulmonary/Chest: Effort normal and breath sounds normal. No respiratory distress. She has no wheezes.  Musculoskeletal: She exhibits no edema.  Right elbow is without swelling, tenderness or erythema. Full ROM is noted  Neurological: She is alert and oriented to person, place, and time.  Psychiatric: She has a normal mood and affect. Her behavior is normal. Judgment and thought content normal.          Assessment & Plan:  Right elbow pain- will obtain x ray to rule out fracture. In the meantime, pt is advised to use tylenol or motrin as needed for pain.

## 2017-08-28 ENCOUNTER — Other Ambulatory Visit: Payer: Self-pay | Admitting: *Deleted

## 2017-08-28 MED ORDER — LISINOPRIL 5 MG PO TABS: 5.0000 mg | ORAL_TABLET | Freq: Every day | ORAL | 1 refills | Status: DC

## 2018-02-09 ENCOUNTER — Encounter: Payer: Self-pay | Admitting: Family Medicine

## 2018-02-09 MED ORDER — LISINOPRIL 5 MG PO TABS: 5.0000 mg | ORAL_TABLET | Freq: Every day | ORAL | 0 refills | Status: DC

## 2018-03-02 ENCOUNTER — Ambulatory Visit (INDEPENDENT_AMBULATORY_CARE_PROVIDER_SITE_OTHER): Payer: Managed Care, Other (non HMO) | Admitting: Family Medicine

## 2018-03-02 ENCOUNTER — Encounter: Payer: Self-pay | Admitting: Family Medicine

## 2018-03-02 VITALS — BP 124/72 | HR 75 | Temp 98.7°F | Resp 16 | Ht 59.0 in | Wt 109.2 lb

## 2018-03-02 DIAGNOSIS — Z0000 Encounter for general adult medical examination without abnormal findings: Secondary | ICD-10-CM

## 2018-03-02 NOTE — Progress Notes (Signed)
Subjective:     Nicole Blackburn is a 57 y.o. female and is here for a comprehensive physical exam. The patient reports no problems.  Social History   Socioeconomic History  . Marital status: Married    Spouse name: Not on file  . Number of children: Not on file  . Years of education: Not on file  . Highest education level: Not on file  Occupational History  . Occupation: [employer] Needs  . Financial resource strain: Not on file  . Food insecurity:    Worry: Not on file    Inability: Not on file  . Transportation needs:    Medical: Not on file    Non-medical: Not on file  Tobacco Use  . Smoking status: Never Smoker  . Smokeless tobacco: Never Used  Substance and Sexual Activity  . Alcohol use: Yes    Alcohol/week: 1.8 oz    Types: 3 Standard drinks or equivalent per week    Comment: Socially  . Drug use: No  . Sexual activity: Yes    Partners: Male    Birth control/protection: Post-menopausal  Lifestyle  . Physical activity:    Days per week: Not on file    Minutes per session: Not on file  . Stress: Not on file  Relationships  . Social connections:    Talks on phone: Not on file    Gets together: Not on file    Attends religious service: Not on file    Active member of club or organization: Not on file    Attends meetings of clubs or organizations: Not on file    Relationship status: Not on file  . Intimate partner violence:    Fear of current or ex partner: Not on file    Emotionally abused: Not on file    Physically abused: Not on file    Forced sexual activity: Not on file  Other Topics Concern  . Not on file  Social History Narrative   Exercise-- elliptical   Health Maintenance  Topic Date Due  . INFLUENZA VACCINE  04/12/2018  . MAMMOGRAM  04/28/2018  . COLONOSCOPY  03/01/2019  . TETANUS/TDAP  06/09/2019  . PAP SMEAR  03/22/2020  . Hepatitis C Screening  Completed  . HIV Screening  Completed    The following portions of  the patient's history were reviewed and updated as appropriate:  She  has a past medical history of Abnormal pap (1997/1998), Anemia, and Hypertension. She does not have any pertinent problems on file. She  has a past surgical history that includes Refractive surgery (Bilateral, 2012) and Wisdom tooth extraction (age 41's). Her family history includes COPD in her mother; Cancer in her paternal grandfather; Cancer (age of onset: 54) in her paternal grandmother; Colon polyps in her father; Diabetes in her father; Hypertension in her brother, father, and sister; Osteoporosis in her mother; Pulmonary embolism in her mother; Stroke (age of onset: 18) in her maternal grandmother. She  reports that she has never smoked. She has never used smokeless tobacco. She reports that she drinks about 1.8 oz of alcohol per week. She reports that she does not use drugs. She has a current medication list which includes the following prescription(s): calcium carbonate-vit d-min, lisinopril, multiple vitamins-minerals, and psyllium. Current Outpatient Medications on File Prior to Visit  Medication Sig Dispense Refill  . Calcium Carbonate-Vit D-Min (CALCIUM 1200 PO) Take 1 tablet by mouth daily.     Marland Kitchen lisinopril (PRINIVIL,ZESTRIL) 5 MG tablet  Take 1 tablet (5 mg total) by mouth daily. 90 tablet 0  . Multiple Vitamins-Minerals (MULTIVITAMIN PO) Take 1 tablet by mouth daily.     . psyllium (METAMUCIL) 0.52 G capsule Take 0.52 g by mouth daily.     No current facility-administered medications on file prior to visit.    She has No Known Allergies..  Review of Systems Review of Systems  Constitutional: Negative for activity change, appetite change and fatigue.  HENT: Negative for hearing loss, congestion, tinnitus and ear discharge.  dentist [id number] Eyes: Negative for visual disturbance (see optho q1y -- vision corrected to 20/20 with glasses).  Respiratory: Negative for cough, chest tightness and shortness of breath.    Cardiovascular: Negative for chest pain, palpitations and leg swelling.  Gastrointestinal: Negative for abdominal pain, diarrhea, constipation and abdominal distention.  Genitourinary: Negative for urgency, frequency, decreased urine volume and difficulty urinating.  Musculoskeletal: Negative for back pain, arthralgias and gait problem.  Skin: Negative for color change, pallor and rash.  Neurological: Negative for dizziness, light-headedness, numbness and headaches.  Hematological: Negative for adenopathy. Does not bruise/bleed easily.  Psychiatric/Behavioral: Negative for suicidal ideas, confusion, sleep disturbance, self-injury, dysphoric mood, decreased concentration and agitation.       Objective:    BP 124/72 (BP Location: Right Arm, Cuff Size: Normal)   Pulse 75   Temp 98.7 F (37.1 C) (Oral)   Resp 16   Ht 4\' 11"  (1.499 m)   Wt 109 lb 3.2 oz (49.5 kg)   LMP 02/24/2015   SpO2 99%   BMI 22.06 kg/m  General appearance: alert, cooperative, appears stated age and no distress Head: Normocephalic, without obvious abnormality, atraumatic Eyes: conjunctivae/corneas clear. PERRL, EOM's intact. Fundi benign. Ears: normal TM's and external ear canals both ears Nose: Nares normal. Septum midline. Mucosa normal. No drainage or sinus tenderness. Throat: lips, mucosa, and tongue normal; teeth and gums normal Neck: no adenopathy, no carotid bruit, no JVD, supple, symmetrical, trachea midline and thyroid not enlarged, symmetric, no tenderness/mass/nodules Back: symmetric, no curvature. ROM normal. No CVA tenderness. Lungs: clear to auscultation bilaterally Breasts: gyn Heart: regular rate and rhythm, S1, S2 normal, no murmur, click, rub or gallop Abdomen: soft, non-tender; bowel sounds normal; no masses,  no organomegaly Pelvic: deferred--gyn Extremities: extremities normal, atraumatic, no cyanosis or edema Pulses: 2+ and symmetric Skin: Skin color, texture, turgor normal. No rashes  or lesions Lymph nodes: Cervical, supraclavicular, and axillary nodes normal. Neurologic: Alert and oriented X 3, normal strength and tone. Normal symmetric reflexes. Normal coordination and gait    Assessment:    Healthy female exam.      Plan:     ghm utd See avs See After Visit Summary for Counseling Recommendations    1. Preventative health care See above - Comprehensive metabolic panel - CBC with Differential/Platelet - Lipid panel - TSH

## 2018-03-02 NOTE — Addendum Note (Signed)
Addended by: Harley AltoPRICE, KRISTY M on: 03/02/2018 03:39 PM   Modules accepted: Orders

## 2018-03-02 NOTE — Patient Instructions (Signed)
Preventive Care 40-64 Years, Female Preventive care refers to lifestyle choices and visits with your health care provider that can promote health and wellness. What does preventive care include?  A yearly physical exam. This is also called an annual well check.  Dental exams once or twice a year.  Routine eye exams. Ask your health care provider how often you should have your eyes checked.  Personal lifestyle choices, including: ? Daily care of your teeth and gums. ? Regular physical activity. ? Eating a healthy diet. ? Avoiding tobacco and drug use. ? Limiting alcohol use. ? Practicing safe sex. ? Taking low-dose aspirin daily starting at age 57. ? Taking vitamin and mineral supplements as recommended by your health care provider. What happens during an annual well check? The services and screenings done by your health care provider during your annual well check will depend on your age, overall health, lifestyle risk factors, and family history of disease. Counseling Your health care provider may ask you questions about your:  Alcohol use.  Tobacco use.  Drug use.  Emotional well-being.  Home and relationship well-being.  Sexual activity.  Eating habits.  Work and work Statistician.  Method of birth control.  Menstrual cycle.  Pregnancy history.  Screening You may have the following tests or measurements:  Height, weight, and BMI.  Blood pressure.  Lipid and cholesterol levels. These may be checked every 5 years, or more frequently if you are over 57 years old.  Skin check.  Lung cancer screening. You may have this screening every year starting at age 57 if you have a 30-pack-year history of smoking and currently smoke or have quit within the past 15 years.  Fecal occult blood test (FOBT) of the stool. You may have this test every year starting at age 65.  Flexible sigmoidoscopy or colonoscopy. You may have a sigmoidoscopy every 5 years or a colonoscopy  every 10 years starting at age 30.  Hepatitis C blood test.  Hepatitis B blood test.  Sexually transmitted disease (STD) testing.  Diabetes screening. This is done by checking your blood sugar (glucose) after you have not eaten for a while (fasting). You may have this done every 1-3 years.  Mammogram. This may be done every 1-2 years. Talk to your health care provider about when you should start having regular mammograms. This may depend on whether you have a family history of breast cancer.  BRCA-related cancer screening. This may be done if you have a family history of breast, ovarian, tubal, or peritoneal cancers.  Pelvic exam and Pap test. This may be done every 3 years starting at age 57. Starting at age 36, this may be done every 5 years if you have a Pap test in combination with an HPV test.  Bone density scan. This is done to screen for osteoporosis. You may have this scan if you are at high risk for osteoporosis.  Discuss your test results, treatment options, and if necessary, the need for more tests with your health care provider. Vaccines Your health care provider may recommend certain vaccines, such as:  Influenza vaccine. This is recommended every year.  Tetanus, diphtheria, and acellular pertussis (Tdap, Td) vaccine. You may need a Td booster every 10 years.  Varicella vaccine. You may need this if you have not been vaccinated.  Zoster vaccine. You may need this after age 5.  Measles, mumps, and rubella (MMR) vaccine. You may need at least one dose of MMR if you were born in  1957 or later. You may also need a second dose.  Pneumococcal 13-valent conjugate (PCV13) vaccine. You may need this if you have certain conditions and were not previously vaccinated.  Pneumococcal polysaccharide (PPSV23) vaccine. You may need one or two doses if you smoke cigarettes or if you have certain conditions.  Meningococcal vaccine. You may need this if you have certain  conditions.  Hepatitis A vaccine. You may need this if you have certain conditions or if you travel or work in places where you may be exposed to hepatitis A.  Hepatitis B vaccine. You may need this if you have certain conditions or if you travel or work in places where you may be exposed to hepatitis B.  Haemophilus influenzae type b (Hib) vaccine. You may need this if you have certain conditions.  Talk to your health care provider about which screenings and vaccines you need and how often you need them. This information is not intended to replace advice given to you by your health care provider. Make sure you discuss any questions you have with your health care provider. Document Released: 09/25/2015 Document Revised: 05/18/2016 Document Reviewed: 06/30/2015 Elsevier Interactive Patient Education  2018 Elsevier Inc.  

## 2018-03-03 LAB — CBC WITH DIFFERENTIAL/PLATELET
Basophils Absolute: 29 cells/uL (ref 0–200)
Basophils Relative: 0.5 %
Eosinophils Absolute: 41 cells/uL (ref 15–500)
Eosinophils Relative: 0.7 %
HCT: 35.3 % (ref 35.0–45.0)
Hemoglobin: 12.2 g/dL (ref 11.7–15.5)
Lymphs Abs: 2192 cells/uL (ref 850–3900)
MCH: 32.2 pg (ref 27.0–33.0)
MCHC: 34.6 g/dL (ref 32.0–36.0)
MCV: 93.1 fL (ref 80.0–100.0)
MPV: 10.2 fL (ref 7.5–12.5)
Monocytes Relative: 6.6 %
NEUTROS PCT: 54.4 %
Neutro Abs: 3155 cells/uL (ref 1500–7800)
PLATELETS: 217 10*3/uL (ref 140–400)
RBC: 3.79 10*6/uL — AB (ref 3.80–5.10)
RDW: 12.6 % (ref 11.0–15.0)
TOTAL LYMPHOCYTE: 37.8 %
WBC mixed population: 383 cells/uL (ref 200–950)
WBC: 5.8 10*3/uL (ref 3.8–10.8)

## 2018-03-03 LAB — COMPREHENSIVE METABOLIC PANEL
AG Ratio: 1.9 (calc) (ref 1.0–2.5)
ALBUMIN MSPROF: 4.6 g/dL (ref 3.6–5.1)
ALT: 17 U/L (ref 6–29)
AST: 20 U/L (ref 10–35)
Alkaline phosphatase (APISO): 78 U/L (ref 33–130)
BUN: 16 mg/dL (ref 7–25)
CO2: 26 mmol/L (ref 20–32)
CREATININE: 0.71 mg/dL (ref 0.50–1.05)
Calcium: 9.9 mg/dL (ref 8.6–10.4)
Chloride: 102 mmol/L (ref 98–110)
GLOBULIN: 2.4 g/dL (ref 1.9–3.7)
GLUCOSE: 78 mg/dL (ref 65–99)
POTASSIUM: 4 mmol/L (ref 3.5–5.3)
Sodium: 140 mmol/L (ref 135–146)
Total Bilirubin: 0.5 mg/dL (ref 0.2–1.2)
Total Protein: 7 g/dL (ref 6.1–8.1)

## 2018-03-03 LAB — LIPID PANEL
CHOL/HDL RATIO: 3.1 (calc) (ref <5.0)
Cholesterol: 238 mg/dL — ABNORMAL HIGH (ref <200)
HDL: 78 mg/dL (ref >50)
LDL Cholesterol (Calc): 121 mg/dL (calc) — ABNORMAL HIGH
NON-HDL CHOLESTEROL (CALC): 160 mg/dL — AB (ref <130)
Triglycerides: 243 mg/dL — ABNORMAL HIGH (ref <150)

## 2018-03-03 LAB — TSH: TSH: 1.87 mIU/L (ref 0.40–4.50)

## 2018-03-06 ENCOUNTER — Encounter: Payer: Self-pay | Admitting: Family Medicine

## 2018-03-06 DIAGNOSIS — E785 Hyperlipidemia, unspecified: Secondary | ICD-10-CM

## 2018-03-06 DIAGNOSIS — I10 Essential (primary) hypertension: Secondary | ICD-10-CM

## 2018-03-07 MED ORDER — FENOFIBRATE 160 MG PO TABS: 160.0000 mg | ORAL_TABLET | Freq: Every day | ORAL | 2 refills | Status: DC

## 2018-03-08 ENCOUNTER — Other Ambulatory Visit: Payer: Self-pay

## 2018-03-08 NOTE — Progress Notes (Signed)
Error

## 2018-03-12 ENCOUNTER — Telehealth: Payer: Self-pay

## 2018-03-12 NOTE — Telephone Encounter (Signed)
Left message on machine to call back to schedule appt.  Orders were put in on 03/07/18

## 2018-03-12 NOTE — Telephone Encounter (Signed)
Copied from CRM [phone]#[id number]. Topic: Quick Communication - See Telephone Encounter >> Mar 12, 2018  9:20 AM Luanna Coleawoud, Jessica L wrote: CRM for notification. See Telephone encounter for: 03/12/18. Pt called to schedule a lab but I did not see any lab orders in. She states that she started taking Fenofibrate and needed a lab in 34mo for this. Please advise Cb#[phone]

## 2018-03-23 ENCOUNTER — Ambulatory Visit: Payer: Managed Care, Other (non HMO) | Admitting: Family Medicine

## 2018-03-23 ENCOUNTER — Encounter: Payer: Self-pay | Admitting: Family Medicine

## 2018-03-23 VITALS — BP 122/68 | HR 78 | Temp 98.8°F | Resp 16 | Ht 59.0 in | Wt 109.0 lb

## 2018-03-23 DIAGNOSIS — M62838 Other muscle spasm: Secondary | ICD-10-CM | POA: Diagnosis not present

## 2018-03-23 DIAGNOSIS — R21 Rash and other nonspecific skin eruption: Secondary | ICD-10-CM | POA: Diagnosis not present

## 2018-03-23 DIAGNOSIS — R509 Fever, unspecified: Secondary | ICD-10-CM | POA: Diagnosis not present

## 2018-03-23 LAB — POC URINALSYSI DIPSTICK (AUTOMATED)
Bilirubin, UA: NEGATIVE
Blood, UA: NEGATIVE
GLUCOSE UA: NEGATIVE
Ketones, UA: NEGATIVE
LEUKOCYTES UA: NEGATIVE
NITRITE UA: NEGATIVE
Protein, UA: NEGATIVE
Spec Grav, UA: >=1.03 — AB (ref 1.010–1.025)
Urobilinogen, UA: 1 E.U./dL
pH, UA: 6 (ref 5.0–8.0)

## 2018-03-23 LAB — POC INFLUENZA A&B (BINAX/QUICKVUE)
Influenza A, POC: NEGATIVE
Influenza B, POC: NEGATIVE

## 2018-03-23 MED ORDER — CYCLOBENZAPRINE HCL 10 MG PO TABS: 10.0000 mg | ORAL_TABLET | Freq: Three times a day (TID) | ORAL | 0 refills | Status: DC | PRN

## 2018-03-23 NOTE — Progress Notes (Signed)
Patient ID: Masyn Fullam, female    DOB: [birth year]  Age: 57 y.o. MRN: [id number]    Subjective:  Subjective  HPI Safaa Stingley presents for headache, fever 101 yesterday and rash on her face and neck.  No NVD + body aches x 3 weeks  No congestion, sore throat  Review of Systems  Constitutional: Positive for chills, fatigue and fever. Negative for activity change, appetite change and unexpected weight change.  Respiratory: Negative for cough and shortness of breath.   Cardiovascular: Negative for chest pain and palpitations.  Musculoskeletal: Positive for myalgias. Negative for arthralgias, back pain, gait problem and joint swelling.  Skin: Positive for rash. Negative for wound.  Psychiatric/Behavioral: Negative for behavioral problems and dysphoric mood. The patient is not nervous/anxious.     History Past Medical History:  Diagnosis Date  . Abnormal pap 1997/1998   cryo  . Anemia   . Hypertension     She has a past surgical history that includes Refractive surgery (Bilateral, 2012) and Wisdom tooth extraction (age 49's).   Her family history includes COPD in her mother; Cancer in her paternal grandfather; Cancer (age of onset: 79) in her paternal grandmother; Colon polyps in her father; Diabetes in her father; Hypertension in her brother, father, and sister; Osteoporosis in her mother; Pulmonary embolism in her mother; Stroke (age of onset: 91) in her maternal grandmother.She reports that she has never smoked. She has never used smokeless tobacco. She reports that she drinks about 1.8 oz of alcohol per week. She reports that she does not use drugs.  Current Outpatient Medications on File Prior to Visit  Medication Sig Dispense Refill  . Calcium Carbonate-Vit D-Min (CALCIUM 1200 PO) Take 1 tablet by mouth daily.     . fenofibrate 160 MG tablet Take 1 tablet (160 mg total) by mouth daily. 30 tablet 2  . lisinopril (PRINIVIL,ZESTRIL) 5 MG tablet Take 1 tablet (5 mg total) by mouth daily.  90 tablet 0  . Multiple Vitamins-Minerals (MULTIVITAMIN PO) Take 1 tablet by mouth daily.     . psyllium (METAMUCIL) 0.52 G capsule Take 0.52 g by mouth daily.     No current facility-administered medications on file prior to visit.      Objective:  Objective  Physical Exam  Constitutional: She is oriented to person, place, and time. She appears well-developed and well-nourished.  HENT:  Head: Normocephalic and atraumatic.  Eyes: Conjunctivae and EOM are normal.  Neck: Normal range of motion. Neck supple. No JVD present. Carotid bruit is not present. No thyromegaly present.  Cardiovascular: Normal rate, regular rhythm and normal heart sounds.  No murmur heard. Pulmonary/Chest: Effort normal and breath sounds normal. No respiratory distress. She has no wheezes. She has no rales. She exhibits no tenderness.  Musculoskeletal: She exhibits no edema.  Neurological: She is alert and oriented to person, place, and time.  Skin: Rash noted. There is erythema.     Psychiatric: She has a normal mood and affect.  Nursing note and vitals reviewed.  BP 122/68 (BP Location: Right Arm, Patient Position: Sitting, Cuff Size: Normal)   Pulse 78   Temp 98.8 F (37.1 C) (Oral)   Resp 16   Ht 4\' 11"  (1.499 m)   Wt 109 lb (49.4 kg)   LMP 02/24/2015   SpO2 98%   BMI 22.02 kg/m  Wt Readings from Last 3 Encounters:  03/23/18 109 lb (49.4 kg)  03/02/18 109 lb 3.2 oz (49.5 kg)  08/23/17 110 lb (49.9 kg)  Lab Results  Component Value Date   WBC 5.7 03/23/2018   HGB 12.0 03/23/2018   HCT 35.6 03/23/2018   PLT 182 03/23/2018   GLUCOSE 88 03/23/2018   CHOL 238 (H) 03/02/2018   TRIG 243 (H) 03/02/2018   HDL 78 03/02/2018   LDLDIRECT 123.0 03/22/2016   LDLCALC 121 (H) 03/02/2018   ALT 98 (H) 03/23/2018   AST 128 (H) 03/23/2018   NA 142 03/23/2018   K 4.1 03/23/2018   CL 103 03/23/2018   CREATININE 0.75 03/23/2018   BUN 12 03/23/2018   CO2 26 03/23/2018   TSH 1.87 03/02/2018   Flu-  neg Dg Elbow Complete Right  Result Date: 08/23/2017 CLINICAL DATA:  Right elbow pain, fall EXAM: RIGHT ELBOW - COMPLETE 3+ VIEW COMPARISON:  None. FINDINGS: There is a large elbow joint effusion. Subtle cortical irregularity noted in the radial head concerning for radial head fracture. No subluxation or dislocation. IMPRESSION: Findings concerning for nondisplaced radial head fracture. Large joint effusion. Electronically Signed   By: Charlett NoseKevin  Dover M.D.   On: 08/23/2017 10:31     Assessment & Plan:  Plan  I am having Asencion PartridgeLisa Speir start on cyclobenzaprine. I am also having her maintain her Multiple Vitamins-Minerals (MULTIVITAMIN PO), Calcium Carbonate-Vit D-Min (CALCIUM 1200 PO), psyllium, lisinopril, and fenofibrate.  Meds ordered this encounter  Medications  . cyclobenzaprine (FLEXERIL) 10 MG tablet    Sig: Take 1 tablet (10 mg total) by mouth 3 (three) times daily as needed for muscle spasms.    Dispense:  30 tablet    Refill:  0    Problem List Items Addressed This Visit      Unprioritized   Fever   Relevant Orders   Measles/Mumps/Rubella Immunity   Rubeola antibody, IgM   POC Influenza A&B (Binax test) (Completed)   CBC with Differential/Platelet (Completed)   Comprehensive metabolic panel (Completed)   POCT Urinalysis Dipstick (Automated) (Completed)   CMV IgM   Antinuclear Antib (ANA)   Sedimentation rate (Completed)   Epstein-Barr virus VCA antibody panel   Muscle spasm    And headache Muscle relaxers per orders      Relevant Medications   cyclobenzaprine (FLEXERIL) 10 MG tablet   Rash - Primary    Probably viral but labs to r/o measles , ebv etc Tylenol, ibuprofen for fever and body aches Flu neg rto prn      Relevant Orders   Measles/Mumps/Rubella Immunity   Rubeola antibody, IgM   CBC with Differential/Platelet (Completed)   Comprehensive metabolic panel (Completed)   POCT Urinalysis Dipstick (Automated) (Completed)   CMV IgM   Antinuclear Antib (ANA)     Sedimentation rate (Completed)   Epstein-Barr virus VCA antibody panel      Follow-up: Return if symptoms worsen or fail to improve.  Donato SchultzYvonne R Lowne Chase, DO

## 2018-03-23 NOTE — Patient Instructions (Signed)
Rash A rash is a change in the color of the skin. A rash can also change the way your skin feels. There are many different conditions and factors that can cause a rash. Follow these instructions at home: Pay attention to any changes in your symptoms. Follow these instructions to help with your condition: Medicine Take or apply over-the-counter and prescription medicines only as told by your health care provider. These may include:  Corticosteroid cream.  Anti-itch lotions.  Oral antihistamines.  Skin Care  Apply cool compresses to the affected areas.  Try taking a bath with: ? Epsom salts. Follow the instructions on the packaging. You can get these at your local pharmacy or grocery store. ? Baking soda. Pour a small amount into the bath as told by your health care provider. ? Colloidal oatmeal. Follow the instructions on the packaging. You can get this at your local pharmacy or grocery store.  Try applying baking soda paste to your skin. Stir water into baking soda until it reaches a paste-like consistency.  Do not scratch or rub your skin.  Avoid covering the rash. Make sure the rash is exposed to air as much as possible. General instructions  Avoid hot showers or baths, which can make itching worse. A cold shower may help.  Avoid scented soaps, detergents, and perfumes. Use gentle soaps, detergents, perfumes, and other cosmetic products.  Avoid any substance that causes your rash. Keep a journal to help track what causes your rash. Write down: ? What you eat. ? What cosmetic products you use. ? What you drink. ? What you wear. This includes jewelry.  Keep all follow-up visits as told by your health care provider. This is important. Contact a health care provider if:  You sweat at night.  You lose weight.  You urinate more than normal.  You feel weak.  You vomit.  Your skin or the whites of your eyes look yellow (jaundice).  Your skin: ? Tingles. ? Is  numb.  Your rash: ? Does not go away after several days. ? Gets worse.  You are: ? Unusually thirsty. ? More tired than normal.  You have: ? New symptoms. ? Pain in your abdomen. ? A fever. ? Diarrhea. Get help right away if:  You develop a rash that covers all or most of your body. The rash may or may not be painful.  You develop blisters that: ? Are on top of the rash. ? Grow larger or grow together. ? Are painful. ? Are inside your nose or mouth.  You develop a rash that: ? Looks like purple pinprick-sized spots all over your body. ? Has a "bull's eye" or looks like a target. ? Is not related to sun exposure, is red and painful, and causes your skin to peel. This information is not intended to replace advice given to you by your health care provider. Make sure you discuss any questions you have with your health care provider. Document Released: 08/19/2002 Document Revised: 02/02/2016 Document Reviewed: 01/14/2015 Elsevier Interactive Patient Education  2018 [employer]  

## 2018-03-25 DIAGNOSIS — R21 Rash and other nonspecific skin eruption: Secondary | ICD-10-CM | POA: Insufficient documentation

## 2018-03-25 DIAGNOSIS — R509 Fever, unspecified: Secondary | ICD-10-CM | POA: Insufficient documentation

## 2018-03-25 DIAGNOSIS — M62838 Other muscle spasm: Secondary | ICD-10-CM | POA: Insufficient documentation

## 2018-03-25 HISTORY — DX: Rash and other nonspecific skin eruption: R21

## 2018-03-25 NOTE — Assessment & Plan Note (Signed)
Probably viral but labs to r/o measles , ebv etc Tylenol, ibuprofen for fever and body aches Flu neg rto prn

## 2018-03-25 NOTE — Assessment & Plan Note (Signed)
And headache Muscle relaxers per orders

## 2018-03-26 ENCOUNTER — Encounter: Payer: Self-pay | Admitting: Family Medicine

## 2018-03-27 ENCOUNTER — Other Ambulatory Visit: Payer: Self-pay | Admitting: *Deleted

## 2018-03-27 ENCOUNTER — Ambulatory Visit: Payer: Self-pay | Admitting: Family Medicine

## 2018-03-27 DIAGNOSIS — R748 Abnormal levels of other serum enzymes: Secondary | ICD-10-CM

## 2018-03-27 LAB — ANA: Anti Nuclear Antibody(ANA): NEGATIVE

## 2018-03-27 LAB — CBC WITH DIFFERENTIAL/PLATELET
BASOS PCT: 0.2 %
Basophils Absolute: 11 cells/uL (ref 0–200)
EOS ABS: 211 {cells}/uL (ref 15–500)
Eosinophils Relative: 3.7 %
HEMATOCRIT: 35.6 % (ref 35.0–45.0)
Hemoglobin: 12 g/dL (ref 11.7–15.5)
Lymphs Abs: 599 cells/uL — ABNORMAL LOW (ref 850–3900)
MCH: 32 pg (ref 27.0–33.0)
MCHC: 33.7 g/dL (ref 32.0–36.0)
MCV: 94.9 fL (ref 80.0–100.0)
MPV: 10.5 fL (ref 7.5–12.5)
Monocytes Relative: 3.5 %
NEUTROS ABS: 4680 {cells}/uL (ref 1500–7800)
Neutrophils Relative %: 82.1 %
Platelets: 182 10*3/uL (ref 140–400)
RBC: 3.75 10*6/uL — AB (ref 3.80–5.10)
RDW: 12.2 % (ref 11.0–15.0)
TOTAL LYMPHOCYTE: 10.5 %
WBC: 5.7 10*3/uL (ref 3.8–10.8)
WBCMIX: 200 {cells}/uL (ref 200–950)

## 2018-03-27 LAB — COMPREHENSIVE METABOLIC PANEL
AG Ratio: 1.9 (calc) (ref 1.0–2.5)
ALBUMIN MSPROF: 4.6 g/dL (ref 3.6–5.1)
ALT: 98 U/L — AB (ref 6–29)
AST: 128 U/L — ABNORMAL HIGH (ref 10–35)
Alkaline phosphatase (APISO): 118 U/L (ref 33–130)
BUN: 12 mg/dL (ref 7–25)
CO2: 26 mmol/L (ref 20–32)
Calcium: 9.8 mg/dL (ref 8.6–10.4)
Chloride: 103 mmol/L (ref 98–110)
Creat: 0.75 mg/dL (ref 0.50–1.05)
GLUCOSE: 88 mg/dL (ref 65–99)
Globulin: 2.4 g/dL (calc) (ref 1.9–3.7)
Potassium: 4.1 mmol/L (ref 3.5–5.3)
SODIUM: 142 mmol/L (ref 135–146)
Total Bilirubin: 0.6 mg/dL (ref 0.2–1.2)
Total Protein: 7 g/dL (ref 6.1–8.1)

## 2018-03-27 LAB — EPSTEIN-BARR VIRUS VCA ANTIBODY PANEL

## 2018-03-27 LAB — MEASLES/MUMPS/RUBELLA IMMUNITY
Mumps IgG: 72.4 AU/mL
Rubella: 2.24 index
Rubeola IgG: >300 AU/mL

## 2018-03-27 LAB — RUBEOLA ANTIBODY, IGM: Measles Ab IgM, IFA: <1:20 {titer}

## 2018-03-27 LAB — CMV IGM: CMV IgM: <30 AU/mL

## 2018-03-27 LAB — SEDIMENTATION RATE: SED RATE: 31 mm/h — AB (ref 0–30)

## 2018-03-27 NOTE — Telephone Encounter (Signed)
Pt has some questions about her recent lab work, and would like a call back from a nurse.     Patient wants to know if she has active mono and what her treatment needs to be. Patient does not have temperature and her rash is pretty much gone. She does report fatigue and she feels no energy. She is trying to work- but she feels so run down. Best number is [phone]  Reason for Disposition . [1] Caller requesting NON-URGENT health information AND [2] PCP's office is the best resource  Protocols used: INFORMATION ONLY CALL-A-AH

## 2018-03-27 NOTE — Telephone Encounter (Signed)
Pt. Requesting feedback on her mono diagnosis. Routed to Dr. Laury AxonLowne to provide recommendations.

## 2018-03-27 NOTE — Telephone Encounter (Signed)
See lab

## 2018-04-02 ENCOUNTER — Other Ambulatory Visit (INDEPENDENT_AMBULATORY_CARE_PROVIDER_SITE_OTHER): Payer: Managed Care, Other (non HMO)

## 2018-04-02 DIAGNOSIS — R748 Abnormal levels of other serum enzymes: Secondary | ICD-10-CM

## 2018-04-03 LAB — COMPREHENSIVE METABOLIC PANEL
ALT: 81 U/L — ABNORMAL HIGH (ref 0–35)
AST: 33 U/L (ref 0–37)
Albumin: 4.7 g/dL (ref 3.5–5.2)
Alkaline Phosphatase: 167 U/L — ABNORMAL HIGH (ref 39–117)
BILIRUBIN TOTAL: 0.6 mg/dL (ref 0.2–1.2)
BUN: 23 mg/dL (ref 6–23)
CHLORIDE: 101 meq/L (ref 96–112)
CO2: 29 meq/L (ref 19–32)
Calcium: 10.5 mg/dL (ref 8.4–10.5)
Creatinine, Ser: 1.12 mg/dL (ref 0.40–1.20)
GFR: 53.31 mL/min — AB (ref >60.00)
Glucose, Bld: 88 mg/dL (ref 70–99)
POTASSIUM: 4.5 meq/L (ref 3.5–5.1)
Sodium: 140 mEq/L (ref 135–145)
TOTAL PROTEIN: 7.6 g/dL (ref 6.0–8.3)

## 2018-05-07 ENCOUNTER — Other Ambulatory Visit: Payer: Self-pay | Admitting: Family Medicine

## 2018-05-29 ENCOUNTER — Other Ambulatory Visit (INDEPENDENT_AMBULATORY_CARE_PROVIDER_SITE_OTHER): Payer: Managed Care, Other (non HMO)

## 2018-05-29 DIAGNOSIS — I10 Essential (primary) hypertension: Secondary | ICD-10-CM | POA: Diagnosis not present

## 2018-05-29 DIAGNOSIS — E785 Hyperlipidemia, unspecified: Secondary | ICD-10-CM

## 2018-05-30 LAB — COMPREHENSIVE METABOLIC PANEL
ALK PHOS: 60 U/L (ref 39–117)
ALT: 48 U/L — AB (ref 0–35)
AST: 48 U/L — AB (ref 0–37)
Albumin: 4.7 g/dL (ref 3.5–5.2)
BUN: 21 mg/dL (ref 6–23)
CO2: 27 mEq/L (ref 19–32)
Calcium: 10.2 mg/dL (ref 8.4–10.5)
Chloride: 104 mEq/L (ref 96–112)
Creatinine, Ser: 0.79 mg/dL (ref 0.40–1.20)
GFR: 79.71 mL/min (ref >60.00)
GLUCOSE: 82 mg/dL (ref 70–99)
Potassium: 4.1 mEq/L (ref 3.5–5.1)
Sodium: 140 mEq/L (ref 135–145)
TOTAL PROTEIN: 7.2 g/dL (ref 6.0–8.3)
Total Bilirubin: 0.7 mg/dL (ref 0.2–1.2)

## 2018-05-30 LAB — LIPID PANEL
CHOL/HDL RATIO: 2
Cholesterol: 205 mg/dL — ABNORMAL HIGH (ref 0–200)
HDL: 89 mg/dL (ref >39.00)
LDL CALC: 94 mg/dL (ref 0–99)
NONHDL: 116.02
Triglycerides: 109 mg/dL (ref 0.0–149.0)
VLDL: 21.8 mg/dL (ref 0.0–40.0)

## 2018-06-01 ENCOUNTER — Other Ambulatory Visit: Payer: Self-pay | Admitting: Family Medicine

## 2018-06-05 NOTE — Addendum Note (Signed)
Addended byConrad Newell: Marlina Cataldi D on: 06/05/2018 11:54 AM   Modules accepted: Orders

## 2018-06-13 ENCOUNTER — Encounter: Payer: Self-pay | Admitting: *Deleted

## 2018-06-26 ENCOUNTER — Encounter: Payer: Self-pay | Admitting: Obstetrics and Gynecology

## 2018-08-27 ENCOUNTER — Other Ambulatory Visit: Payer: Self-pay | Admitting: Family Medicine

## 2018-09-10 ENCOUNTER — Ambulatory Visit: Payer: Managed Care, Other (non HMO) | Admitting: Family Medicine

## 2018-09-18 ENCOUNTER — Ambulatory Visit: Payer: Managed Care, Other (non HMO) | Admitting: Family Medicine

## 2018-09-28 ENCOUNTER — Ambulatory Visit: Payer: Managed Care, Other (non HMO) | Admitting: Family Medicine

## 2018-09-28 ENCOUNTER — Encounter: Payer: Self-pay | Admitting: Family Medicine

## 2018-09-28 ENCOUNTER — Ambulatory Visit (HOSPITAL_BASED_OUTPATIENT_CLINIC_OR_DEPARTMENT_OTHER)
Admission: RE | Admit: 2018-09-28 | Discharge: 2018-09-28 | Disposition: A | Discharge status: Home / Self Care | Payer: Managed Care, Other (non HMO) | Source: Ambulatory Visit | Attending: Family Medicine | Admitting: Family Medicine

## 2018-09-28 VITALS — BP 114/60 | HR 68 | Temp 98.0°F | Ht 59.0 in | Wt 105.6 lb

## 2018-09-28 DIAGNOSIS — M25551 Pain in right hip: Secondary | ICD-10-CM

## 2018-09-28 DIAGNOSIS — Z8739 Personal history of other diseases of the musculoskeletal system and connective tissue: Secondary | ICD-10-CM

## 2018-09-28 DIAGNOSIS — R42 Dizziness and giddiness: Secondary | ICD-10-CM | POA: Diagnosis not present

## 2018-09-28 DIAGNOSIS — T7840XA Allergy, unspecified, initial encounter: Secondary | ICD-10-CM | POA: Diagnosis not present

## 2018-09-28 MED ORDER — MELOXICAM 15 MG PO TABS: ORAL_TABLET | ORAL | 1 refills | Status: DC

## 2018-09-28 NOTE — Patient Instructions (Signed)
How to Perform the Epley Maneuver  The Epley maneuver is an exercise that relieves symptoms of vertigo. Vertigo is the feeling that you or your surroundings are moving when they are not. When you feel vertigo, you may feel like the room is spinning and have trouble walking. Dizziness is a little different than vertigo. When you are dizzy, you may feel unsteady or light-headed.  You can do this maneuver at home whenever you have symptoms of vertigo. You can do it up to 3 times a day until your symptoms go away.  Even though the Epley maneuver may relieve your vertigo for a few weeks, it is possible that your symptoms will return. This maneuver relieves vertigo, but it does not relieve dizziness.  What are the risks?  If it is done correctly, the Epley maneuver is considered safe. Sometimes it can lead to dizziness or nausea that goes away after a short time. If you develop other symptoms, such as changes in vision, weakness, or numbness, stop doing the maneuver and call your health care provider.  How to perform the Epley maneuver  1. Sit on the edge of a bed or table with your back straight and your legs extended or hanging over the edge of the bed or table.  2. Turn your head halfway toward the affected ear or side.  3. Lie backward quickly with your head turned until you are lying flat on your back. You may want to position a pillow under your shoulders.  4. Hold this position for 30 seconds. You may experience an attack of vertigo. This is normal.  5. Turn your head to the opposite direction until your unaffected ear is facing the floor.  6. Hold this position for 30 seconds. You may experience an attack of vertigo. This is normal. Hold this position until the vertigo stops.  7. Turn your whole body to the same side as your head. Hold for another 30 seconds.  8. Sit back up.  You can repeat this exercise up to 3 times a day.  Follow these instructions at home:   After doing the Epley maneuver, you can return to  your normal activities.   Ask your health care provider if there is anything you should do at home to prevent vertigo. He or she may recommend that you:  ? Keep your head raised (elevated) with two or more pillows while you sleep.  ? Do not sleep on the side of your affected ear.  ? Get up slowly from bed.  ? Avoid sudden movements during the day.  ? Avoid extreme head movement, like looking up or bending over.  Contact a health care provider if:   Your vertigo gets worse.   You have other symptoms, including:  ? Nausea.  ? Vomiting.  ? Headache.  Get help right away if:   You have vision changes.   You have a severe or worsening headache or neck pain.   You cannot stop vomiting.   You have new numbness or weakness in any part of your body.  Summary   Vertigo is the feeling that you or your surroundings are moving when they are not.   The Epley maneuver is an exercise that relieves symptoms of vertigo.   If the Epley maneuver is done correctly, it is considered safe. You can do it up to 3 times a day.  This information is not intended to replace advice given to you by your health care provider. Make sure   you discuss any questions you have with your health care provider.  Document Released: 09/03/2013 Document Revised: 07/19/2016 Document Reviewed: 07/19/2016  Elsevier Interactive Patient Education  2019 [employer]vier Inc.  \

## 2018-09-28 NOTE — Progress Notes (Signed)
Patient ID: Nicole Blackburn, female    DOB: 06-07-1961  Age: 58 y.o. MRN: [id number]    Subjective:  Subjective  HPI Nicole Blackburn presents for 4 week hx dizziness.  Her bp has been running low.  No cp, no sob, no palpitations.  She did have some sinus pressure / headache.      Review of Systems  Constitutional: Negative for appetite change, diaphoresis, fatigue and unexpected weight change.  Eyes: Negative for pain, redness and visual disturbance.  Respiratory: Negative for cough, chest tightness, shortness of breath and wheezing.   Cardiovascular: Negative for chest pain, palpitations and leg swelling.  Endocrine: Negative for cold intolerance, heat intolerance, polydipsia, polyphagia and polyuria.  Genitourinary: Negative for difficulty urinating, dysuria and frequency.  Musculoskeletal: Positive for arthralgias and gait problem.  Neurological: Positive for dizziness. Negative for light-headedness, numbness and headaches.    History Past Medical History:  Diagnosis Date  . Abnormal pap 1997/1998   cryo  . Anemia   . Hypertension     She has a past surgical history that includes Refractive surgery (Bilateral, 2012) and Wisdom tooth extraction (age 58's).   Her family history includes COPD in her mother; Cancer in her paternal grandfather; Cancer (age of onset: 27) in her paternal grandmother; Colon polyps in her father; Diabetes in her father; Hypertension in her brother, father, and sister; Osteoporosis in her mother; Pulmonary embolism in her mother; Stroke (age of onset: 61) in her maternal grandmother.She reports that she has never smoked. She has never used smokeless tobacco. She reports current alcohol use of about 3.0 standard drinks of alcohol per week. She reports that she does not use drugs.  Current Outpatient Medications on File Prior to Visit  Medication Sig Dispense Refill  . Calcium Carbonate-Vit D-Min (CALCIUM 1200 PO) Take 1 tablet by mouth daily.     . fenofibrate 160  MG tablet TAKE 1 TABLET BY MOUTH EVERY DAY 90 tablet 1  . Multiple Vitamins-Minerals (MULTIVITAMIN PO) Take 1 tablet by mouth daily.     . psyllium (METAMUCIL) 0.52 G capsule Take 0.52 g by mouth daily.     No current facility-administered medications on file prior to visit.      Objective:  Objective  Physical Exam Vitals signs and nursing note reviewed.  Constitutional:      Appearance: She is well-developed.  HENT:     Right Ear: External ear normal.     Left Ear: External ear normal.  Eyes:     General:        Right eye: No discharge.        Left eye: No discharge.     Conjunctiva/sclera: Conjunctivae normal.  Cardiovascular:     Rate and Rhythm: Normal rate and regular rhythm.     Heart sounds: Normal heart sounds. No murmur.  Pulmonary:     Effort: Pulmonary effort is normal. No respiratory distress.     Breath sounds: Normal breath sounds. No wheezing or rales.  Chest:     Chest wall: No tenderness.  Lymphadenopathy:     Cervical: No cervical adenopathy.  Neurological:     Mental Status: She is alert and oriented to person, place, and time.    BP 114/60 (BP Location: Right Arm, Patient Position: Sitting, Cuff Size: Normal)   Pulse 68   Temp 98 F (36.7 C) (Oral)   Ht 4\' 11"  (1.499 m)   Wt 105 lb 9.6 oz (47.9 kg)   LMP 02/24/2015   SpO2 97%  BMI 21.33 kg/m  Wt Readings from Last 3 Encounters:  09/28/18 105 lb 9.6 oz (47.9 kg)  03/23/18 109 lb (49.4 kg)  03/02/18 109 lb 3.2 oz (49.5 kg)     Lab Results  Component Value Date   WBC 4.8 09/28/2018   HGB 12.8 09/28/2018   HCT 38.6 09/28/2018   PLT 278 09/28/2018   GLUCOSE 79 09/28/2018   CHOL 205 (H) 05/29/2018   TRIG 109.0 05/29/2018   HDL 89.00 05/29/2018   LDLDIRECT 123.0 03/22/2016   LDLCALC 94 05/29/2018   ALT 17 09/28/2018   AST 25 09/28/2018   NA 140 09/28/2018   K 5.1 09/28/2018   CL 103 09/28/2018   CREATININE 0.92 09/28/2018   BUN 24 09/28/2018   CO2 26 09/28/2018   TSH 2.22  09/28/2018    Dg Elbow Complete Right  Result Date: 08/23/2017 CLINICAL DATA:  Right elbow pain, fall EXAM: RIGHT ELBOW - COMPLETE 3+ VIEW COMPARISON:  None. FINDINGS: There is a large elbow joint effusion. Subtle cortical irregularity noted in the radial head concerning for radial head fracture. No subluxation or dislocation. IMPRESSION: Findings concerning for nondisplaced radial head fracture. Large joint effusion. Electronically Signed   By: Charlett NoseKevin  Dover M.D.   On: 08/23/2017 10:31     Assessment & Plan:  Plan  I have discontinued Misty StanleyLisa Humes's cyclobenzaprine and lisinopril. I am also having her start on meloxicam. Additionally, I am having her maintain her Multiple Vitamins-Minerals (MULTIVITAMIN PO), Calcium Carbonate-Vit D-Min (CALCIUM 1200 PO), psyllium, and fenofibrate.  Meds ordered this encounter  Medications  . meloxicam (MOBIC) 15 MG tablet    Sig: 1/2-1 po qd prn    Dispense:  30 tablet    Refill:  1    Problem List Items Addressed This Visit      Unprioritized   Allergies    Antihistamine daily flonase  rto prn       Dizzy - Primary    Allergies could be part of cause Check labs  epley manuver       Relevant Orders   CBC with Differential/Platelet (Completed)   Comprehensive metabolic panel (Completed)   TSH (Completed)   Vitamin B12 (Completed)   History of scoliosis    Check xray  Consider ortho      Relevant Medications   meloxicam (MOBIC) 15 MG tablet   Other Relevant Orders   DG Lumbar Spine Complete (Completed)   DG HIP UNILAT WITH PELVIS 2-3 VIEWS RIGHT (Completed)   Right hip pain    Xray  mobic prn Consider ortho      Relevant Medications   meloxicam (MOBIC) 15 MG tablet   Other Relevant Orders   DG Lumbar Spine Complete (Completed)   DG HIP UNILAT WITH PELVIS 2-3 VIEWS RIGHT (Completed)      Follow-up: Return if symptoms worsen or fail to improve.  Donato SchultzYvonne R Lowne Chase, DO

## 2018-09-29 LAB — CBC WITH DIFFERENTIAL/PLATELET
Absolute Monocytes: 384 cells/uL (ref 200–950)
BASOS ABS: 29 {cells}/uL (ref 0–200)
BASOS PCT: 0.6 %
EOS ABS: 19 {cells}/uL (ref 15–500)
EOS PCT: 0.4 %
HEMATOCRIT: 38.6 % (ref 35.0–45.0)
HEMOGLOBIN: 12.8 g/dL (ref 11.7–15.5)
LYMPHS ABS: 1930 {cells}/uL (ref 850–3900)
MCH: 31.4 pg (ref 27.0–33.0)
MCHC: 33.2 g/dL (ref 32.0–36.0)
MCV: 94.6 fL (ref 80.0–100.0)
MPV: 10.4 fL (ref 7.5–12.5)
Monocytes Relative: 8 %
NEUTROS ABS: 2438 {cells}/uL (ref 1500–7800)
Neutrophils Relative %: 50.8 %
Platelets: 278 10*3/uL (ref 140–400)
RBC: 4.08 10*6/uL (ref 3.80–5.10)
RDW: 11.9 % (ref 11.0–15.0)
Total Lymphocyte: 40.2 %
WBC: 4.8 10*3/uL (ref 3.8–10.8)

## 2018-09-29 LAB — COMPREHENSIVE METABOLIC PANEL
AG RATIO: 2 (calc) (ref 1.0–2.5)
ALT: 17 U/L (ref 6–29)
AST: 25 U/L (ref 10–35)
Albumin: 4.9 g/dL (ref 3.6–5.1)
Alkaline phosphatase (APISO): 61 U/L (ref 33–130)
BILIRUBIN TOTAL: 0.9 mg/dL (ref 0.2–1.2)
BUN: 24 mg/dL (ref 7–25)
CALCIUM: 10.5 mg/dL — AB (ref 8.6–10.4)
CHLORIDE: 103 mmol/L (ref 98–110)
CO2: 26 mmol/L (ref 20–32)
Creat: 0.92 mg/dL (ref 0.50–1.05)
GLOBULIN: 2.4 g/dL (ref 1.9–3.7)
Glucose, Bld: 79 mg/dL (ref 65–99)
POTASSIUM: 5.1 mmol/L (ref 3.5–5.3)
Sodium: 140 mmol/L (ref 135–146)
Total Protein: 7.3 g/dL (ref 6.1–8.1)

## 2018-09-29 LAB — VITAMIN B12: Vitamin B-12: 755 pg/mL (ref 200–1100)

## 2018-09-29 LAB — TSH: TSH: 2.22 mIU/L (ref 0.40–4.50)

## 2018-09-30 DIAGNOSIS — R42 Dizziness and giddiness: Secondary | ICD-10-CM | POA: Insufficient documentation

## 2018-09-30 DIAGNOSIS — T7840XA Allergy, unspecified, initial encounter: Secondary | ICD-10-CM | POA: Insufficient documentation

## 2018-09-30 DIAGNOSIS — M25551 Pain in right hip: Secondary | ICD-10-CM | POA: Insufficient documentation

## 2018-09-30 DIAGNOSIS — Z8739 Personal history of other diseases of the musculoskeletal system and connective tissue: Secondary | ICD-10-CM | POA: Insufficient documentation

## 2018-09-30 NOTE — Assessment & Plan Note (Signed)
Check xray Consider ortho 

## 2018-09-30 NOTE — Assessment & Plan Note (Signed)
Antihistamine daily flonase  rto prn

## 2018-09-30 NOTE — Assessment & Plan Note (Signed)
Xray  mobic prn Consider ortho

## 2018-09-30 NOTE — Assessment & Plan Note (Signed)
Allergies could be part of cause Check labs  [facility]

## 2018-11-13 NOTE — Progress Notes (Signed)
58 y.o. G52P0011 Married White or Caucasian Not Hispanic or Latino female here for annual exam.   No vaginal bleeding. Still having hot flashes, tolerable. Sexually active, no pain.   She has been having pain in her right hip for the last month, hurts when she gets up and moves. Negative X-Ray, NSAID's are helping. She is c/o pressure in her head and dizziness at night with her primary. Her primary just stopped her lisinopril a few weeks ago, not helping.     Patient's last menstrual period was 02/24/2015.          Sexually active: Yes.    The current method of family planning is post menopausal status.    Exercising: Yes.    elliptical Smoker:  no  Health Maintenance: Pap:  03/22/2017 WNL NEG HPV, 02-24-15 WNL NEG HR HPV  History of abnormal Pap:  Yes- years ago MMG:  05/25/2018 Birads 1 negative Colonoscopy:  02-29-12 WNL, she is due to f/u this year.  BMD:   Never TDaP:  2010 Gardasil: N/A   reports that she has never smoked. She has never used smokeless tobacco. She reports current alcohol use of about 3.0 standard drinks of alcohol per week. She reports that she does not use drugs. 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 She has a grown daughter, lives locally.  Past Medical History:  Diagnosis Date  . Abnormal pap 1997/1998   cryo  . Anemia   . Hypertension     Past Surgical History:  Procedure Laterality Date  . REFRACTIVE SURGERY Bilateral 2012   Dr Nile Riggs  . WISDOM TOOTH EXTRACTION  age 83's    Current Outpatient Medications  Medication Sig Dispense Refill  . Calcium Carbonate-Vit D-Min (CALCIUM 1200 PO) Take 1 tablet by mouth daily.     . fenofibrate 160 MG tablet TAKE 1 TABLET BY MOUTH EVERY DAY 90 tablet 1  . Krill Oil 300 MG CAPS     . meloxicam (MOBIC) 15 MG tablet 1/2-1 po qd prn 30 tablet 1  . Multiple Vitamins-Minerals (MULTIVITAMIN PO) Take 1 tablet by mouth daily.     . psyllium (METAMUCIL) 0.52 G capsule Take 0.52 g by mouth daily.     No  current facility-administered medications for this visit.     Family History  Problem Relation Age of Onset  . COPD Mother   . Osteoporosis Mother   . Pulmonary embolism Mother   . Diabetes Father   . Hypertension Father   . Colon polyps Father   . Hypertension Sister   . Hypertension Brother   . Stroke Maternal Grandmother 60  . Cancer Paternal Grandmother 84       multiple myloma  . Cancer Paternal Grandfather        ? kind cancer    Review of Systems  Constitutional: Negative.   HENT: Negative.   Eyes: Negative.   Respiratory: Negative.   Cardiovascular: Negative.   Gastrointestinal: Negative.   Endocrine: Negative.   Genitourinary: Negative.   Musculoskeletal: Negative.   Skin: Negative.   Allergic/Immunologic: Negative.   Neurological: Negative.   Hematological: Negative.   Psychiatric/Behavioral: Negative.     Exam:   BP 132/70 (BP Location: Right Arm, Patient Position: Sitting, Cuff Size: Normal)   Pulse 80   Resp 16   Ht 4' 10.5" (1.486 m)   Wt 108 lb (49 kg)   LMP 02/24/2015   BMI 22.19 kg/m   Weight change: @WEIGHTCHANGE @ Height:   Height:  4' 10.5" (148.6 cm)  Ht Readings from Last 3 Encounters:  11/16/18 4' 10.5" (1.486 m)  09/28/18 4\' 11"  (1.499 m)  03/23/18 4\' 11"  (1.499 m)    General appearance: alert, cooperative and appears stated age Head: Normocephalic, without obvious abnormality, atraumatic Neck: no adenopathy, supple, symmetrical, trachea midline and thyroid normal to inspection and palpation Lungs: clear to auscultation bilaterally Cardiovascular: regular rate and rhythm Breasts: normal appearance, no masses or tenderness Abdomen: soft, non-tender; non distended,  no masses,  no organomegaly Extremities: extremities normal, atraumatic, no cyanosis or edema Skin: Skin color, texture, turgor normal. No rashes or lesions Lymph nodes: Cervical, supraclavicular, and axillary nodes normal. No abnormal inguinal nodes palpated Neurologic:  Grossly normal   Pelvic: External genitalia:  no lesions              Urethra:  normal appearing urethra with no masses, tenderness or lesions              Bartholins and Skenes: normal                 Vagina: atrophic appearing vagina with normal color and discharge, no lesions              Cervix: no lesions               Bimanual Exam:  Uterus:  normal size, contour, position, consistency, mobility, non-tender              Adnexa: no mass, fullness, tenderness               Rectovaginal: Confirms               Anus:  normal sphincter tone, no lesions  Chaperone was present for exam.  A:  Well Woman with normal exam  P:   Pap next year  Mammogram UTD  Colonoscopy due later this year  TDAP today  Labs with primary  Discussed breast self exam  Discussed calcium and vit D intake

## 2018-11-16 ENCOUNTER — Ambulatory Visit: Payer: Managed Care, Other (non HMO) | Admitting: Obstetrics and Gynecology

## 2018-11-16 ENCOUNTER — Encounter: Payer: Self-pay | Admitting: Obstetrics and Gynecology

## 2018-11-16 ENCOUNTER — Other Ambulatory Visit: Payer: Self-pay

## 2018-11-16 VITALS — BP 132/70 | HR 80 | Resp 16 | Ht 58.5 in | Wt 108.0 lb

## 2018-11-16 DIAGNOSIS — Z23 Encounter for immunization: Secondary | ICD-10-CM

## 2018-11-16 DIAGNOSIS — Z01419 Encounter for gynecological examination (general) (routine) without abnormal findings: Secondary | ICD-10-CM | POA: Diagnosis not present

## 2018-11-16 NOTE — Patient Instructions (Signed)

## 2019-01-29 ENCOUNTER — Telehealth: Payer: Managed Care, Other (non HMO) | Admitting: Physician Assistant

## 2019-01-29 DIAGNOSIS — L282 Other prurigo: Secondary | ICD-10-CM | POA: Diagnosis not present

## 2019-01-29 MED ORDER — TRIAMCINOLONE ACETONIDE 0.1 % EX CREA: 1.0000 "application " | TOPICAL_CREAM | Freq: Two times a day (BID) | CUTANEOUS | 0 refills | Status: DC

## 2019-01-29 NOTE — Progress Notes (Signed)
E Visit for Rash  We are sorry that you are not feeling well. Here is how we plan to help!  Based on what you shared with me it looks like you have contact dermatitis.  Contact dermatitis is a skin rash caused by something that touches the skin and causes irritation or inflammation.  Your skin may be red, swollen, dry, cracked, and itch.  The rash should go away in a few days but can last a few weeks.  If you get a rash, it's important to figure out what caused it so the irritant can be avoided in the future. I am prescribing a topical steroid to help the inflamed skin. Apply it twice a day. If you do not see any improvement then please call your primary care provider.  In the context of pain a rheumatological disease, such as psoriatic arthritis, ankylosing spondylitis, lupus or rheumatoid arthritis could be entertained. There are simple labs that can help lead a clinician in that direction, such as a sed rate and CRP.   HOME CARE:  Take cool showers and avoid direct sunlight. Apply cool compress or wet dressings. Take a bath in an oatmeal bath.  Sprinkle content of one Aveeno packet under running faucet with comfortably warm water.  Bathe for 15-20 minutes, 1-2 times daily.  Pat dry with a towel. Do not rub the rash. Use hydrocortisone cream. Take an antihistamine like Benadryl for widespread rashes that itch.  The adult dose of Benadryl is 25-50 mg by mouth 4 times daily. Caution:  This type of medication may cause sleepiness.  Do not drink alcohol, drive, or operate dangerous machinery while taking antihistamines.  Do not take these medications if you have prostate enlargement.  Read package instructions thoroughly on all medications that you take.  GET HELP RIGHT AWAY IF:  Symptoms don't go away after treatment. Severe itching that persists. If you rash spreads or swells. If you rash begins to smell. If it blisters and opens or develops a yellow-brown crust. You develop a fever. You have  a sore throat. You become short of breath.  MAKE SURE YOU:  Understand these instructions. Will watch your condition. Will get help right away if you are not doing well or get worse.  Thank you for choosing an e-visit. Your e-visit answers were reviewed by a board certified advanced clinical practitioner to complete your personal care plan. Depending upon the condition, your plan could have included both over the counter or prescription medications. Please review your pharmacy choice. Be sure that the pharmacy you have chosen is open so that you can pick up your prescription now.  If there is a problem you may message your provider in MyChart to have the prescription routed to another pharmacy. Your safety is important to [place]. If you have drug allergies check your prescription carefully.  For the next 24 hours, you can use MyChart to ask questions about today's visit, request a non-urgent call back, or ask for a work or school excuse from your e-visit provider. You will get an email in the next two days asking about your experience. I hope that your e-visit has been valuable and will speed your recovery.      ===View-only below this line===   ----- Message -----    From: [name]    Sent: 01/29/2019  2:46 PM EDT      To: E-Visit Mailing List Subject: E-Visit Submission: Rash  E-Visit Submission: Rash --------------------------------  Question: Where is your rash  located? (check all that apply) Answer:   Chest  Question: Is the rash is an area that tends to be moist or sweaty? Answer:   No  Question: Rash location "other"/comments: Answer:   I have been having pain in my neck, head and shoulders for a couple of weeks now.  I just thought it may be related to my office chair.  However last night I developed a rash on my neck and on my torso.  It is not really itchy, just really red.  I have attached a photo.  Question: Are you able to attach a photo? Please do if  possible Answer:   Yes  Question: Do you have a fever? Answer:   No  Question: Does your rash itch? Answer:   No  Question: Did the rash appear after shaving or other skin irritation? Answer:   No  Question: Is the rash painful? Answer:   No  Question: When did your symptoms start? Answer:   2 weeks ago  Question: Did your rash appear after sun exposure? Answer:   No  Question: Have you had a tick bite in the last 2 weeks? Answer:   No  Question: Did your rash appear shortly after eating a new food? Answer:   No  Question: Other new products used/comments: Answer:   I have gotten this same rash a few times before.  After I received the shingles vaccine I got it and then I got it once when I had pink eye and then again when I had Epstein-barr virus.  It is not really painful, just looks weird and not sure what is causing it.  Question: Have you tried any of the following over-the-counter medications? Answer:   No  Question: Over the counter med "other"/comments: Answer:     Question: Did any of the medications help? Answer:   No  Question: Are you experiencing any shortness of breath? Answer:   No  Question: Please list your medication allergies that you may have ? (If 'none' , please list as 'none') Answer:   None  Question: Please list any additional comments  Answer:   The rash is not really bothering me as much as the pain in my neck, shoulders and head.  It feels like muscle pain, but I have tried Tylenol, Meloxicam and a muscle relaxer and none of these seem to help.  A total of 5-10 minutes was spent evaluating this patients questionnaire and formulating a plan of care.  a

## 2019-02-18 ENCOUNTER — Other Ambulatory Visit: Payer: Self-pay

## 2019-02-18 ENCOUNTER — Encounter: Payer: Self-pay | Admitting: Family Medicine

## 2019-02-18 ENCOUNTER — Ambulatory Visit (INDEPENDENT_AMBULATORY_CARE_PROVIDER_SITE_OTHER): Payer: Managed Care, Other (non HMO) | Admitting: Family Medicine

## 2019-02-18 VITALS — BP 112/85 | HR 111 | Wt 106.0 lb

## 2019-02-18 DIAGNOSIS — M25551 Pain in right hip: Secondary | ICD-10-CM | POA: Diagnosis not present

## 2019-02-18 DIAGNOSIS — M25511 Pain in right shoulder: Secondary | ICD-10-CM

## 2019-02-18 DIAGNOSIS — Z8739 Personal history of other diseases of the musculoskeletal system and connective tissue: Secondary | ICD-10-CM

## 2019-02-18 MED ORDER — MELOXICAM 15 MG PO TABS: ORAL_TABLET | ORAL | 1 refills | Status: DC

## 2019-02-18 MED ORDER — METHOCARBAMOL 500 MG PO TABS: 500.0000 mg | ORAL_TABLET | Freq: Four times a day (QID) | ORAL | 1 refills | Status: DC

## 2019-02-18 NOTE — Progress Notes (Signed)
Virtual Visit via Video Note  I connected with Nicole Blackburn on 02/18/19 at  1:30 PM EDT by a video enabled telemedicine application and verified that I am speaking with the correct person using two identifiers.  Location: Patient: home  Provider: home    I discussed the limitations of evaluation and management by telemedicine and the availability of in person appointments. The patient expressed understanding and agreed to proceed.  History of Present Illness: Pt is home c/o R shoulder pain and muscle spasm--- no known injury--mobic helps some.  Muscle relaxer helps too but it is too sedating   No chest pain , no sob, no palp.  No fevers    Past Medical History:  Diagnosis Date  . Abnormal pap 1997/1998   cryo  . Anemia   . Hypertension    Current Outpatient Medications on File Prior to Visit  Medication Sig Dispense Refill  . Calcium Carbonate-Vit D-Min (CALCIUM 1200 PO) Take 1 tablet by mouth daily.     . fenofibrate 160 MG tablet TAKE 1 TABLET BY MOUTH EVERY DAY 90 tablet 1  . Multiple Vitamins-Minerals (MULTIVITAMIN PO) Take 1 tablet by mouth daily.     . psyllium (METAMUCIL) 0.52 G capsule Take 0.52 g by mouth daily.     No current facility-administered medications on file prior to visit.     Observations/Objective: No vitals obtained  Pt in NAD   Assessment and Plan: 1. Acute pain of right shoulder Muscle relaxer , mobic Warm compresses  rto Friday if needed  - methocarbamol (ROBAXIN) 500 MG tablet; Take 1 tablet (500 mg total) by mouth 4 (four) times daily.  Dispense: 30 tablet; Refill: 1  2. History of scoliosis   - meloxicam (MOBIC) 15 MG tablet; 1/2-1 po qd prn  Dispense: 30 tablet; Refill: 1  3. Right hip pain   - meloxicam (MOBIC) 15 MG tablet; 1/2-1 po qd prn  Dispense: 30 tablet; Refill: 1   Follow Up Instructions:    I discussed the assessment and treatment plan with the patient. The patient was provided an opportunity to ask questions and all were  answered. The patient agreed with the plan and demonstrated an understanding of the instructions.   The patient was advised to call back or seek an in-person evaluation if the symptoms worsen or if the condition fails to improve as anticipated.  I provided 25 minutes of non-face-to-face time during this encounter.   Ann Held, DO

## 2019-02-23 ENCOUNTER — Other Ambulatory Visit: Payer: Self-pay | Admitting: Family Medicine

## 2019-03-05 ENCOUNTER — Other Ambulatory Visit: Payer: Self-pay

## 2019-03-05 ENCOUNTER — Ambulatory Visit (HOSPITAL_BASED_OUTPATIENT_CLINIC_OR_DEPARTMENT_OTHER)
Admission: RE | Admit: 2019-03-05 | Discharge: 2019-03-05 | Disposition: A | Discharge status: Home / Self Care | Payer: Managed Care, Other (non HMO) | Source: Ambulatory Visit | Attending: Family Medicine | Admitting: Family Medicine

## 2019-03-05 ENCOUNTER — Encounter: Payer: Self-pay | Admitting: Family Medicine

## 2019-03-05 ENCOUNTER — Ambulatory Visit: Payer: Managed Care, Other (non HMO) | Admitting: Family Medicine

## 2019-03-05 VITALS — BP 116/80 | HR 74 | Temp 98.8°F | Resp 16 | Ht 58.5 in | Wt 106.0 lb

## 2019-03-05 DIAGNOSIS — R071 Chest pain on breathing: Secondary | ICD-10-CM | POA: Diagnosis not present

## 2019-03-05 DIAGNOSIS — M791 Myalgia, unspecified site: Secondary | ICD-10-CM

## 2019-03-05 NOTE — Progress Notes (Signed)
Patient ID: Nicole Blackburn, female    DOB: 11/10/1960  Age: 59 y.o. MRN: [id number]    Subjective:  Subjective  HPI  Nicole Blackburn presents for shoulder pain and b/l collar bone pain -- and upper back pain Muscle relaxer and mobic did nothing   Review of Systems  Constitutional: Negative for appetite change, diaphoresis, fatigue and unexpected weight change.  HENT: Negative.   Eyes: Negative for pain, redness and visual disturbance.  Respiratory: Positive for cough. Negative for chest tightness, shortness of breath and wheezing.   Cardiovascular: Positive for chest pain. Negative for palpitations and leg swelling.  Endocrine: Negative for cold intolerance, heat intolerance, polydipsia, polyphagia and polyuria.  Genitourinary: Negative for difficulty urinating, dysuria and frequency.  Musculoskeletal: Positive for arthralgias and myalgias.  Neurological: Negative for dizziness, light-headedness, numbness and headaches.    History Past Medical History:  Diagnosis Date  . Abnormal pap 1997/1998   cryo  . Anemia   . Hypertension     She has a past surgical history that includes Refractive surgery (Bilateral, 2012) and Wisdom tooth extraction (age 27's).   Her family history includes COPD in her mother; Cancer in her paternal grandfather; Cancer (age of onset: 51) in her paternal grandmother; Colon polyps in her father; Diabetes in her father; Hypertension in her brother, father, and sister; Osteoporosis in her mother; Pulmonary embolism in her mother; Stroke (age of onset: 52) in her maternal grandmother.She reports that she has never smoked. She has never used smokeless tobacco. She reports current alcohol use of about 3.0 standard drinks of alcohol per week. She reports that she does not use drugs.  Current Outpatient Medications on File Prior to Visit  Medication Sig Dispense Refill  . Calcium Carbonate-Vit D-Min (CALCIUM 1200 PO) Take 1 tablet by mouth daily.     . fenofibrate 160 MG  tablet TAKE 1 TABLET BY MOUTH EVERY DAY 90 tablet 0  . meloxicam (MOBIC) 15 MG tablet 1/2-1 po qd prn 30 tablet 1  . methocarbamol (ROBAXIN) 500 MG tablet Take 1 tablet (500 mg total) by mouth 4 (four) times daily. 30 tablet 1  . Multiple Vitamins-Minerals (MULTIVITAMIN PO) Take 1 tablet by mouth daily.     . psyllium (METAMUCIL) 0.52 G capsule Take 0.52 g by mouth daily.     No current facility-administered medications on file prior to visit.      Objective:  Objective  Physical Exam Vitals signs and nursing note reviewed.  Constitutional:      Appearance: She is well-developed.  HENT:     Head: Normocephalic and atraumatic.  Eyes:     Conjunctiva/sclera: Conjunctivae normal.  Neck:     Musculoskeletal: Normal range of motion and neck supple.     Thyroid: No thyromegaly.     Vascular: No carotid bruit or JVD.  Cardiovascular:     Rate and Rhythm: Normal rate and regular rhythm.     Heart sounds: Normal heart sounds. No murmur.  Pulmonary:     Effort: Pulmonary effort is normal. No respiratory distress.     Breath sounds: Normal breath sounds. No wheezing or rales.  Chest:     Chest wall: No tenderness.  Musculoskeletal: Normal range of motion.        General: Tenderness present. No swelling, deformity or signs of injury.     Right lower leg: No edema.     Left lower leg: No edema.  Neurological:     Mental Status: She is alert and oriented to  person, place, and time.    BP 116/80 (BP Location: Right Arm, Patient Position: Sitting, Cuff Size: Normal)   Pulse 74   Temp 98.8 F (37.1 C) (Oral)   Resp 16   Ht 4' 10.5" (1.486 m)   Wt 106 lb (48.1 kg)   LMP 02/24/2015   SpO2 98%   BMI 21.78 kg/m  Wt Readings from Last 3 Encounters:  03/05/19 106 lb (48.1 kg)  02/18/19 106 lb (48.1 kg)  11/16/18 108 lb (49 kg)     Lab Results  Component Value Date   WBC 4.8 09/28/2018   HGB 12.8 09/28/2018   HCT 38.6 09/28/2018   PLT 278 09/28/2018   GLUCOSE 79 09/28/2018    CHOL 205 (H) 05/29/2018   TRIG 109.0 05/29/2018   HDL 89.00 05/29/2018   LDLDIRECT 123.0 03/22/2016   LDLCALC 94 05/29/2018   ALT 17 09/28/2018   AST 25 09/28/2018   NA 140 09/28/2018   K 5.1 09/28/2018   CL 103 09/28/2018   CREATININE 0.92 09/28/2018   BUN 24 09/28/2018   CO2 26 09/28/2018   TSH 2.22 09/28/2018    Dg Lumbar Spine Complete  Result Date: 09/28/2018 CLINICAL DATA:  Chronic low back pain for 2 weeks without trauma. EXAM: LUMBAR SPINE - COMPLETE 4+ VIEW COMPARISON:  None. FINDINGS: Moderate convex right lumbar spine curvature, centered about the L2-3 level. Sacroiliac joints are symmetric. Lateral views somewhat degraded by the extent of spinal curvature. Given this limitation, maintenance of vertebral body height. Mildly age advanced lumbar spondylosis with facet arthropathy and loss of intervertebral disc height. IMPRESSION: Spinal curvature and age advanced spondylosis. No acute osseous abnormality. Electronically Signed   By: Jeronimo GreavesKyle  Talbot M.D.   On: 09/28/2018 21:42   Dg Hip Unilat With Pelvis 2-3 Views Right  Result Date: 09/28/2018 CLINICAL DATA:  Right hip pain for 2 weeks without trauma. EXAM: DG HIP (WITH OR WITHOUT PELVIS) 2-3V RIGHT COMPARISON:  Lumbar spine radiographs same date. FINDINGS: AP view the pelvis and AP/frog leg views of the right hip. Lower lumbar spondylosis was detailed on prior lumbar radiographs. Sacroiliac joints are symmetric. Femoral heads are located. Joint spaces maintained. No acute fracture. IMPRESSION: No acute osseous abnormality. Electronically Signed   By: Jeronimo GreavesKyle  Talbot M.D.   On: 09/28/2018 21:43   ekg-- NSR    Assessment & Plan:  Plan  I am having Nicole PartridgeLisa Blackburn maintain her Multiple Vitamins-Minerals (MULTIVITAMIN PO), Calcium Carbonate-Vit D-Min (CALCIUM 1200 PO), psyllium, methocarbamol, meloxicam, and fenofibrate.  No orders of the defined types were placed in this encounter.   Problem List Items Addressed This Visit    None     Visit Diagnoses    Myalgia    -  Primary   Relevant Orders   Vitamin B12   Vitamin D (25 hydroxy)   CBC with Differential/Platelet   Comprehensive metabolic panel   Antinuclear Antib (ANA)   Rheumatoid Factor   CK (Creatine Kinase)   Chest pain on breathing       Relevant Orders   EKG 12-Lead (Completed)   DG Chest 2 View (Completed)      Follow-up: Return if symptoms worsen or fail to improve.  Donato SchultzYvonne R Lowne Chase, DO

## 2019-03-05 NOTE — Patient Instructions (Signed)

## 2019-03-06 LAB — COMPREHENSIVE METABOLIC PANEL
ALT: 16 U/L (ref 0–35)
AST: 24 U/L (ref 0–37)
Albumin: 4.6 g/dL (ref 3.5–5.2)
Alkaline Phosphatase: 57 U/L (ref 39–117)
BUN: 21 mg/dL (ref 6–23)
CO2: 27 mEq/L (ref 19–32)
Calcium: 9.6 mg/dL (ref 8.4–10.5)
Chloride: 104 mEq/L (ref 96–112)
Creatinine, Ser: 0.87 mg/dL (ref 0.40–1.20)
GFR: 66.92 mL/min (ref >60.00)
Glucose, Bld: 80 mg/dL (ref 70–99)
Potassium: 4.2 mEq/L (ref 3.5–5.1)
Sodium: 140 mEq/L (ref 135–145)
Total Bilirubin: 0.7 mg/dL (ref 0.2–1.2)
Total Protein: 7.1 g/dL (ref 6.0–8.3)

## 2019-03-06 LAB — CBC WITH DIFFERENTIAL/PLATELET
Basophils Absolute: 0 10*3/uL (ref 0.0–0.1)
Basophils Relative: 0.4 % (ref 0.0–3.0)
Eosinophils Absolute: 0.1 10*3/uL (ref 0.0–0.7)
Eosinophils Relative: 1.1 % (ref 0.0–5.0)
HCT: 38.7 % (ref 36.0–46.0)
Hemoglobin: 12.7 g/dL (ref 12.0–15.0)
Lymphocytes Relative: 30.2 % (ref 12.0–46.0)
Lymphs Abs: 1.7 10*3/uL (ref 0.7–4.0)
MCHC: 32.9 g/dL (ref 30.0–36.0)
MCV: 95.3 fl (ref 78.0–100.0)
Monocytes Absolute: 0.4 10*3/uL (ref 0.1–1.0)
Monocytes Relative: 6.8 % (ref 3.0–12.0)
Neutro Abs: 3.4 10*3/uL (ref 1.4–7.7)
Neutrophils Relative %: 61.5 % (ref 43.0–77.0)
Platelets: 261 10*3/uL (ref 150.0–400.0)
RBC: 4.06 Mil/uL (ref 3.87–5.11)
RDW: 12.4 % (ref 11.5–15.5)
WBC: 5.5 10*3/uL (ref 4.0–10.5)

## 2019-03-06 LAB — VITAMIN B12: Vitamin B-12: 504 pg/mL (ref 211–911)

## 2019-03-06 LAB — VITAMIN D 25 HYDROXY (VIT D DEFICIENCY, FRACTURES): VITD: 67.03 ng/mL (ref 30.00–100.00)

## 2019-03-06 LAB — CK: Total CK: 44 U/L (ref 7–177)

## 2019-03-07 LAB — ANA: Anti Nuclear Antibody (ANA): NEGATIVE

## 2019-03-07 LAB — RHEUMATOID FACTOR: Rhuematoid fact SerPl-aCnc: <14 IU/mL (ref <14)

## 2019-03-11 ENCOUNTER — Encounter: Payer: Self-pay | Admitting: Family Medicine

## 2019-03-11 ENCOUNTER — Other Ambulatory Visit: Payer: Self-pay | Admitting: Family Medicine

## 2019-03-11 DIAGNOSIS — M4124 Other idiopathic scoliosis, thoracic region: Secondary | ICD-10-CM

## 2019-03-20 ENCOUNTER — Ambulatory Visit (INDEPENDENT_AMBULATORY_CARE_PROVIDER_SITE_OTHER): Payer: Managed Care, Other (non HMO)

## 2019-03-20 ENCOUNTER — Other Ambulatory Visit: Payer: Self-pay

## 2019-03-20 ENCOUNTER — Ambulatory Visit (INDEPENDENT_AMBULATORY_CARE_PROVIDER_SITE_OTHER): Payer: Managed Care, Other (non HMO) | Admitting: Orthopaedic Surgery

## 2019-03-20 ENCOUNTER — Encounter: Payer: Self-pay | Admitting: Orthopaedic Surgery

## 2019-03-20 VITALS — Ht 59.0 in | Wt 106.0 lb

## 2019-03-20 DIAGNOSIS — M542 Cervicalgia: Secondary | ICD-10-CM

## 2019-03-20 DIAGNOSIS — M4722 Other spondylosis with radiculopathy, cervical region: Secondary | ICD-10-CM | POA: Diagnosis not present

## 2019-03-20 MED ORDER — PREDNISONE 10 MG PO TABS: ORAL_TABLET | ORAL | 0 refills | Status: DC

## 2019-03-20 NOTE — Progress Notes (Signed)
Office Visit Note   Patient: Nicole Blackburn           Date of Birth: [birth year]           MRN: [id number] Visit Date: 03/20/2019              Requested by: [address] [place], [place] [address] [place],  [place] [address] PCP: Carollee Herter, Alferd Apa, DO   Assessment & Plan: Visit Diagnoses:  1. Neck pain   2. Other spondylosis with radiculopathy, cervical region     Plan: Patient prescribed some prednisone 20 mg daily x2 days then 10 mg daily for 1 week.  I will recheck her again in 3 to 4 weeks.  We discussed possible physical therapy referral versus diagnostic MRI imaging with her persistent cervical spondylosis that is been symptomatic since the beginning of May.  She has had associated headaches.  She has been sleeping most nights fairly well.  We will see how she does with the prednisone and recheck her in 3 weeks.  Follow-Up Instructions: Return in about 3 weeks (around 04/10/2019).   Orders:  Orders Placed This Encounter  Procedures  . XR Cervical Spine 2 or 3 views   Meds ordered this encounter  Medications  . predniSONE (DELTASONE) 10 MG tablet    Sig: Take 2 daily with meals times 2 days then one by mouth daily until completed.    Dispense:  12 tablet    Refill:  0      Procedures: No procedures performed   Clinical Data: No additional findings.   Subjective: Chief Complaint  Patient presents with  . Middle Back - Pain  . Neck - Pain  . Chest - Pain    HPI 58 year old female with onset neck pain in early May.  She had been treated by Dr. Roma Schanz with anti-inflammatories, muscle relaxants meloxicam.  She has had persistent pain in her neck that radiates into her shoulder slightly more on the right than left primarily over the clavicles sometimes in the proximal arm but stops at the elbow.  She notes discomfort with turning her neck.  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  She denies any myelopathic gait problems.  No  past history of injury to her neck.  She does have significant lumbar scoliosis.  Review of Systems for lumbar scoliosis, anemia, hypertension, cervical spondylosis.  Negative for stroke CVA angina.   Objective: Vital Signs: Ht 4\' 11"  (1.499 m)   Wt 106 lb (48.1 kg)   LMP 02/24/2015   BMI 21.41 kg/m   Physical Exam Constitutional:      Appearance: She is well-developed.  HENT:     Head: Normocephalic.     Right Ear: External ear normal.     Left Ear: External ear normal.  Eyes:     Pupils: Pupils are equal, round, and reactive to light.  Neck:     Thyroid: No thyromegaly.     Trachea: No tracheal deviation.  Cardiovascular:     Rate and Rhythm: Normal rate.  Pulmonary:     Effort: Pulmonary effort is normal.  Abdominal:     Palpations: Abdomen is soft.  Skin:    General: Skin is warm and dry.  Neurological:     Mental Status: She is alert and oriented to person, place, and time.  Psychiatric:        Behavior: Behavior normal.     Ortho Exam patient  has some increased neck and shoulder pain with cervical compression some improvement with distraction brachial plexus tenderness both right and left.  Negative Spurling.  70% flexion extension with pain negative Lhermitte.  Reflexes are 2+ and symmetrical no atrophy.  Negative shoulder impingement.  No sternal costal or sternal clavicular tenderness.  Specialty Comments:  No specialty comments available.  Imaging: No results found.   PMFS History: Patient Active Problem List   Diagnosis Date Noted  . Other spondylosis with radiculopathy, cervical region 03/20/2019  . Dizzy 09/30/2018  . History of scoliosis 09/30/2018  . Right hip pain 09/30/2018  . Allergies 09/30/2018  . Rash 03/25/2018  . Fever 03/25/2018  . Muscle spasm 03/25/2018  . HTN (hypertension) 07/20/2015  . Chronic cough 12/11/2012  . Idiopathic scoliosis 12/11/2012  . Anemia    Past Medical History:  Diagnosis Date  . Abnormal pap 1997/1998    cryo  . Anemia   . Hypertension     Family History  Problem Relation Age of Onset  . COPD Mother   . Osteoporosis Mother   . Pulmonary embolism Mother   . Diabetes Father   . Hypertension Father   . Colon polyps Father   . Hypertension Sister   . Hypertension Brother   . Stroke Maternal Grandmother 60  . Cancer Paternal Grandmother [id number]       multiple myloma  . Cancer Paternal Grandfather        ? kind cancer    Past Surgical History:  Procedure Laterality Date  . REFRACTIVE SURGERY Bilateral 2012   Dr Nile RiggsShapiro  . WISDOM TOOTH EXTRACTION  age 58's   Social History   Occupational History  . 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 Smoking status: Never Smoker  . Smokeless tobacco: Never Used  Substance and Sexual Activity  . Alcohol use: Yes    Alcohol/week: 3.0 standard drinks    Types: 3 Standard drinks or equivalent per week    Comment: Socially  . Drug use: No  . Sexual activity: Yes    Partners: Male    Birth control/protection: Post-menopausal

## 2019-03-29 ENCOUNTER — Encounter: Payer: Self-pay | Admitting: Orthopaedic Surgery

## 2019-03-29 DIAGNOSIS — M4722 Other spondylosis with radiculopathy, cervical region: Secondary | ICD-10-CM

## 2019-04-17 ENCOUNTER — Encounter: Payer: Self-pay | Admitting: Orthopaedic Surgery

## 2019-04-17 MED ORDER — PREDNISONE 10 MG PO TABS: ORAL_TABLET | ORAL | 0 refills | Status: DC

## 2019-05-03 ENCOUNTER — Ambulatory Visit
Admission: RE | Admit: 2019-05-03 | Discharge: 2019-05-03 | Disposition: A | Discharge status: Home / Self Care | Payer: Managed Care, Other (non HMO) | Source: Ambulatory Visit | Attending: Orthopaedic Surgery | Admitting: Orthopaedic Surgery

## 2019-05-03 ENCOUNTER — Other Ambulatory Visit: Payer: Self-pay

## 2019-05-03 DIAGNOSIS — M4722 Other spondylosis with radiculopathy, cervical region: Secondary | ICD-10-CM

## 2019-05-07 ENCOUNTER — Ambulatory Visit: Payer: Managed Care, Other (non HMO) | Admitting: Orthopaedic Surgery

## 2019-05-14 ENCOUNTER — Ambulatory Visit (INDEPENDENT_AMBULATORY_CARE_PROVIDER_SITE_OTHER): Payer: Managed Care, Other (non HMO) | Admitting: Orthopaedic Surgery

## 2019-05-14 ENCOUNTER — Encounter: Payer: Self-pay | Admitting: Orthopaedic Surgery

## 2019-05-14 VITALS — BP 110/65 | HR 80 | Ht 59.0 in | Wt 106.0 lb

## 2019-05-14 DIAGNOSIS — M4722 Other spondylosis with radiculopathy, cervical region: Secondary | ICD-10-CM | POA: Diagnosis not present

## 2019-05-14 MED ORDER — PREDNISONE 10 MG PO TABS: ORAL_TABLET | ORAL | 0 refills | Status: DC

## 2019-05-14 NOTE — Progress Notes (Signed)
Office Visit Note   Patient: Nicole Blackburn           Date of Birth: 1961/03/20           MRN: [id number] Visit Date: 05/14/2019              Requested by: [address], [place], [place] [address] [place],  [place] [address] PCP: Zola Button, Grayling Congress, DO   Assessment & Plan: Visit Diagnoses:  1. Other spondylosis with radiculopathy, cervical region     Plan: Patient has severe foraminal stenosis on the left at C5-6 with spondylosis and moderate at C6-7 on the left.  She does not have any myelopathy but has foraminal compression at 2 levels with spondylosis loss of normal curvature.  We discussed two-level anterior cervical fusion at C5-6 and C6-7  with allograft and plate, overnight stay in the hospital use of a drain overnight.  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  We discussed risks of dysphasia, dysphonia, potential for pseudoarthrosis, possibility of posterior cervical fusion necessary if she has symptomatic pseudoarthrosis.  Questions were elicited and answered she understands and requests we proceed.  Follow-Up Instructions: Return pre-op.   Orders:  No orders of the defined types were placed in this encounter.  Meds ordered this encounter  Medications  . predniSONE (DELTASONE) 10 MG tablet    Sig: Take one tablet daily with meals x 10 days.    Dispense:  10 tablet    Refill:  0      Procedures: No procedures performed   Clinical Data: No additional findings.   Subjective: Chief Complaint  Patient presents with  . 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  MRI scan 05/03/2019 shows broad-based disc bulge with severe left foraminal  stenosis and mild right foraminal stenosis.  Mild central stenosis.  C6-7 shows broad-based disc bulge with moderate left foraminal stenosis and mild right foraminal stenosis.  She states she wants a refill of the prednisone and would like to proceed with surgical scheduling.  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  She has been on anti-inflammatories, muscle relaxants without relief.  Pain is worse on the left and right radiates in the clavicle and of the proximal arm and stopped close to the elbow.  She denies gait problems no chills or fever.  Pain with rotation and tilting of her neck.  Review of Systems 14 point systems update positive for lumbar scoliosis, history of anemia, hypertension, cervical spondylosis with left arm radicular symptoms.  Negative for chest pain negative for CVA.  Otherwise negative is a pertains HPI.   Objective: Vital Signs: BP 110/65   Pulse 80   Ht 4\' 11"  (1.499 m)   Wt 106 lb (48.1 kg)   LMP 02/24/2015   BMI 21.41 kg/m   Physical Exam Constitutional:      Appearance: She is well-developed.  HENT:     Head: Normocephalic.     Right Ear: External ear normal.  Left Ear: External ear normal.  Eyes:     Pupils: Pupils are equal, round, and reactive to light.  Neck:     Thyroid: No thyromegaly.     Trachea: No tracheal deviation.  Cardiovascular:     Rate and Rhythm: Normal rate.  Pulmonary:     Effort: Pulmonary effort is normal.  Abdominal:     Palpations: Abdomen is soft.  Skin:    General: Skin is warm and dry.  Neurological:     Mental Status: She is alert and oriented to person, place, and time.  Psychiatric:        Behavior: Behavior normal.     Ortho Exam patient has increased pain with neck flexion and cervical compression positive brachial plexus tenderness on  the left.  Intact upper extremity and lower extremity reflexes normal heel toe gait.  Negative impingement shoulders right and left.  Specialty Comments:  No specialty comments available.  Imaging: No results found.\CLINICAL DATA:  Neck pain radiating into the collar bones.  EXAM: MRI CERVICAL SPINE WITHOUT CONTRAST  TECHNIQUE: Multiplanar, multisequence MR imaging of the cervical spine was performed. No intravenous contrast was administered.  COMPARISON:  None.  FINDINGS: Alignment: Physiologic.  Vertebrae: No fracture, evidence of discitis, or bone lesion.  Cord: Normal signal and morphology.  Posterior Fossa, vertebral arteries, paraspinal tissues: Posterior fossa demonstrates no focal abnormality. Vertebral artery flow voids are maintained. Paraspinal soft tissues are unremarkable.  Disc levels:  Discs: Degenerative disc disease with disc height loss at C5-6 and C6-7.  C2-3: No significant disc bulge. No neural foraminal stenosis. No central canal stenosis.  C3-4: Broad-based disc bulge. Mild bilateral facet arthropathy. Mild left foraminal stenosis. No central canal stenosis.  C4-5: No significant disc bulge. Mild left facet arthropathy. No foraminal stenosis. No central canal stenosis.  C5-6: Broad-based disc bulge with a broad central disc protrusion impressing on the thecal sac. Left uncovertebral degenerative changes. Severe left foraminal stenosis. Mild right foraminal stenosis. Mild central canal stenosis.  C6-7: Broad-based disc bulge with a small left paracentral disc protrusion. Bilateral uncovertebral degenerative changes. Mild right and moderate left foraminal stenosis. No central canal stenosis.  C7-T1: No significant disc bulge. No neural foraminal stenosis. No central canal stenosis.  IMPRESSION: 1. Cervical spine spondylosis as described above. 2. At C5-6 there is a broad-based disc bulge with a broad central disc protrusion  impressing on the thecal sac. Left uncovertebral degenerative changes. Severe left foraminal stenosis. Mild right foraminal stenosis. Mild central canal stenosis. 3. At C6-7 there is a broad-based disc bulge with a small left paracentral disc protrusion. Bilateral uncovertebral degenerative changes. Mild right and moderate left foraminal stenosis.   Electronically Signed   By: Elige KoHetal  Patel   On: 05/03/2019 12:21  Cervical spine AP lateral x-ray on 03/20/2019 showed spondylosis changes C5-6 C6-7 with 1 mm anterolisthesis at C6-7.  Uncovertebral changes and facet arthropathy left greater than right.  PMFS History: Patient Active Problem List   Diagnosis Date Noted  . Other spondylosis with radiculopathy, cervical region 03/20/2019  . Dizzy 09/30/2018  . History of scoliosis 09/30/2018  . Right hip pain 09/30/2018  . Allergies 09/30/2018  . Rash 03/25/2018  . Fever 03/25/2018  . Muscle spasm 03/25/2018  . HTN (hypertension) 07/20/2015  . Chronic cough 12/11/2012  . Idiopathic scoliosis 12/11/2012  . Anemia    Past Medical History:  Diagnosis Date  . Abnormal pap 1997/1998   cryo  . Anemia   . Hypertension  Family History  Problem Relation Age of Onset  . COPD Mother   . Osteoporosis Mother   . Pulmonary embolism Mother   . Diabetes Father   . Hypertension Father   . Colon polyps Father   . Hypertension Sister   . Hypertension Brother   . Stroke Maternal Grandmother 60  . Cancer Paternal Grandmother 61       multiple myloma  . Cancer Paternal Grandfather        ? kind cancer    Past Surgical History:  Procedure Laterality Date  . REFRACTIVE SURGERY Bilateral 2012   Dr Gershon Crane  . WISDOM TOOTH EXTRACTION  age 17's   Social History   Occupational History  . Occupation: fresh Counselling psychologist  Tobacco Use  . Smoking status: Never Smoker  . Smokeless tobacco: Never Used  Substance and Sexual Activity  . Alcohol use: Yes    Alcohol/week: 3.0  standard drinks    Types: 3 Standard drinks or equivalent per week    Comment: Socially  . Drug use: No  . Sexual activity: Yes    Partners: Male    Birth control/protection: Post-menopausal

## 2019-05-25 ENCOUNTER — Other Ambulatory Visit: Payer: Self-pay | Admitting: Family Medicine

## 2019-05-28 ENCOUNTER — Encounter: Payer: Self-pay | Admitting: Orthopaedic Surgery

## 2019-05-28 ENCOUNTER — Encounter: Payer: Self-pay | Admitting: Radiology

## 2019-05-28 DIAGNOSIS — M4722 Other spondylosis with radiculopathy, cervical region: Secondary | ICD-10-CM

## 2019-06-04 ENCOUNTER — Encounter: Payer: Self-pay | Admitting: Physical Therapy

## 2019-06-04 ENCOUNTER — Ambulatory Visit: Payer: Managed Care, Other (non HMO) | Attending: Orthopaedic Surgery | Admitting: Physical Therapy

## 2019-06-04 ENCOUNTER — Other Ambulatory Visit: Payer: Self-pay

## 2019-06-04 DIAGNOSIS — R293 Abnormal posture: Secondary | ICD-10-CM | POA: Diagnosis present

## 2019-06-04 DIAGNOSIS — M62838 Other muscle spasm: Secondary | ICD-10-CM | POA: Insufficient documentation

## 2019-06-04 DIAGNOSIS — M542 Cervicalgia: Secondary | ICD-10-CM | POA: Insufficient documentation

## 2019-06-04 DIAGNOSIS — M6281 Muscle weakness (generalized): Secondary | ICD-10-CM | POA: Insufficient documentation

## 2019-06-04 NOTE — Therapy (Signed)
[facility] [address]  [place], [place], [address] Phone: [phone]   Fax:  [phone]  Physical Therapy Evaluation  Patient Details  Name: Nicole Blackburn MRN: [id number] Date of Birth: 1960-10-14 Referring Provider (PT): Rodell Perna, MD   Encounter Date: 06/04/2019  PT End of Session - 06/04/19 1054    Visit Number  1    Number of Visits  9    Date for PT Re-Evaluation  07/30/19    Authorization Type  [employer]    PT Start Time  0759    PT Stop Time  0836    PT Time Calculation (min)  37 min    Activity Tolerance  Patient tolerated treatment well;Patient limited by pain    Behavior During Therapy  [facility] for tasks assessed/performed       Past Medical History:  Diagnosis Date  . Abnormal pap 1997/1998   cryo  . Anemia   . Hypertension     Past Surgical History:  Procedure Laterality Date  . REFRACTIVE SURGERY Bilateral 2012   Dr Gershon Crane  . WISDOM TOOTH EXTRACTION  age 45's    There were no vitals filed for this visit.   Subjective Assessment - 06/04/19 0801    Subjective  Patient reports neck pain began in May 2020. Started working from home in March so she tried to change her chair and home set up, but this did not help. MD recommending cervical fusion, but trying conservative treatment first. Pain is located over L>R posterior neck down to shoulders and anterior chest, with intermittent HAs along either side of head. Has HA's almost daily- worst in AM. Denies N/T. Worse with turning head while driving, taking shirt off overhead, picking up dog. Having trouble lifting and carrying heavy items. Better with Prednisone. Only able to sleep in supine.    Pertinent History  HTN, anemia    Limitations  Lifting;House hold activities    Diagnostic tests  MRI scan 05/03/2019 shows broad-based disc bulge with severe left foraminal stenosis and mild right foraminal stenosis.  Mild central stenosis.  C6-7 shows broad-based  disc bulge with moderate left foraminal stenosis and mild right foraminal stenosis.    Patient Stated Goals  "i want the pain to go away"    Currently in Pain?  Yes    Pain Score  6     Pain Location  Shoulder    Pain Orientation  Right;Left    Pain Descriptors / Indicators  Aching    Pain Type  Chronic pain         [facility] PT Assessment - 06/04/19 0811      Assessment   Medical Diagnosis  Other spondylosis with radiculopathy, cervical region    Referring Provider (PT)  Rodell Perna, MD    Onset Date/Surgical Date  01/11/19    Hand Dominance  Right    Next MD Visit  not scheduled    Prior Therapy  yes- [facility]      Precautions   Precautions  None      Balance Screen   Has the patient fallen in the past 6 months  No    Has the patient had a decrease in activity level because of a fear of falling?   No    Is the patient reluctant to leave their home because of a fear of falling?   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    Leisure  exercising      Cognition   Overall Cognitive Status  Within Functional Limits for tasks assessed      Observation/Other Assessments   Focus on Therapeutic Outcomes (FOTO)   Neck: 42 (58% limited, 41% predicted)      Sensation   Light Touch  Appears Intact      Coordination   Gross Motor Movements are Fluid and Coordinated  Yes      Posture/Postural Control   Posture/Postural Control  Postural limitations    Postural Limitations  Rounded Shoulders;Forward head    Posture Comments  R shoulder elevated      ROM / Strength   AROM / PROM / Strength  AROM;Strength      AROM   AROM Assessment Site  Cervical    Cervical Flexion  27   moderate pain   Cervical Extension  30     Cervical - Right Side Bend  18   moderate pain   Cervical - Left Side Bend  12   severe pain   Cervical - Right Rotation  20   moderate pain   Cervical - Left Rotation  25   moderate pain     Strength   Strength Assessment Site  Shoulder    Right/Left Shoulder  Right;Left    Right Shoulder Flexion  4+/5    Right Shoulder ABduction  3+/5    Right Shoulder Internal Rotation  3+/5    Right Shoulder External Rotation  3+/5    Left Shoulder Flexion  4/5    Left Shoulder ABduction  3+/5   pain over L shoulder and chest   Left Shoulder Internal Rotation  3/5   pain   Left Shoulder External Rotation  3+/5   pain     Palpation   Palpation comment  TTP and increased tone in B UT, scalenes, LS, cervical paraspinals, and pecs                Objective measurements completed on examination: See above findings.              PT Education - 06/04/19 1054    Education Details  prognosis, POC, HEP; benefits of PT for neck pain    Person(s) Educated  Patient    Methods  Explanation;Demonstration;Tactile cues;Verbal cues;Handout    Comprehension  Verbalized understanding;Returned demonstration       PT Short Term Goals - 06/04/19 1159      PT SHORT TERM GOAL #1   Title  Patient to be independent with intial HEP.    Time  3    Period  Weeks    Status  New    Target Date  06/25/19        PT Long Term Goals - 06/04/19 1159      PT LONG TERM GOAL #1   Title  Patient to be independent with advanced HEP.    Time  8    Period  Weeks    Status  New    Target Date  07/30/19      PT LONG TERM GOAL #2   Title  Patient to demonstrate cervical AROM WFL and without pain limiting.  Time  8    Period  Weeks    Status  New    Target Date  07/30/19      PT LONG TERM GOAL #3   Title  Patient to demonstrate B shoulder strength >=4+/5.    Time  8    Period  Weeks    Status  New    Target Date  07/30/19      PT LONG TERM GOAL #4   Title  Patient to demonstrate  functional and symmetrical grip strength in B hands to improve ability to open jars.    Time  8    Period  Weeks    Status  New    Target Date  07/30/19      PT LONG TERM GOAL #5   Title  Patient to report 80% improvement in driving tolerance d/t pain relief.    Time  8    Period  Weeks    Status  New    Target Date  07/30/19             Plan - 06/04/19 1055    Clinical Impression Statement  Patient is a 58y/o F presenting to OPPT with c/o L>R sided neck pain with radiation into B shoulders and chest for the past 4 months. Patient endorses almost daily HAs in ram's horn pattern. Denies N/T. Pain is worse with turned head to drive, taking a shirt off overhead, picking up her dog. Notices weakness in her arms, which makes lifting difficulty. Patient currently works from home, requiring being on a computer all day. Presented today with limited and painful cervical AROM, decreased B shoulder strength, abnormal posture, and tenderness and soft tissue restriction over cervical and shoulder musculature. Educated patient on gentle stretching and postural correction HEP- patient reported understanding. Would benefit form skilled PT services 1x/week for 8 weeks to address aforementioned impairments.    Personal Factors and Comorbidities  Age;Comorbidity 2;Time since onset of injury/illness/exacerbation;Fitness;Profession;Past/Current Experience    Comorbidities  HTN, anemia    Examination-Activity Limitations  Sleep;Caring for Others;Carry;Dressing;Hygiene/Grooming;Lift;Reach Overhead    Examination-Participation Restrictions  Cleaning;Shop;Community Activity;Driving;Yard Work;Interpersonal Relationship;Laundry;Meal Prep    Stability/Clinical Decision Making  Stable/Uncomplicated    Clinical Decision Making  Low    Rehab Potential  Good    PT Frequency  1x / week    PT Duration  8 weeks    PT Treatment/Interventions  ADLs/Self Care Home Management;Cryotherapy;Electrical Stimulation;Moist  Heat;Traction;Therapeutic exercise;Therapeutic activities;Functional mobility training;Ultrasound;Neuromuscular re-education;Patient/family education;Manual techniques;Taping;Energy conservation;Dry needling;Passive range of motion    PT Next Visit Plan  reassess HEP; assess grip strength    Consulted and Agree with Plan of Care  Patient       Patient will benefit from skilled therapeutic intervention in order to improve the following deficits and impairments:  Hypomobility, Decreased activity tolerance, Decreased strength, Pain, Impaired UE functional use, Increased muscle spasms, Improper body mechanics, Decreased range of motion, Impaired flexibility, Postural dysfunction  Visit Diagnosis: Cervicalgia  Muscle weakness (generalized)  Other muscle spasm  Abnormal posture     Problem List Patient Active Problem List   Diagnosis Date Noted  . Other spondylosis with radiculopathy, cervical region 03/20/2019  . Dizzy 09/30/2018  . History of scoliosis 09/30/2018  . Right hip pain 09/30/2018  . Allergies 09/30/2018  . Rash 03/25/2018  . Fever 03/25/2018  . Muscle spasm 03/25/2018  . HTN (hypertension) 07/20/2015  . Chronic cough 12/11/2012  . Idiopathic scoliosis 12/11/2012  . Anemia     Denton Ar  Namon Cirri, PT, DPT 06/04/19 12:04 PM   [facility] [address]  [address] [place], [place], [address] Phone: [phone]   Fax:  [phone]  Name: Nicole Blackburn MRN: [id number] Date of Birth: 23-Sep-1960

## 2019-06-11 ENCOUNTER — Other Ambulatory Visit: Payer: Self-pay

## 2019-06-11 ENCOUNTER — Ambulatory Visit: Payer: Managed Care, Other (non HMO)

## 2019-06-11 DIAGNOSIS — R293 Abnormal posture: Secondary | ICD-10-CM

## 2019-06-11 DIAGNOSIS — M542 Cervicalgia: Secondary | ICD-10-CM

## 2019-06-11 DIAGNOSIS — M62838 Other muscle spasm: Secondary | ICD-10-CM

## 2019-06-11 DIAGNOSIS — M6281 Muscle weakness (generalized): Secondary | ICD-10-CM

## 2019-06-11 NOTE — Therapy (Signed)
[facility] [address]  [address] [place], [place], [address] Phone: [phone]   Fax:  [phone]  Physical Therapy Treatment  Patient Details  Name: Nicole Blackburn MRN: [id number] Date of Birth: 1961-08-31 Referring Provider (PT): Annell Greening, MD   Encounter Date: 06/11/2019  PT End of Session - 06/11/19 0806    Visit Number  2    Number of Visits  9    Date for PT Re-Evaluation  07/30/19    Authorization Type  [employer]    PT Start Time  0800    PT Stop Time  0855    PT Time Calculation (min)  55 min    Activity Tolerance  Patient tolerated treatment well;Patient limited by pain    Behavior During Therapy  [facility] for tasks assessed/performed       Past Medical History:  Diagnosis Date  . Abnormal pap 1997/1998   cryo  . Anemia   . Hypertension     Past Surgical History:  Procedure Laterality Date  . REFRACTIVE SURGERY Bilateral 2012   Dr Nile Riggs  . WISDOM TOOTH EXTRACTION  age 57's    There were no vitals filed for this visit.  Subjective Assessment - 06/11/19 0815    Subjective  Has been performing HEP daily.    Pertinent History  HTN, anemia    Diagnostic tests  MRI scan 05/03/2019 shows broad-based disc bulge with severe left foraminal stenosis and mild right foraminal stenosis.  Mild central stenosis.  C6-7 shows broad-based disc bulge with moderate left foraminal stenosis and mild right foraminal stenosis.    Patient Stated Goals  "i want the pain to go away"    Currently in Pain?  Yes    Pain Score  5     Pain Location  Shoulder    Pain Orientation  Right;Left    Pain Descriptors / Indicators  Aching;Sore    Pain Type  Chronic pain    Pain Radiating Towards  Pain radiating to anterior chest and into B shoulders from neck    Pain Onset  More than a month ago    Pain Frequency  Constant    Aggravating Factors   Overhead reaching, reaching down, sleeping on side    Pain Relieving Factors  prednisone    Multiple Pain Sites  Yes    Pain Score  3    Pain Location  Head    Pain Orientation  Right;Left    Pain Descriptors / Indicators  Aching    Pain Type  Chronic pain    Pain Radiating Towards  radiating B lateral head toward anterior head    Pain Onset  More than a month ago    Pain Frequency  Intermittent    Aggravating Factors   mornings    Pain Relieving Factors  unsure                       [facility] Adult PT Treatment/Exercise - 06/11/19 0001      Neck Exercises: Machines for Strengthening   UBE (Upper Arm Bike)  Lvl 1.0, 3 min forwards/3 min backwards       Neck Exercises: Seated   Neck Retraction  10 reps;5 secs    Neck Retraction Limitations  Min cueing to avoid painful end range     Other Seated Exercise  B scapular retraction 5" x 10 reps      Modalities   Modalities  Electrical Stimulation;Moist  Heat      Moist Heat Therapy   Number Minutes Moist Heat  10 Minutes    Moist Heat Location  Shoulder   B UT     Electrical Stimulation   Electrical Stimulation Location  B UT     Electrical Stimulation Action  IFC    Electrical Stimulation Parameters  tolerance, 10'    Electrical Stimulation Goals  Pain;Tone      Manual Therapy   Manual Therapy  Soft tissue mobilization    Manual therapy comments  seated     Soft tissue mobilization  B STM to B UT, LS, shoulder complex - very ttp in B mid UT L > R      Neck Exercises: Stretches   Upper Trapezius Stretch  Right;Left;30 seconds;2 reps    Upper Trapezius Stretch Limitations  B - hand anchored on table     Levator Stretch  Right;Left;30 seconds;2 reps    Levator Stretch Limitations  B - hand anchored on table     Chest Stretch  2 reps;30 seconds    Chest Stretch Limitations  low and mid in doorway                PT Short Term Goals - 06/11/19 1220      PT SHORT TERM GOAL #1   Title  Patient to be independent with intial HEP.    Time  3    Period  Weeks    Status  On-going    Target Date   06/25/19        PT Long Term Goals - 06/11/19 1220      PT LONG TERM GOAL #1   Title  Patient to be independent with advanced HEP.    Time  8    Period  Weeks    Status  On-going      PT LONG TERM GOAL #2   Title  Patient to demonstrate cervical AROM WFL and without pain limiting.    Time  8    Period  Weeks    Status  On-going      PT LONG TERM GOAL #3   Title  Patient to demonstrate B shoulder strength >=4+/5.    Time  8    Period  Weeks    Status  On-going      PT LONG TERM GOAL #4   Title  Patient to demonstrate functional and symmetrical grip strength in B hands to improve ability to open jars.    Time  8    Period  Weeks    Status  On-going      PT LONG TERM GOAL #5   Title  Patient to report 80% improvement in driving tolerance d/t pain relief.    Time  8    Period  Weeks    Status  On-going            Plan - 06/11/19 0819    Clinical Impression Statement  Nicole Blackburn reporting she has been performing HEP daily and, "unsure if they are making any difference".  Pt. encouraged that she has just started therapy and to continue daily adherence to HEP. Reviewed HEP today to ensure proper technique with only minor cueing required with scap. retraction to prevent excessive scap elevation.  MT addressing tenderness in B upper shoulder musculature today.  Ended visit with E-stim/moist heat to upper back and cervical musculature for hopeful reduction in tenderness and tone.    Personal Factors and Comorbidities  Age;Comorbidity 2;Time since onset of injury/illness/exacerbation;Fitness;Profession;Past/Current Experience    Comorbidities  HTN, anemia    Rehab Potential  Good    PT Treatment/Interventions  ADLs/Self Care Home Management;Cryotherapy;Electrical Stimulation;Moist Heat;Traction;Therapeutic exercise;Therapeutic activities;Functional mobility training;Ultrasound;Neuromuscular re-education;Patient/family education;Manual techniques;Taping;Energy conservation;Dry  needling;Passive range of motion    PT Next Visit Plan  Assess grip strength    Consulted and Agree with Plan of Care  Patient       Patient will benefit from skilled therapeutic intervention in order to improve the following deficits and impairments:  Hypomobility, Decreased activity tolerance, Decreased strength, Pain, Impaired UE functional use, Increased muscle spasms, Improper body mechanics, Decreased range of motion, Impaired flexibility, Postural dysfunction  Visit Diagnosis: Cervicalgia  Muscle weakness (generalized)  Other muscle spasm  Abnormal posture     Problem List Patient Active Problem List   Diagnosis Date Noted  . Other spondylosis with radiculopathy, cervical region 03/20/2019  . Dizzy 09/30/2018  . History of scoliosis 09/30/2018  . Right hip pain 09/30/2018  . Allergies 09/30/2018  . Rash 03/25/2018  . Fever 03/25/2018  . Muscle spasm 03/25/2018  . HTN (hypertension) 07/20/2015  . Chronic cough 12/11/2012  . Idiopathic scoliosis 12/11/2012  . Anemia     Kermit BaloMicah Priscila Bean, PTA 06/11/19 12:21 PM   [facility] [place] [address]  [address] [place], [place], [address] Phone: [phone]   Fax:  [phone]  Name: Nicole PartridgeLisa Blackburn MRN: [id number] Date of Birth: 01-28-1961

## 2019-06-18 ENCOUNTER — Other Ambulatory Visit: Payer: Self-pay

## 2019-06-18 ENCOUNTER — Ambulatory Visit: Payer: Managed Care, Other (non HMO) | Attending: Orthopaedic Surgery

## 2019-06-18 DIAGNOSIS — M542 Cervicalgia: Secondary | ICD-10-CM | POA: Diagnosis not present

## 2019-06-18 DIAGNOSIS — M62838 Other muscle spasm: Secondary | ICD-10-CM | POA: Diagnosis present

## 2019-06-18 DIAGNOSIS — R293 Abnormal posture: Secondary | ICD-10-CM

## 2019-06-18 DIAGNOSIS — M6281 Muscle weakness (generalized): Secondary | ICD-10-CM | POA: Diagnosis present

## 2019-06-18 NOTE — Therapy (Addendum)
[facility] [place] [address]  [address] [place], [place], [address] Phone: [phone]   Fax:  [phone]  Physical Therapy Treatment  Patient Details  Name: Nicole PartridgeLisa Blackburn MRN: [id number] Date of Birth: 15-Aug-1961 Referring Provider (PT): Annell GreeningMark Yates, MD   Encounter Date: 06/18/2019  PT End of Session - 06/18/19 0809    Visit Number  3    Number of Visits  9    Date for PT Re-Evaluation  07/30/19    Authorization Type  [employer]    PT Start Time  0802    PT Stop Time  0845    PT Time Calculation (min)  43 min    Activity Tolerance  Patient tolerated treatment well;Patient limited by pain    Behavior During Therapy  [facility] assessed/per[facility]       Past Medical History:  Diagnosis Date  . Abnormal pap 1997/1998   cryo  . Anemia   . Hypertension     Past Surgical History:  Procedure Laterality Date  . REFRACTIVE SURGERY Bilateral 2012   Dr Nile RiggsShapiro  . WISDOM TOOTH EXTRACTION  age 58's    There were no vitals filed for this visit.  Subjective Assessment - 06/18/19 0807    Subjective  Performing HEP daily.  Noticed releif from pain for rest of day after last visit.    Pertinent History  HTN, anemia    Diagnostic tests  MRI scan 05/03/2019 shows broad-based disc bulge with severe left foraminal stenosis and mild right foraminal stenosis.  Mild central stenosis.  C6-7 shows broad-based disc bulge with moderate left foraminal stenosis and mild right foraminal stenosis.    Patient Stated Goals  "i want the pain to go away"    Currently in Pain?  Yes    Pain Score  7     Pain Location  Shoulder    Pain Orientation  Right;Left    Pain Descriptors / Indicators  Aching;Sore    Pain Type  Chronic pain    Pain Radiating Towards  Pain radiating to anterior chest and into B shoulders from neck    Pain Frequency  Constant    Multiple Pain Sites  No         [facility] PT Assessment - 06/18/19 0001      Strength   Right Hand Grip  (lbs)  26.67   35#, 35#, 20#   Left Hand Grip (lbs)  16.67   17#, 17#, 16#                   [facility] Adult PT Treatment/Exercise - 06/18/19 0001      Self-Care   Self-Care  Other Self-Care Comments    Other Self-Care Comments   Educated pt. on home TENS unit "TENS 7000 2nd edition" where to purchase and electrode placement, and general use instructions as pt. has gotten some relief from E-stim last visit      Neck Exercises: Machines for Strengthening   UBE (Upper Arm Bike)  Lvl 1.0, 3 min forwards/3 min backwards       Neck Exercises: Seated   Neck Retraction  10 reps;5 secs    Neck Retraction Limitations  Min cueing to avoid painful end range       Moist Heat Therapy   Number Minutes Moist Heat  15 Minutes    Moist Heat Location  Shoulder      Electrical Stimulation   Electrical Stimulation Location  B UT  Electrical Stimulation Action  IFC    Electrical Stimulation Parameters  to tolerance, 15'    Electrical Stimulation Goals  Pain;Tone      Manual Therapy   Manual Therapy  Soft tissue mobilization    Manual therapy comments  seated     Soft tissue mobilization  B STM to B UT, LS - ttp in mid L UT > R UT      Neck Exercises: Stretches   Upper Trapezius Stretch  Right;Left;30 seconds;2 reps    Upper Trapezius Stretch Limitations  B - hand anchored on table     Levator Stretch  Right;Left;30 seconds;2 reps    Levator Stretch Limitations  B - hand anchored on table              PT Education - 06/18/19 0831    Education Details  TENS educational handout including electrode placement and setup, UT, LS stretch    Person(s) Educated  Patient    Methods  Explanation;Verbal cues;Handout    Comprehension  Verbalized understanding;Verbal cues required       PT Short Term Goals - 06/11/19 1220      PT SHORT TERM GOAL #1   Title  Patient to be independent with intial HEP.    Time  3    Period  Weeks    Status  On-going    Target Date  06/25/19         PT Long Term Goals - 06/11/19 1220      PT LONG TERM GOAL #1   Title  Patient to be independent with advanced HEP.    Time  8    Period  Weeks    Status  On-going      PT LONG TERM GOAL #2   Title  Patient to demonstrate cervical AROM WFL and without pain limiting.    Time  8    Period  Weeks    Status  On-going      PT LONG TERM GOAL #3   Title  Patient to demonstrate B shoulder strength >=4+/5.    Time  8    Period  Weeks    Status  On-going      PT LONG TERM GOAL #4   Title  Patient to demonstrate functional and symmetrical grip strength in B hands to improve ability to open jars.    Time  8    Period  Weeks    Status  On-going      PT LONG TERM GOAL #5   Title  Patient to report 80% improvement in driving tolerance d/t pain relief.    Time  8    Period  Weeks    Status  On-going            Plan - 06/18/19 1253    Clinical Impression Statement  Nicole Blackburn reporting relief after last visit for remainder of day which she attributes to use of moist heat/E-stim.  Tolerated mild progression of cervical/scapular strengthening activities and continued MT to address increased tone and tenderness in B upper shoulder well today.  Ended visit with E-stim/moist heat to upper shoulders to reduce pain and tone.    Personal Factors and Comorbidities  Age;Comorbidity 2;Time since onset of injury/illness/exacerbation;Fitness;Profession;Past/Current Experience    Comorbidities  HTN, anemia    Rehab Potential  Good    PT Treatment/Interventions  ADLs/Self Care Home Management;Cryotherapy;Electrical Stimulation;Moist Heat;Traction;Therapeutic exercise;Therapeutic activities;Functional mobility training;Ultrasound;Neuromuscular re-education;Patient/family education;Manual techniques;Taping;Energy conservation;Dry needling;Passive range of motion  Consulted and Agree with Plan of Care  Patient       Patient will benefit from skilled therapeutic intervention in order to improve the  following deficits and impairments:  Hypomobility, Decreased activity tolerance, Decreased strength, Pain, Impaired UE functional use, Increased muscle spasms, Improper body mechanics, Decreased range of motion, Impaired flexibility, Postural dysfunction  Visit Diagnosis: Cervicalgia  Muscle weakness (generalized)  Other muscle spasm  Abnormal posture     Problem List Patient Active Problem List   Diagnosis Date Noted  . Other spondylosis with radiculopathy, cervical region 03/20/2019  . Dizzy 09/30/2018  . History of scoliosis 09/30/2018  . Right hip pain 09/30/2018  . Allergies 09/30/2018  . Rash 03/25/2018  . Fever 03/25/2018  . Muscle spasm 03/25/2018  . HTN (hypertension) 07/20/2015  . Chronic cough 12/11/2012  . Idiopathic scoliosis 12/11/2012  . Anemia     Bess Harvest, PTA 06/18/19 2:27 PM   [facility] [address]  [place], [place], [address] Phone: [phone]   Fax:  [phone]  Name: Nicole Blackburn MRN: [id number] Date of Birth: 07/18/1961

## 2019-06-18 NOTE — Patient Instructions (Addendum)
TENS UNIT ? ?This is helpful for muscle pain and spasm.  ? ?Search and Purchase a TENS 7000 2nd edition at www.tenspros.com or www.amazon.com  (It should be less than $30)  ? ? ? ?TENS unit instructions:  ?Do not shower or bathe with the unit on ?Turn the unit off before removing electrodes or batteries ?If the electrodes lose stickiness add a drop of water to the electrodes after they are disconnected from the unit and place on plastic sheet. If you continued to have difficulty, call the TENS unit company to purchase more electrodes. ?Do not apply lotion on the skin area prior to use. Make sure the skin is clean and dry as this will help prolong the life of the electrodes. ?After use, always check skin for unusual red areas, rash or other skin difficulties. If there are any skin problems, does not apply electrodes to the same area. ?Never remove the electrodes from the unit by pulling the wires. ?Do not use the TENS unit or electrodes other than as directed. ?Do not change electrode placement without consulting your therapist or physician. ?Keep 2 fingers with between each electrode.  ? ?TENS stands for Transcutaneous Electrical Nerve Stimulation. In other words, electrical impulses are allowed to pass through the skin in order to excite a nerve.  ? ?Purpose and Use of TENS:  ?TENS is a method used to manage acute and chronic pain without the use of drugs. It has been effective in managing pain associated with surgery, sprains, strains, trauma, rheumatoid arthritis, and neuralgias. It is a non-addictive, low risk, and non-invasive technique used to control pain. It is not, by any means, a curative form of treatment.  ? ?How TENS Works:  ?Most TENS units are a small pocket-sized unit powered by one 9 volt battery. Attached to the outside of the unit are two lead wires where two pins and/or snaps connect on each wire. All units come with a set of four reusable pads or electrodes. These are placed on the skin  surrounding the area involved. By inserting the leads into  the pads, the electricity can pass from the unit making the circuit complete.  ?As the intensity is turned up slowly, the electrical current enters the body from the electrodes through the skin to the surrounding nerve fibers. This triggers the release of hormones from within the body. These hormones contain pain relievers. By increasing the circulation of these hormones, the person?s pain may be lessened. It is also believed that the electrical stimulation itself helps to block the pain messages being sent to the brain, thus also decreasing the body?s perception of pain.  ? ?Hazards:  ?TENS units are NOT to be used by patients with PACEMAKERS, DEFIBRILLATORS, DIABETIC PUMPS, PREGNANT WOMEN, and patients with SEIZURE DISORDERS.  ?TENS units are NOT to be used over the heart, throat, brain, or spinal cord.  ?One of the major side effects from the TENS unit may be skin irritation. Some people may develop a rash if they are sensitive to the materials used in the electrodes or the connecting wires.  ? ?Wear the unit for 15 min.  ? ?Avoid overuse due the body getting used to the stem making it not as effective over time.  ?  ?

## 2019-06-21 ENCOUNTER — Encounter: Payer: Self-pay | Admitting: Family Medicine

## 2019-06-24 ENCOUNTER — Encounter: Payer: Self-pay | Admitting: Orthopaedic Surgery

## 2019-06-24 MED ORDER — PREDNISONE 10 MG PO TABS: ORAL_TABLET | ORAL | 0 refills | Status: DC

## 2019-06-25 ENCOUNTER — Other Ambulatory Visit: Payer: Self-pay

## 2019-06-25 ENCOUNTER — Encounter: Payer: Self-pay | Admitting: Physical Therapy

## 2019-06-25 ENCOUNTER — Ambulatory Visit: Payer: Managed Care, Other (non HMO) | Admitting: Physical Therapy

## 2019-06-25 DIAGNOSIS — R293 Abnormal posture: Secondary | ICD-10-CM

## 2019-06-25 DIAGNOSIS — M542 Cervicalgia: Secondary | ICD-10-CM | POA: Diagnosis not present

## 2019-06-25 DIAGNOSIS — M6281 Muscle weakness (generalized): Secondary | ICD-10-CM

## 2019-06-25 DIAGNOSIS — M62838 Other muscle spasm: Secondary | ICD-10-CM

## 2019-06-25 NOTE — Therapy (Signed)
[facility] [address]  [address] [place], [place], [address] Phone: [phone]   Fax:  [phone]  Physical Therapy Treatment  Patient Details  Name: Nicole Blackburn MRN: [id number] Date of Birth: 23-Jan-1961 Referring Provider (PT): Annell Greening, MD   Encounter Date: 06/25/2019  PT End of Session - 06/25/19 0851    Visit Number  4    Number of Visits  9    Date for PT Re-Evaluation  07/30/19    Authorization Type  [employer]    PT Start Time  0802    PT Stop Time  0858    PT Time Calculation (min)  56 min    Activity Tolerance  Patient tolerated treatment well    Behavior During Therapy  [facility] for tasks assessed/performed       Past Medical History:  Diagnosis Date  . Abnormal pap 1997/1998   cryo  . Anemia   . Hypertension     Past Surgical History:  Procedure Laterality Date  . REFRACTIVE SURGERY Bilateral 2012   Dr Nile Riggs  . WISDOM TOOTH EXTRACTION  age 63's    There were no vitals filed for this visit.  Subjective Assessment - 06/25/19 0801    Subjective  Had a rough weekend and had to ask her MD for more Prednisone. Had to pack up her office on Friday and feels this may have contributed. Feeling better today.    Pertinent History  HTN, anemia    Diagnostic tests  MRI scan 05/03/2019 shows broad-based disc bulge with severe left foraminal stenosis and mild right foraminal stenosis.  Mild central stenosis.  C6-7 shows broad-based disc bulge with moderate left foraminal stenosis and mild right foraminal stenosis.    Patient Stated Goals  "i want the pain to go away"    Currently in Pain?  Yes    Pain Score  4     Pain Location  Neck    Pain Orientation  Right    Pain Descriptors / Indicators  Aching    Pain Type  Chronic pain                       [facility] Adult PT Treatment/Exercise - 06/25/19 0001      Exercises   Exercises  Neck      Neck Exercises: Machines for Strengthening   UBE (Upper  Arm Bike)  Lvl 1.0, 3 min forwards/3 min backwards       Neck Exercises: Seated   Other Seated Exercise  rotation SNAG x10 each side to tolerance   manual assistance for set up and cues for slight retraction   Other Seated Exercise  extension SNAG x10 to tolerance      Neck Exercises: Supine   Neck Retraction  15 reps;3 secs    Neck Retraction Limitations  into towel roll    Other Supine Exercise  B horizontal abduction 2x10 with yellow TB    cues for increased control   Other Supine Exercise  B ER with yellow TB 2x10   cues for increased control     Electrical Stimulation   Electrical Stimulation Location  R UT    Electrical Stimulation Action  IFC    Electrical Stimulation Parameters  80-150 hz; output to tolerance; 10 min    Electrical Stimulation Goals  Pain;Tone      Manual Therapy   Manual Therapy  Soft tissue mobilization    Manual therapy comments  seated     Soft tissue mobilization  STM to R UT, LS, scalenes- palpable increased soft tissue restriction and TTP throughout      Neck Exercises: Stretches   Upper Trapezius Stretch  Right;Left;1 rep;30 seconds    Upper Trapezius Stretch Limitations  B - hand anchored on table     Levator Stretch  Right;Left;1 rep;30 seconds    Levator Stretch Limitations  B - hand anchored on table              PT Education - 06/25/19 0851    Education Details  update to HEP; administered yellow TB    Person(s) Educated  Patient    Methods  Explanation;Demonstration;Tactile cues;Verbal cues;Handout    Comprehension  Returned demonstration;Verbalized understanding       PT Short Term Goals - 06/25/19 0854      PT SHORT TERM GOAL #1   Title  Patient to be independent with intial HEP.    Time  3    Period  Weeks    Status  Achieved    Target Date  06/25/19        PT Long Term Goals - 06/11/19 1220      PT LONG TERM GOAL #1   Title  Patient to be independent with advanced HEP.    Time  8    Period  Weeks    Status   On-going      PT LONG TERM GOAL #2   Title  Patient to demonstrate cervical AROM WFL and without pain limiting.    Time  8    Period  Weeks    Status  On-going      PT LONG TERM GOAL #3   Title  Patient to demonstrate B shoulder strength >=4+/5.    Time  8    Period  Weeks    Status  On-going      PT LONG TERM GOAL #4   Title  Patient to demonstrate functional and symmetrical grip strength in B hands to improve ability to open jars.    Time  8    Period  Weeks    Status  On-going      PT LONG TERM GOAL #5   Title  Patient to report 80% improvement in driving tolerance d/t pain relief.    Time  8    Period  Weeks    Status  On-going            Plan - 06/25/19 8101    Clinical Impression Statement  Patient reporting increased neck pain over the weekend after packing up her office on Friday. Was given another prescription for Prednisone, which improved these symptoms. Began session with gentle cervical stretching and SNAGs to improve ROM. Patient demonstrating forward head posture at rest, which perpetuates during ther-ex. Cued patient to perform slight chin tuck with SNAG activities for better alignment. Worked on supine periscapular strengthening with cues for increased control. Updated HEP with these exercises as they were well-tolerated today- patient reported understanding. Patient demonstrated increased soft tissue restriction in R UT, LS, and scalenes, thus addressed with STM. Ended session with e-stim to R UT for pain relief. No complaints at end of session.    Comorbidities  HTN, anemia    Rehab Potential  Good    PT Treatment/Interventions  ADLs/Self Care Home Management;Cryotherapy;Electrical Stimulation;Moist Heat;Traction;Therapeutic exercise;Therapeutic activities;Functional mobility training;Ultrasound;Neuromuscular re-education;Patient/family education;Manual techniques;Taping;Energy conservation;Dry needling;Passive range of motion    PT Next Visit Plan  periscapular strengthening, cervical ROM    Consulted and Agree with Plan of Care  Patient       Patient will benefit from skilled therapeutic intervention in order to improve the following deficits and impairments:  Hypomobility, Decreased activity tolerance, Decreased strength, Pain, Impaired UE functional use, Increased muscle spasms, Improper body mechanics, Decreased range of motion, Impaired flexibility, Postural dysfunction  Visit Diagnosis: Cervicalgia  Muscle weakness (generalized)  Other muscle spasm  Abnormal posture     Problem List Patient Active Problem List   Diagnosis Date Noted  . Other spondylosis with radiculopathy, cervical region 03/20/2019  . Dizzy 09/30/2018  . History of scoliosis 09/30/2018  . Right hip pain 09/30/2018  . Allergies 09/30/2018  . Rash 03/25/2018  . Fever 03/25/2018  . Muscle spasm 03/25/2018  . HTN (hypertension) 07/20/2015  . Chronic cough 12/11/2012  . Idiopathic scoliosis 12/11/2012  . Anemia      Anette GuarneriYevgeniya Derec Mozingo, PT, DPT 06/25/19 9:04 AM   [facility] [address]  [address] [place], [place], [address] Phone: [phone]   Fax:  [phone]  Name: Nicole PartridgeLisa Blackburn MRN: [id number] Date of Birth: 02-May-1961

## 2019-07-02 ENCOUNTER — Other Ambulatory Visit: Payer: Self-pay

## 2019-07-02 ENCOUNTER — Ambulatory Visit: Payer: Managed Care, Other (non HMO)

## 2019-07-02 DIAGNOSIS — M62838 Other muscle spasm: Secondary | ICD-10-CM

## 2019-07-02 DIAGNOSIS — R293 Abnormal posture: Secondary | ICD-10-CM

## 2019-07-02 DIAGNOSIS — M542 Cervicalgia: Secondary | ICD-10-CM | POA: Diagnosis not present

## 2019-07-02 DIAGNOSIS — M6281 Muscle weakness (generalized): Secondary | ICD-10-CM

## 2019-07-02 NOTE — Therapy (Signed)
[facility] [address]  [place], [place], [address] Phone: [phone]   Fax:  [phone]  Physical Therapy Treatment  Patient Details  Name: Nicole Blackburn MRN: [id number] Date of Birth: 06-17-1961 Referring Provider (PT): Rodell Perna, MD   Encounter Date: 07/02/2019  PT End of Session - 07/02/19 0813    Visit Number  5    Number of Visits  9    Date for PT Re-Evaluation  07/30/19    Authorization Type  [employer]    PT Start Time  0802    PT Stop Time  0901    PT Time Calculation (min)  59 min    Activity Tolerance  Patient tolerated treatment well    Behavior During Therapy  [facility] for tasks assessed/performed       Past Medical History:  Diagnosis Date  . Abnormal pap 1997/1998   cryo  . Anemia   . Hypertension     Past Surgical History:  Procedure Laterality Date  . REFRACTIVE SURGERY Bilateral 2012   Dr Gershon Crane  . WISDOM TOOTH EXTRACTION  age 58's    There were no vitals filed for this visit.  Subjective Assessment - 07/02/19 0809    Subjective  Pt. reporting she has increased pain last weekend after packing up Office to move to a new building.  Unsure if recent pain relief is from therapy or new pack of prednisone.    Pertinent History  HTN, anemia    Diagnostic tests  MRI scan 05/03/2019 shows broad-based disc bulge with severe left foraminal stenosis and mild right foraminal stenosis.  Mild central stenosis.  C6-7 shows broad-based disc bulge with moderate left foraminal stenosis and mild right foraminal stenosis.    Patient Stated Goals  "i want the pain to go away"    Currently in Pain?  Yes    Pain Score  6     Pain Location  Neck    Pain Orientation  Right;Left    Pain Descriptors / Indicators  Stabbing    Pain Type  Chronic pain    Pain Radiating Towards  Pain radiating to anterior chest and into B shoulders from neck    Pain Onset  More than a month ago    Pain Frequency  Constant     Aggravating Factors   packing up the office.    Pain Relieving Factors  Prednisone         [facility] PT Assessment - 07/02/19 0001      AROM   AROM Assessment Site  Cervical    Cervical Flexion  27   mod pain increase   Cervical Extension  35   no change in pain    Cervical - Right Side Bend  18   mod pain   Cervical - Left Side Bend  16   pain moderate   Cervical - Right Rotation  40   min pain   Cervical - Left Rotation  47   min pain                  [facility] Adult PT Treatment/Exercise - 07/02/19 0001      Neck Exercises: Machines for Strengthening   UBE (Upper Arm Bike)  Lvl 1.5, 3 min forwards/3 min backwards     Cybex Row  10# - low row       Neck Exercises: Theraband   Shoulder Extension  10 reps   cues required for scapular  retraction    Shoulder Extension Limitations  yellow TB     Rows  15 reps   cues required for scapular depression   Rows Limitations  yellow TB      Moist Heat Therapy   Number Minutes Moist Heat  15 Minutes    Moist Heat Location  Shoulder   upper UT R     Electrical Stimulation   Electrical Stimulation Location  R UT    Electrical Stimulation Action  IFC    Electrical Stimulation Parameters  to tolerance, 15'    Electrical Stimulation Goals  Pain;Tone      Manual Therapy   Manual Therapy  Soft tissue mobilization;Myofascial release    Manual therapy comments  seated     Soft tissue mobilization  STM to R UT, LS, - palpalbe TPs in R UT    Myofascial Release  R TPR to R mid trap       Neck Exercises: Stretches   Upper Trapezius Stretch  Right;Left;30 seconds;2 reps    Upper Trapezius Stretch Limitations  B - hand anchored on table     Levator Stretch  Right;Left;30 seconds;2 reps    Levator Stretch Limitations  B - hand anchored on table     Other Neck Stretches  3-way scalenes stretch seated x 30 sec each way each side              PT Education - 07/02/19 0925    Education Details  HEP update; yellow TB row,  extension row    Person(s) Educated  Patient    Methods  Explanation;Demonstration;Verbal cues;Handout    Comprehension  Verbalized understanding;Returned demonstration;Verbal cues required       PT Short Term Goals - 06/25/19 0854      PT SHORT TERM GOAL #1   Title  Patient to be independent with intial HEP.    Time  3    Period  Weeks    Status  Achieved    Target Date  06/25/19        PT Long Term Goals - 07/02/19 6010      PT LONG TERM GOAL #1   Title  Patient to be independent with advanced HEP.    Time  8    Period  Weeks    Status  On-going      PT LONG TERM GOAL #2   Title  Patient to demonstrate cervical AROM WFL and without pain limiting.    Time  8    Period  Weeks    Status  On-going      PT LONG TERM GOAL #3   Title  Patient to demonstrate B shoulder strength >=4+/5.    Time  8    Period  Weeks    Status  On-going      PT LONG TERM GOAL #4   Title  Patient to demonstrate functional and symmetrical grip strength in B hands to improve ability to open jars.    Time  8    Period  Weeks    Status  On-going      PT LONG TERM GOAL #5   Title  Patient to report 80% improvement in driving tolerance d/t pain relief.    Time  8    Period  Weeks    Status  Partially Met   07/02/19:  Pt. noting 30% in pain with driving           Plan - 07/02/19 1003  Clinical Impression Statement  Pt. reporting ~ 30% relief of pain while driving with improved cervical rotation ROM when changing lanes however unsure if this is attributes to physical therapy or recent MD prescribed prednisone she has been taking this past week.  Was able to demo significant improvement in B cervical rotation and extension today.  Noting less pain with these cervical AROM movements today with testing.  Tolerated progression of periscapular strengthening activities well today.  MT addressing TP/increased muscular tension in R UT with TPR which responded well and STM. Ended visit with  E-stim/moist heat to cervical spine/upper shoulders for reduction in pain and tone.  Pt. purchased home TENS unit off [employer] and may require instruction in proper use in further visits.  Pt. has partially achieved LTG #5 and progressing toward LTG #3.    Personal Factors and Comorbidities  Age;Comorbidity 2;Time since onset of injury/illness/exacerbation;Fitness;Profession;Past/Current Experience    Comorbidities  HTN, anemia    Rehab Potential  Good    PT Treatment/Interventions  ADLs/Self Care Home Management;Cryotherapy;Electrical Stimulation;Moist Heat;Traction;Therapeutic exercise;Therapeutic activities;Functional mobility training;Ultrasound;Neuromuscular re-education;Patient/family education;Manual techniques;Taping;Energy conservation;Dry needling;Passive range of motion    PT Next Visit Plan  periscapular strengthening, cervical ROM    Consulted and Agree with Plan of Care  Patient       Patient will benefit from skilled therapeutic intervention in order to improve the following deficits and impairments:  Hypomobility, Decreased activity tolerance, Decreased strength, Pain, Impaired UE functional use, Increased muscle spasms, Improper body mechanics, Decreased range of motion, Impaired flexibility, Postural dysfunction  Visit Diagnosis: Cervicalgia  Muscle weakness (generalized)  Other muscle spasm  Abnormal posture     Problem List Patient Active Problem List   Diagnosis Date Noted  . Other spondylosis with radiculopathy, cervical region 03/20/2019  . Dizzy 09/30/2018  . History of scoliosis 09/30/2018  . Right hip pain 09/30/2018  . Allergies 09/30/2018  . Rash 03/25/2018  . Fever 03/25/2018  . Muscle spasm 03/25/2018  . HTN (hypertension) 07/20/2015  . Chronic cough 12/11/2012  . Idiopathic scoliosis 12/11/2012  . Anemia     Bess Harvest, PTA 07/02/19 10:12 AM   [facility] [address]  [place], [place], [address] Phone: [phone]   Fax:  [phone]  Name: Shirely Toren MRN: [id number] Date of Birth: Feb 09, 1961

## 2019-07-09 ENCOUNTER — Other Ambulatory Visit: Payer: Self-pay

## 2019-07-09 ENCOUNTER — Ambulatory Visit: Payer: Managed Care, Other (non HMO)

## 2019-07-09 DIAGNOSIS — M542 Cervicalgia: Secondary | ICD-10-CM | POA: Diagnosis not present

## 2019-07-09 DIAGNOSIS — R293 Abnormal posture: Secondary | ICD-10-CM

## 2019-07-09 DIAGNOSIS — M62838 Other muscle spasm: Secondary | ICD-10-CM

## 2019-07-09 DIAGNOSIS — M6281 Muscle weakness (generalized): Secondary | ICD-10-CM

## 2019-07-09 NOTE — Therapy (Signed)
[facility] [address]  [place], [place], [address] Phone: [phone]   Fax:  [phone]  Physical Therapy Treatment  Patient Details  Name: Nicole Blackburn MRN: [id number] Date of Birth: Aug 16, 1961 Referring Provider (PT): Rodell Perna, MD   Encounter Date: 07/09/2019  PT End of Session - 07/09/19 0806    Visit Number  6    Number of Visits  9    Date for PT Re-Evaluation  07/30/19    Authorization Type  [employer]    PT Start Time  0801    PT Stop Time  0900   Ended visit with 10 min moist heat   PT Time Calculation (min)  59 min    Activity Tolerance  Patient tolerated treatment well    Behavior During Therapy  [facility] for tasks assessed/performed       Past Medical History:  Diagnosis Date  . Abnormal pap 1997/1998   cryo  . Anemia   . Hypertension     Past Surgical History:  Procedure Laterality Date  . REFRACTIVE SURGERY Bilateral 2012   Dr Gershon Crane  . WISDOM TOOTH EXTRACTION  age 19's    There were no vitals filed for this visit.  Subjective Assessment - 07/09/19 0803    Subjective  Pt. noting she has not seen a large improvement in pain levels since last visit.  Took the last of her prednisone on Sunday.  Pt. noting improvement in ability to hold hand bags on shoulders since starting therapy.    Pertinent History  HTN, anemia    Diagnostic tests  MRI scan 05/03/2019 shows broad-based disc bulge with severe left foraminal stenosis and mild right foraminal stenosis.  Mild central stenosis.  C6-7 shows broad-based disc bulge with moderate left foraminal stenosis and mild right foraminal stenosis.    Patient Stated Goals  "i want the pain to go away"    Currently in Pain?  Yes    Pain Score  6     Pain Location  Neck    Pain Orientation  Right;Left    Pain Descriptors / Indicators  Stabbing;Aching    Pain Radiating Towards  Ache pain radiating to anterior chest and into B shoulders from neck    Pain Onset   More than a month ago    Pain Frequency  Constant    Aggravating Factors   Unsure    Pain Relieving Factors  Prednisone    Multiple Pain Sites  No         [facility] PT Assessment - 07/09/19 0001      Assessment   Medical Diagnosis  Other spondylosis with radiculopathy, cervical region    Referring Provider (PT)  Rodell Perna, MD    Onset Date/Surgical Date  01/11/19    Hand Dominance  Right    Next MD Visit  07/23/19      Strength   Right/Left Shoulder  Right;Left    Right Shoulder Flexion  4/5    Right Shoulder ABduction  4/5    Right Shoulder Internal Rotation  4/5    Right Shoulder External Rotation  4-/5    Left Shoulder Flexion  4/5    Left Shoulder ABduction  4/5    Left Shoulder Internal Rotation  4/5    Left Shoulder External Rotation  4-/5    Right Hand Grip (lbs)  27.33   25#, 28#, 29#   Left Hand Grip (lbs)  28   34#, 26#,  24#                  [facility] Adult PT Treatment/Exercise - 07/09/19 0001      Neck Exercises: Machines for Strengthening   UBE (Upper Arm Bike)  Lvl 2.0, 3 min forwards/3 min backwards     Cybex Row  10# x 15 reps        Neck Exercises: Theraband   Shoulder External Rotation  10 reps;Red    Shoulder External Rotation Limitations  Hooklying on pool noodle     Horizontal ABduction  10 reps;Red    Horizontal ABduction Limitations  Hooklying       Neck Exercises: Standing   Other Standing Exercises  wall pushups x 15 reps       Neck Exercises: Supine   Other Supine Exercise  B shoulder supine protraction 3# x 15 rpes       Neck Exercises: Prone   Neck Retraction  10 reps;5 secs    Neck Retraction Limitations  POE      Moist Heat Therapy   Number Minutes Moist Heat  10 Minutes    Moist Heat Location  Shoulder   B UT     Manual Therapy   Manual Therapy  Soft tissue mobilization;Myofascial release    Manual therapy comments  seated     Soft tissue mobilization  STM to R UT, LS, - palpalbe TPs in R UT    Myofascial Release  R  TPR to B mid trap       Neck Exercises: Stretches   Chest Stretch  1 rep;60 seconds    Chest Stretch Limitations  Hooklying on pool noodle                PT Short Term Goals - 06/25/19 0854      PT SHORT TERM GOAL #1   Title  Patient to be independent with intial HEP.    Time  3    Period  Weeks    Status  Achieved    Target Date  06/25/19        PT Long Term Goals - 07/09/19 0836      PT LONG TERM GOAL #1   Title  Patient to be independent with advanced HEP.    Time  8    Period  Weeks    Status  On-going      PT LONG TERM GOAL #2   Title  Patient to demonstrate cervical AROM WFL and without pain limiting.    Time  8    Period  Weeks    Status  On-going      PT LONG TERM GOAL #3   Title  Patient to demonstrate B shoulder strength >=4+/5.    Time  8    Period  Weeks    Status  Partially Met      PT LONG TERM GOAL #4   Title  Patient to demonstrate functional and symmetrical grip strength in B hands to improve ability to open jars.    Time  8    Period  Weeks    Status  Partially Met      PT LONG TERM GOAL #5   Title  Patient to report 80% improvement in driving tolerance d/t pain relief.    Time  8    Period  Weeks    Status  Partially Met   07/02/19:  Pt. noting 30% in pain with driving  Plan - 07/09/19 0810    Clinical Impression Statement  Nicole Blackburn reporting some improvement in comfort wearing purse on shoulder since starting therapy.  Pt. progressing toward LTG #3, #4 considering she was able to demo symmetrical R/L grip strength and improved overall shoulder strength with MMT however still 1/2-1 grade overall weakness in B shoulder musculature.  LTG #3, #4 partially achieved.  Session focused on progression of periscapular strengthening and anterior chest stretching which was tolerated well in hooklying on pool noodle on mat table.  Nicole Blackburn reports she is getting to home program "most days" and encouraged to continue daily adherence to all  HEP activities for full benefit from therapy.  MT addressing B UT TP and increased muscle tension with pt. very ttp however good tolerance for MT.  Ended visit with moist heat to B upper shoulder musculature for relaxation and reduction in pain.  Progressing well toward remaining LTGs.    Personal Factors and Comorbidities  Age;Comorbidity 2;Time since onset of injury/illness/exacerbation;Fitness;Profession;Past/Current Experience    Comorbidities  HTN, anemia    Rehab Potential  Good    PT Treatment/Interventions  ADLs/Self Care Home Management;Cryotherapy;Electrical Stimulation;Moist Heat;Traction;Therapeutic exercise;Therapeutic activities;Functional mobility training;Ultrasound;Neuromuscular re-education;Patient/family education;Manual techniques;Taping;Energy conservation;Dry needling;Passive range of motion    PT Next Visit Plan  periscapular strengthening, cervical ROM    Consulted and Agree with Plan of Care  Patient       Patient will benefit from skilled therapeutic intervention in order to improve the following deficits and impairments:  Hypomobility, Decreased activity tolerance, Decreased strength, Pain, Impaired UE functional use, Increased muscle spasms, Improper body mechanics, Decreased range of motion, Impaired flexibility, Postural dysfunction  Visit Diagnosis: Cervicalgia  Muscle weakness (generalized)  Other muscle spasm  Abnormal posture     Problem List Patient Active Problem List   Diagnosis Date Noted  . Other spondylosis with radiculopathy, cervical region 03/20/2019  . Dizzy 09/30/2018  . History of scoliosis 09/30/2018  . Right hip pain 09/30/2018  . Allergies 09/30/2018  . Rash 03/25/2018  . Fever 03/25/2018  . Muscle spasm 03/25/2018  . HTN (hypertension) 07/20/2015  . Chronic cough 12/11/2012  . Idiopathic scoliosis 12/11/2012  . Anemia     Bess Harvest, PTA 07/09/19 9:13 AM   [facility] [address]  [place], [place], [address] Phone: [phone]   Fax:  [phone]  Name: Nicole Blackburn MRN: [id number] Date of Birth: 08/05/1961

## 2019-07-16 ENCOUNTER — Other Ambulatory Visit: Payer: Self-pay | Admitting: Orthopedic Surgery

## 2019-07-16 ENCOUNTER — Ambulatory Visit: Payer: Managed Care, Other (non HMO) | Attending: Orthopaedic Surgery

## 2019-07-16 ENCOUNTER — Other Ambulatory Visit: Payer: Self-pay

## 2019-07-16 DIAGNOSIS — R293 Abnormal posture: Secondary | ICD-10-CM | POA: Insufficient documentation

## 2019-07-16 DIAGNOSIS — M542 Cervicalgia: Secondary | ICD-10-CM | POA: Diagnosis present

## 2019-07-16 DIAGNOSIS — M6281 Muscle weakness (generalized): Secondary | ICD-10-CM

## 2019-07-16 DIAGNOSIS — M62838 Other muscle spasm: Secondary | ICD-10-CM | POA: Diagnosis present

## 2019-07-16 NOTE — Therapy (Signed)
[facility] [address]  [place], [place], [address] Phone: [phone]   Fax:  [phone]  Physical Therapy Treatment  Patient Details  Name: Nicole Blackburn MRN: [id number] Date of Birth: 12/22/1960 Referring Provider (PT): Rodell Perna, MD   Encounter Date: 07/16/2019  PT End of Session - 07/16/19 0815    Visit Number  7    Number of Visits  9    Date for PT Re-Evaluation  07/30/19    Authorization Type  [employer]    PT Start Time  0807    PT Stop Time  0902    PT Time Calculation (min)  55 min    Activity Tolerance  Patient tolerated treatment well    Behavior During Therapy  [facility] for tasks assessed/performed       Past Medical History:  Diagnosis Date  . Abnormal pap 1997/1998   cryo  . Anemia   . Hypertension     Past Surgical History:  Procedure Laterality Date  . REFRACTIVE SURGERY Bilateral 58   Dr Gershon Crane  . WISDOM TOOTH EXTRACTION  age 58's    There were no vitals filed for this visit.  Subjective Assessment - 07/16/19 0813    Subjective  Pt. noting she has one day of relief after each therapy visit however pain returns to typical level following.    Pertinent History  HTN, anemia    Diagnostic tests  MRI scan 05/03/2019 shows broad-based disc bulge with severe left foraminal stenosis and mild right foraminal stenosis.  Mild central stenosis.  C6-7 shows broad-based disc bulge with moderate left foraminal stenosis and mild right foraminal stenosis.    Patient Stated Goals  "i want the pain to go away"    Currently in Pain?  Yes    Pain Score  5     Pain Location  Neck    Pain Orientation  Right;Left    Pain Descriptors / Indicators  Sore;Aching    Pain Type  Chronic pain    Pain Radiating Towards  Ache pain radiating to anterior chest and into B shoulders from neck    Pain Onset  More than a month ago    Pain Frequency  Constant    Aggravating Factors   a lot of house cleaning    Pain  Relieving Factors  prenisone    Multiple Pain Sites  No         [facility] PT Assessment - 07/16/19 0001      Assessment   Medical Diagnosis  Other spondylosis with radiculopathy, cervical region    Referring Provider (PT)  Rodell Perna, MD    Onset Date/Surgical Date  01/11/19    Hand Dominance  Right    Next MD Visit  07/23/19    Prior Therapy  yes- [facility]                   [facility] Adult PT Treatment/Exercise - 07/16/19 0001      Neck Exercises: Machines for Strengthening   UBE (Upper Arm Bike)  Lvl 2.0, 3 min forwards/3 min backwards     Cybex Row  15#  2 x 10 reps        Neck Exercises: Theraband   Shoulder Extension  15 reps   3" hold   Shoulder Extension Limitations  yellow TB     Rows  15 reps    Rows Limitations  yellow TB    Shoulder External Rotation  10 reps    Shoulder External Rotation Limitations  standing leaning on doorseal; yellow TB       Electrical Stimulation   Electrical Stimulation Location  B UT    Electrical Stimulation Action  Premod    Pt. noting improved pain reief with premod when questioned   Electrical Stimulation Parameters  to tolerance, 15'    Electrical Stimulation Goals  Pain;Tone      Manual Therapy   Manual Therapy  Soft tissue mobilization;Myofascial release    Manual therapy comments  seated     Soft tissue mobilization  STM to B UT, rhomboids, B shoulder complex  - TPs in B UT    Myofascial Release  B TPR to B UT        Neck Exercises: Stretches   Upper Trapezius Stretch  Right;Left;30 seconds;2 reps    Upper Trapezius Stretch Limitations  B - hand anchored on table     Levator Stretch  Right;Left;30 seconds;2 reps    Levator Stretch Limitations  B - hand anchored on table     Corner Stretch  3 reps;20 seconds   doorway chest stretch               PT Short Term Goals - 06/25/19 0854      PT SHORT TERM GOAL #1   Title  Patient to be independent with intial HEP.    Time  3    Period  Weeks    Status  Achieved     Target Date  06/25/19        PT Long Term Goals - 07/09/19 0836      PT LONG TERM GOAL #1   Title  Patient to be independent with advanced HEP.    Time  8    Period  Weeks    Status  On-going      PT LONG TERM GOAL #2   Title  Patient to demonstrate cervical AROM WFL and without pain limiting.    Time  8    Period  Weeks    Status  On-going      PT LONG TERM GOAL #3   Title  Patient to demonstrate B shoulder strength >=4+/5.    Time  8    Period  Weeks    Status  Partially Met      PT LONG TERM GOAL #4   Title  Patient to demonstrate functional and symmetrical grip strength in B hands to improve ability to open jars.    Time  8    Period  Weeks    Status  Partially Met      PT LONG TERM GOAL #5   Title  Patient to report 80% improvement in driving tolerance d/t pain relief.    Time  8    Period  Weeks    Status  Partially Met   07/02/19:  Pt. noting 30% in pain with driving           Plan - 07/16/19 0815    Clinical Impression Statement  Renlee reporting she has been having a full day of pain relief after each therapy session however pain returns to typical levels following therapy sessions next day.  Denies change in anterior chest/clavicular radicular pain since start of physical therapy.  Progressed periscapular/postural strengthening activities today without increased pain and MT addressing ongoing UT tightness/tenderness.  Pt. noting limited relief after MT today however does feel E-stim/moist heat resolves pain for remainder of day  thus ended with these modalities.  Pt. scheduled to see MD for f/u one week from now.  Will continue to progress toward goals.    Personal Factors and Comorbidities  Age;Comorbidity 2;Time since onset of injury/illness/exacerbation;Fitness;Profession;Past/Current Experience    Comorbidities  HTN, anemia    Rehab Potential  Good    PT Treatment/Interventions  ADLs/Self Care Home Management;Cryotherapy;Electrical Stimulation;Moist  Heat;Traction;Therapeutic exercise;Therapeutic activities;Functional mobility training;Ultrasound;Neuromuscular re-education;Patient/family education;Manual techniques;Taping;Energy conservation;Dry needling;Passive range of motion    PT Next Visit Plan  periscapular strengthening, cervical ROM    Consulted and Agree with Plan of Care  Patient       Patient will benefit from skilled therapeutic intervention in order to improve the following deficits and impairments:  Hypomobility, Decreased activity tolerance, Decreased strength, Pain, Impaired UE functional use, Increased muscle spasms, Improper body mechanics, Decreased range of motion, Impaired flexibility, Postural dysfunction  Visit Diagnosis: Cervicalgia  Muscle weakness (generalized)  Other muscle spasm  Abnormal posture     Problem List Patient Active Problem List   Diagnosis Date Noted  . Other spondylosis with radiculopathy, cervical region 03/20/2019  . Dizzy 09/30/2018  . History of scoliosis 09/30/2018  . Right hip pain 09/30/2018  . Allergies 09/30/2018  . Rash 03/25/2018  . Fever 03/25/2018  . Muscle spasm 03/25/2018  . HTN (hypertension) 07/20/2015  . Chronic cough 12/11/2012  . Idiopathic scoliosis 12/11/2012  . Anemia     Bess Harvest, PTA 07/16/19 10:18 AM    [facility] [address]  [place], [place], [address] Phone: [phone]   Fax:  [phone]  Name: Tanasia Budzinski MRN: [id number] Date of Birth: 04-Nov-1960

## 2019-07-23 ENCOUNTER — Ambulatory Visit: Payer: Managed Care, Other (non HMO)

## 2019-07-23 ENCOUNTER — Other Ambulatory Visit: Payer: Self-pay

## 2019-07-23 ENCOUNTER — Encounter: Payer: Self-pay | Admitting: Orthopaedic Surgery

## 2019-07-23 ENCOUNTER — Ambulatory Visit: Payer: Managed Care, Other (non HMO) | Admitting: Orthopaedic Surgery

## 2019-07-23 VITALS — BP 102/70 | HR 70 | Ht 59.0 in | Wt 107.0 lb

## 2019-07-23 DIAGNOSIS — M62838 Other muscle spasm: Secondary | ICD-10-CM

## 2019-07-23 DIAGNOSIS — M6281 Muscle weakness (generalized): Secondary | ICD-10-CM

## 2019-07-23 DIAGNOSIS — M4722 Other spondylosis with radiculopathy, cervical region: Secondary | ICD-10-CM

## 2019-07-23 DIAGNOSIS — M4802 Spinal stenosis, cervical region: Secondary | ICD-10-CM | POA: Diagnosis not present

## 2019-07-23 DIAGNOSIS — R293 Abnormal posture: Secondary | ICD-10-CM

## 2019-07-23 DIAGNOSIS — M542 Cervicalgia: Secondary | ICD-10-CM

## 2019-07-23 NOTE — Therapy (Addendum)
[facility] [address]  [place], [place], [address] Phone: [phone]   Fax:  [phone]  Physical Therapy Treatment  Patient Details  Name: Nicole Blackburn MRN: [id number] Date of Birth: July 28, 1961 Referring Provider (PT): Rodell Perna, MD   Encounter Date: 07/23/2019  PT End of Session - 07/23/19 0804    Visit Number  8    Number of Visits  9    Date for PT Re-Evaluation  07/30/19    Authorization Type  [employer]    PT Start Time  0800    PT Stop Time  0843    PT Time Calculation (min)  43 min    Activity Tolerance  Patient tolerated treatment well    Behavior During Therapy  [facility] for tasks assessed/performed       Past Medical History:  Diagnosis Date  . Abnormal pap 1997/1998   cryo  . Anemia   . Hypertension     Past Surgical History:  Procedure Laterality Date  . REFRACTIVE SURGERY Bilateral 2012   Dr Gershon Crane  . WISDOM TOOTH EXTRACTION  age 58's    There were no vitals filed for this visit.  Subjective Assessment - 07/23/19 0802    Subjective  Pt. reporting, "my pain is the same today".    Pertinent History  HTN, anemia    Diagnostic tests  MRI scan 05/03/2019 shows broad-based disc bulge with severe left foraminal stenosis and mild right foraminal stenosis.  Mild central stenosis.  C6-7 shows broad-based disc bulge with moderate left foraminal stenosis and mild right foraminal stenosis.    Patient Stated Goals  "i want the pain to go away"    Currently in Pain?  Yes    Pain Score  6     Pain Location  Neck    Pain Orientation  Right;Left    Pain Descriptors / Indicators  Sore;Aching    Pain Type  Chronic pain    Pain Radiating Towards  ache pain radiating to anterior chest and into B shoulders from neck    Pain Onset  More than a month ago    Pain Frequency  Constant    Multiple Pain Sites  No         [facility] PT Assessment - 07/23/19 0001      Assessment   Medical Diagnosis  Other  spondylosis with radiculopathy, cervical region    Referring Provider (PT)  Rodell Perna, MD    Onset Date/Surgical Date  01/11/19    Hand Dominance  Right    Next MD Visit  07/23/19    Prior Therapy  yes- [facility]      AROM   AROM Assessment Site  Cervical    Cervical Flexion  15   slight pain increase   Cervical Extension  30    Cervical - Right Side Bend  15   pt. noting pain up to 3/10 pain    Cervical - Left Side Bend  20   5/10 pain at end range    Cervical - Right Rotation  34   neck pain up to 5/10 at end range    Cervical - Left Rotation  42   pain up to 5/10 at end range      Strength   Right/Left Shoulder  Right;Left    Right Shoulder Flexion  4/5    Right Shoulder ABduction  4/5    Right Shoulder Internal Rotation  4/5    Right  Shoulder External Rotation  4/5    Left Shoulder Flexion  4/5    Left Shoulder ABduction  4/5    Left Shoulder Internal Rotation  4-/5    Left Shoulder External Rotation  4-/5    Right Hand Grip (lbs)  15.67    Left Hand Grip (lbs)  14                   [facility] Adult PT Treatment/Exercise - 07/23/19 0001      Self-Care   Self-Care  Other Self-Care Comments    Other Self-Care Comments   Verbally reviewed comprehensive HEP to check for need for update and appropriateness; pt. denied need for review as she reports she consistently performs daily - did not update HEP as pt. reports she is still challanged with existing activities       Neck Exercises: Machines for Strengthening   UBE (Upper Arm Bike)  Lvl 2.0, 3 min forwards/3 min backwards       Neck Exercises: Seated   Money  15 reps    Money Limitations  yellow TB     Other Seated Exercise  B shoulder horizontal abduction with yellow TB x 15 reps    Performed in low ROM pulling across lower ribs      Neck Exercises: Stretches   Upper Trapezius Stretch  Right;Left;30 seconds;2 reps    Upper Trapezius Stretch Limitations  B - hand anchored on table     Levator Stretch   Right;Left;30 seconds;2 reps    Levator Stretch Limitations  B - hand anchored on table                PT Short Term Goals - 06/25/19 0854      PT SHORT TERM GOAL #1   Title  Patient to be independent with intial HEP.    Time  3    Period  Weeks    Status  Achieved    Target Date  06/25/19        PT Long Term Goals - 07/23/19 0810      PT LONG TERM GOAL #1   Title  Patient to be independent with advanced HEP.    Time  8    Period  Weeks    Status  Achieved      PT LONG TERM GOAL #2   Title  Patient to demonstrate cervical AROM WFL and without pain limiting.    Time  8    Period  Weeks    Status  On-going   07/23/19: improvement in B cervical rotation however not yet WFL in all cervical AROM and with pain up to 5/10 at end ranges of all motions     PT LONG TERM GOAL #3   Title  Patient to demonstrate B shoulder strength >=4+/5.    Time  8    Period  Weeks    Status  On-going   07/23/19: grossly 4/5 B shoulder strength with MMT     PT LONG TERM GOAL #4   Title  Patient to demonstrate functional and symmetrical grip strength in B hands to improve ability to open jars.    Time  8    Period  Weeks    Status  On-going   07/23/19:  Pt. symmetrical grip strength however limited functional grip strength B ~ 15-20lb strength     PT LONG TERM GOAL #5   Title  Patient to report 80% improvement in driving tolerance d/t  pain relief.    Time  8    Period  Weeks    Status  Partially Met   07/23/19: pt. noting ~ 20% improvement in driving tolerance d/t pain           Plan - 07/23/19 0804    Clinical Impression Statement  Pt. has made slow progress with therapy however is demonstrating improved cervical AROM since initial evaluation.  Pt. noting a few days relief after therapy sessions last week which she states is typical.  She notes her neck pain and radiating pain into B anterior chest returns next day after session and feels frequency and intensity of this pain  has not changed significantly since start of physical therapy. Pt. has achieved LTG #1 reporting understanding and ongoing performance daily of HEP.  LTG #2 for cervical AROM goal still ongoing as pt. demonstrating improvement in B cervical rotation however limited progress with B side bending, flexion/extension AROM with pain at end range up to 5/10 with nearly all cervical motions.  LTG #3 ongoing as pt. grossly 4/5 strength at B shoulders demonstrating -1 grade strength deficit with MMT.  LTG #4 ongoing as pt. demonstrating remaining B grip strength weakness averaging 14-16lb bilaterally.  LTG #5 ongoing as pt. only noting 20% improvement in pain level while driving with tolerance still limited.  Given pt. report of limited progress with physical therapy she wishes to go on 30-day hold from therapy until after seeing MD later today for f/u.  Supervising Pt approving this plan and final HEP reviewed with pt. to check for appropriateness.  Pt. now on 30-day hold from therapy.    Comorbidities  HTN, anemia    Rehab Potential  Good    PT Treatment/Interventions  ADLs/Self Care Home Management;Cryotherapy;Electrical Stimulation;Moist Heat;Traction;Therapeutic exercise;Therapeutic activities;Functional mobility training;Ultrasound;Neuromuscular re-education;Patient/family education;Manual techniques;Taping;Energy conservation;Dry needling;Passive range of motion    PT Next Visit Plan  on 30-day hold from therapy    Consulted and Agree with Plan of Care  Patient       Patient will benefit from skilled therapeutic intervention in order to improve the following deficits and impairments:  Hypomobility, Decreased activity tolerance, Decreased strength, Pain, Impaired UE functional use, Increased muscle spasms, Improper body mechanics, Decreased range of motion, Impaired flexibility, Postural dysfunction  Visit Diagnosis: Cervicalgia  Muscle weakness (generalized)  Other muscle spasm  Abnormal  posture     Problem List Patient Active Problem List   Diagnosis Date Noted  . Other spondylosis with radiculopathy, cervical region 03/20/2019  . Dizzy 09/30/2018  . History of scoliosis 09/30/2018  . Right hip pain 09/30/2018  . Allergies 09/30/2018  . Rash 03/25/2018  . Fever 03/25/2018  . Muscle spasm 03/25/2018  . HTN (hypertension) 07/20/2015  . Chronic cough 12/11/2012  . Idiopathic scoliosis 12/11/2012  . Anemia     Bess Harvest, PTA 07/23/19 9:22 AM   [facility] [address]  [place], [place], [address] Phone: [phone]   Fax:  [phone]  Name: Ferol Laiche MRN: [id number] Date of Birth: [birth year]  PHYSICAL THERAPY DISCHARGE SUMMARY  Visits from Start of Care: 8  Current functional level related to goals / functional outcomes: See above clinical impression; patient did not return after 30 day hold    Remaining deficits: Decreased cervical ROM, decreased strength, limited functional activity tolerance   Education / Equipment: HEP  Plan: Patient agrees to discharge.  Patient goals were partially met. Patient is being discharged due  to lack of progress.  ?????     Janene Harvey, PT, DPT 09/03/19 11:58 AM

## 2019-07-23 NOTE — Progress Notes (Signed)
Office Visit Note   Patient: Nicole PartridgeLisa Blackburn           Date of Birth: 11/27/1960           MRN: [id number] Visit Date: 07/23/2019              Requested by: [address]Lowne Chase, [place] R, [place] [id number] [address] [place],  [place] [id number] PCP: Zola ButtonLowne Chase, Grayling CongressYvonne R, DO   Assessment & Plan: Visit Diagnoses:  1. Other spondylosis with radiculopathy, cervical region   2. Foraminal stenosis of cervical region     Plan: Patient with persistent symptoms cervical spine with severe foraminal stenosis C5-6 on the left.  She has 1 mm anterolisthesis at C6-7 broad-based disc bulge with paracentral disc protrusion at C6-7.  She has been through physical therapy continues to have to take prednisone in order for her to be able to work.  She has had muscle relaxants physical therapy x6 weeks, home stretching exercises.  Plan to be two-level cervical fusion C5-6 C6-7 overnight stay in the hospital soft collar x6 weeks.  Risks of pseudoarthrosis dysphagia, dysphonia, slight risks reoperation if one of the levels did not heal successfully and was symptomatic.  Questions elicited and answered she requested to proceed.  Follow-Up Instructions: Preop for two-level cervical fusion C5-6, C6-7.  Orders:  No orders of the defined types were placed in this encounter.  No orders of the defined types were placed in this encounter.     Procedures: No procedures performed   Clinical Data: No additional findings.   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  MRI scan was 05/03/2019 and initially she was scheduled for surgery this was delayed for insurance request for physical therapy.  She has been on anti-inflammatories muscle relaxant and is still taking prednisone intermittently she had to take 2  tablets yesterday.  It bothers her when she works with pain radiating from neck and arm problems with driving she has difficulty sleeping.  No gait disturbance and no falling.  Review of Systems 14 point is system possible lumbar scoliosis history of cervical spondylosis severe foraminal stenosis as above.  Possible hypertension mild anemia.  Otherwise 14 point systems negative is a pertains HPI.   Objective: Vital Signs: BP 102/70    Pulse 70    Ht 4\' 11"  (1.499 m)    Wt 107 lb (48.5 kg)    LMP 02/24/2015    BMI 21.61 kg/m   Physical Exam Constitutional:      Appearance: She is well-developed.  HENT:     Head: Normocephalic.     Right Ear: External ear normal.     Left Ear: External ear normal.  Eyes:     Pupils: Pupils are equal, round, and reactive to light.  Neck:     Thyroid: No thyromegaly.     Trachea: No tracheal deviation.  Cardiovascular:     Rate and Rhythm: Normal rate.  Pulmonary:     Effort: Pulmonary effort is normal.  Abdominal:     Palpations: Abdomen is soft.  Skin:    General: Skin is warm and dry.  Neurological:     Mental Status: She is alert and oriented to person, place, and time.  Psychiatric:        Behavior: Behavior normal.  Ortho Exam patient has 75% cervical flexion with sharp pain posteriorly.  Tenderness of brachial plexus left more than right.  Upper extremity reflexes are 2+ and symmetrical.  Decreased sensation radial side left hand thumb index webspace.  No thenar atrophy.  Interossei are strong.  Positive Spurling left greater than right.  Specialty Comments:  No specialty comments available.  Imaging: EXAM: MRI CERVICAL SPINE WITHOUT CONTRAST  TECHNIQUE: Multiplanar, multisequence MR imaging of the cervical spine was performed. No intravenous contrast was administered.  COMPARISON: None.  FINDINGS: Alignment: Physiologic.  Vertebrae: No fracture, evidence of discitis, or bone lesion.  Cord: Normal signal and  morphology.  Posterior Fossa, vertebral arteries, paraspinal tissues: Posterior fossa demonstrates no focal abnormality. Vertebral artery flow voids are maintained. Paraspinal soft tissues are unremarkable.  Disc levels:  Discs: Degenerative disc disease with disc height loss at C5-6 and C6-7.  C2-3: No significant disc bulge. No neural foraminal stenosis. No central canal stenosis.  C3-4: Broad-based disc bulge. Mild bilateral facet arthropathy. Mild left foraminal stenosis. No central canal stenosis.  C4-5: No significant disc bulge. Mild left facet arthropathy. No foraminal stenosis. No central canal stenosis.  C5-6: Broad-based disc bulge with a broad central disc protrusion impressing on the thecal sac. Left uncovertebral degenerative changes. Severe left foraminal stenosis. Mild right foraminal stenosis. Mild central canal stenosis.  C6-7: Broad-based disc bulge with a small left paracentral disc protrusion. Bilateral uncovertebral degenerative changes. Mild right and moderate left foraminal stenosis. No central canal stenosis.  C7-T1: No significant disc bulge. No neural foraminal stenosis. No central canal stenosis.  IMPRESSION: 1. Cervical spine spondylosis as described above. 2. At C5-6 there is a broad-based disc bulge with a broad central disc protrusion impressing on the thecal sac. Left uncovertebral degenerative changes. Severe left foraminal stenosis. Mild right foraminal stenosis. Mild central canal stenosis. 3. At C6-7 there is a broad-based disc bulge with a small left paracentral disc protrusion. Bilateral uncovertebral degenerative changes. Mild right and moderate left foraminal stenosis.   Electronically Signed By: Kathreen Devoid On: 05/03/2019 12:21  Cervical spine AP lateral x-ray on 03/20/2019 showed spondylosis changes C5-6 C6-7 with 1 mm anterolisthesis at C6-7.  Uncovertebral changes and facet arthropathy left greater than  right.   PMFS History: Patient Active Problem List   Diagnosis Date Noted   Foraminal stenosis of cervical region 07/23/2019   Other spondylosis with radiculopathy, cervical region 03/20/2019   Dizzy 09/30/2018   History of scoliosis 09/30/2018   Right hip pain 09/30/2018   Allergies 09/30/2018   Rash 03/25/2018   Fever 03/25/2018   Muscle spasm 03/25/2018   HTN (hypertension) 07/20/2015   Chronic cough 12/11/2012   Idiopathic scoliosis 12/11/2012   Anemia    Past Medical History:  Diagnosis Date   Abnormal pap 1997/1998   cryo   Anemia    Hypertension     Family History  Problem Relation Age of Onset   COPD Mother    Osteoporosis Mother    Pulmonary embolism Mother    Diabetes Father    Hypertension Father    Colon polyps Father    Hypertension Sister    Hypertension Brother    Stroke Maternal Grandmother 19   Cancer Paternal Grandmother 12       multiple myloma   Cancer Paternal Grandfather        ? kind cancer    Past Surgical History:  Procedure Laterality Date   REFRACTIVE SURGERY Bilateral 2012   Dr Bradley Ferris  TOOTH EXTRACTION  age 8's   Social History   Occupational History   Occupation: [employer]  Tobacco Use   Smoking status: Never Smoker   Smokeless tobacco: Never Used  Substance and Sexual Activity   Alcohol use: Yes    Alcohol/week: 3.0 standard drinks    Types: 3 Standard drinks or equivalent per week    Comment: Socially   Drug use: No   Sexual activity: Yes    Partners: Male    Birth control/protection: Post-menopausal

## 2019-08-27 ENCOUNTER — Other Ambulatory Visit: Payer: Self-pay | Admitting: Family Medicine

## 2019-08-27 NOTE — Telephone Encounter (Signed)
Needs app this month or next

## 2019-08-27 NOTE — Telephone Encounter (Signed)
Dr Carollee Herter -- Just refilled fenofibrate. Pt last seen 03/05/19 for acute visit and has no future appts scheduled. When should pt return for follow up.

## 2019-08-29 ENCOUNTER — Encounter: Payer: Self-pay | Admitting: Surgery

## 2019-08-29 ENCOUNTER — Ambulatory Visit: Payer: Managed Care, Other (non HMO) | Admitting: Surgery

## 2019-08-29 ENCOUNTER — Other Ambulatory Visit: Payer: Self-pay

## 2019-08-29 VITALS — BP 132/79 | HR 76 | Ht 59.0 in | Wt 108.0 lb

## 2019-08-29 DIAGNOSIS — M4722 Other spondylosis with radiculopathy, cervical region: Secondary | ICD-10-CM

## 2019-08-29 NOTE — Progress Notes (Signed)
58 year old female with history of C5-6 and C6-7 HNP/stenosis comes in for preop evaluation.  States that neck pain and upper extremity radiculopathy is unchanged from previous visit.  She is wanting to proceed with C5-6 and C6-7 ACDF as scheduled.  Today history and physical performed and will be placed in patient's chart.  Surgical procedure discussed in detail along with potential recovery time.  All questions answered.

## 2019-09-04 NOTE — Progress Notes (Signed)
Your procedure is scheduled on Monday, December 28th, from 12:30 PM to 3:02 PM.  Report to [facility] Main Entrance "A" at 10:30 A.M., and check in at the Admitting office.  Call this number if you have problems the morning of surgery:  [phone].   Remember:  Do not eat after midnight the night before your surgery.  You may drink clear liquids until 09:30 AM the morning of your surgery.    Clear liquids allowed are: Water, Non-Citrus Juices (without pulp), Carbonated Beverages, Clear Tea, Black Coffee Only, and Gatorade.  Please complete your PRE-SURGERY ENSURE that was provided to you by 09:30 AM the morning of surgery.  Please, if able, drink it in one setting. DO NOT SIP.    Take these medicines the morning of surgery with A SIP OF WATER : fenofibrate  IF NEEDED: acetaminophen (TYLENOL) predniSONE (DELTASONE)  As of today, STOP taking any Aspirin (unless otherwise instructed by your surgeon), Aleve, Naproxen, Ibuprofen, Motrin, Advil, Goody's, BC's, all herbal medications, fish oil, and all vitamins.    The Morning of Surgery  Do not wear jewelry, make-up or nail polish.  Do not wear lotions, powders, perfumes/, or deodorant.  Do not shave 48 hours prior to surgery.   Do not bring valuables to the hospital.  [facility] is not responsible for any belongings or valuables.  If you are a smoker, DO NOT Smoke 24 hours prior to surgery.  If you wear a CPAP at night please bring your mask, tubing, and machine the morning of surgery.  Remember that you must have someone to transport you home after your surgery, and remain with you for 24 hours if you are discharged the same day.   Please bring cases for contacts, glasses, hearing aids, dentures or bridgework because it cannot be worn into surgery.    Leave your suitcase in the car.  After surgery it may be brought to your room.  For patients admitted to the hospital, discharge time will be determined by your treatment  team.  Patients discharged the day of surgery will not be allowed to drive home.    Special instructions:   [facility]- Preparing For Surgery  Before surgery, you can play an important role. Because skin is not sterile, your skin needs to be as free of germs as possible. You can reduce the number of germs on your skin by washing with CHG (chlorahexidine gluconate) Soap before surgery.  CHG is an antiseptic cleaner which kills germs and bonds with the skin to continue killing germs even after washing.    Oral Hygiene is also important to reduce your risk of infection.  Remember - BRUSH YOUR TEETH THE MORNING OF SURGERY WITH YOUR REGULAR TOOTHPASTE  Please do not use if you have an allergy to CHG or antibacterial soaps. If your skin becomes reddened/irritated stop using the CHG.  Do not shave (including legs and underarms) for at least 48 hours prior to first CHG shower. It is OK to shave your face.  Please follow these instructions carefully.   1. Shower the NIGHT BEFORE SURGERY and the MORNING OF SURGERY with CHG Soap.   2. If you chose to wash your hair, wash your hair first as usual with your normal shampoo.  3. After you shampoo, rinse your hair and body thoroughly to remove the shampoo.  4. Use CHG as you would any other liquid soap. You can apply CHG directly to the skin and wash gently with a scrungie  or a clean washcloth.   5. Apply the CHG Soap to your body ONLY FROM THE NECK DOWN.  Do not use on open wounds or open sores. Avoid contact with your eyes, ears, mouth and genitals (private parts). Wash Face and genitals (private parts)  with your normal soap.   6. Wash thoroughly, paying special attention to the area where your surgery will be performed.  7. Thoroughly rinse your body with warm water from the neck down.  8. DO NOT shower/wash with your normal soap after using and rinsing off the CHG Soap.  9. Pat yourself dry with a CLEAN TOWEL.  10. Wear CLEAN PAJAMAS to bed  the night before surgery, wear comfortable clothes the morning of surgery.  11. Place CLEAN SHEETS on your bed the night of your first shower and DO NOT SLEEP WITH PETS.    Day of Surgery:  Remember to brush your teeth WITH YOUR REGULAR TOOTHPASTE. Please shower the morning of surgery with the CHG soap. Do not apply any deodorants/lotions. Please wear clean clothes to the hospital/surgery center.      Please read over the following fact sheets that you were given.

## 2019-09-05 ENCOUNTER — Ambulatory Visit (HOSPITAL_COMMUNITY)
Admission: RE | Admit: 2019-09-05 | Discharge: 2019-09-05 | Disposition: A | Discharge status: Home / Self Care | Payer: Managed Care, Other (non HMO) | Source: Ambulatory Visit | Attending: Surgery | Admitting: Surgery

## 2019-09-05 ENCOUNTER — Other Ambulatory Visit: Payer: Self-pay

## 2019-09-05 ENCOUNTER — Encounter (HOSPITAL_COMMUNITY)
Admission: RE | Admit: 2019-09-05 | Discharge: 2019-09-05 | Disposition: A | Discharge status: Home / Self Care | Payer: Managed Care, Other (non HMO) | Source: Ambulatory Visit | Attending: Orthopaedic Surgery | Admitting: Orthopaedic Surgery

## 2019-09-05 ENCOUNTER — Other Ambulatory Visit (HOSPITAL_COMMUNITY)
Admission: RE | Admit: 2019-09-05 | Discharge: 2019-09-05 | Disposition: A | Discharge status: Home / Self Care | Payer: Managed Care, Other (non HMO) | Source: Ambulatory Visit | Attending: Orthopaedic Surgery | Admitting: Orthopaedic Surgery

## 2019-09-05 ENCOUNTER — Encounter (HOSPITAL_COMMUNITY): Payer: Self-pay

## 2019-09-05 DIAGNOSIS — Z20828 Contact with and (suspected) exposure to other viral communicable diseases: Secondary | ICD-10-CM | POA: Insufficient documentation

## 2019-09-05 DIAGNOSIS — Z01818 Encounter for other preprocedural examination: Secondary | ICD-10-CM

## 2019-09-05 LAB — COMPREHENSIVE METABOLIC PANEL
ALT: 16 U/L (ref 0–44)
AST: 21 U/L (ref 15–41)
Albumin: 4.1 g/dL (ref 3.5–5.0)
Alkaline Phosphatase: 52 U/L (ref 38–126)
Anion gap: 14 (ref 5–15)
BUN: 18 mg/dL (ref 6–20)
CO2: 26 mmol/L (ref 22–32)
Calcium: 9.7 mg/dL (ref 8.9–10.3)
Chloride: 102 mmol/L (ref 98–111)
Creatinine, Ser: 1.07 mg/dL — ABNORMAL HIGH (ref 0.44–1.00)
GFR calc Af Amer: >60 mL/min (ref >60)
GFR calc non Af Amer: 57 mL/min — ABNORMAL LOW (ref >60)
Glucose, Bld: 95 mg/dL (ref 70–99)
Potassium: 3.7 mmol/L (ref 3.5–5.1)
Sodium: 142 mmol/L (ref 135–145)
Total Bilirubin: 0.8 mg/dL (ref 0.3–1.2)
Total Protein: 7.3 g/dL (ref 6.5–8.1)

## 2019-09-05 LAB — CBC
HCT: 41.4 % (ref 36.0–46.0)
Hemoglobin: 13.1 g/dL (ref 12.0–15.0)
MCH: 30.4 pg (ref 26.0–34.0)
MCHC: 31.6 g/dL (ref 30.0–36.0)
MCV: 96.1 fL (ref 80.0–100.0)
Platelets: 310 10*3/uL (ref 150–400)
RBC: 4.31 MIL/uL (ref 3.87–5.11)
RDW: 12.6 % (ref 11.5–15.5)
WBC: 7.8 10*3/uL (ref 4.0–10.5)
nRBC: 0 % (ref 0.0–0.2)

## 2019-09-05 LAB — SURGICAL PCR SCREEN
MRSA, PCR: NEGATIVE
Staphylococcus aureus: POSITIVE — AB

## 2019-09-05 MED ORDER — CEFAZOLIN SODIUM-DEXTROSE 2-4 GM/100ML-% IV SOLN: 2.0000 g | INTRAVENOUS | Status: DC

## 2019-09-05 NOTE — Progress Notes (Signed)
PCP:  Dr. Roma Schanz Cardiologist:  Denies  EKG:  09/05/19 CXR:  09/05/19 ECHO:   denies Stress Test:  denies Cardiac Cath:  denies  Covid test 09/05/19  Patient denies shortness of breath, fever, cough, and chest pain at PAT appointment.  Patient verbalized understanding of instructions provided today at the PAT appointment.  Patient asked to review instructions at home and day of surgery.

## 2019-09-06 LAB — NOVEL CORONAVIRUS, NAA (HOSP ORDER, SEND-OUT TO REF LAB; TAT 18-24 HRS): SARS-CoV-2, NAA: NOT DETECTED

## 2019-09-09 ENCOUNTER — Encounter (HOSPITAL_COMMUNITY): Payer: Self-pay | Admitting: Certified Registered"

## 2019-09-09 ENCOUNTER — Ambulatory Visit (HOSPITAL_COMMUNITY)
Admission: RE | Admit: 2019-09-09 | Payer: Managed Care, Other (non HMO) | Source: Home / Self Care | Admitting: Orthopaedic Surgery

## 2019-09-09 ENCOUNTER — Encounter (HOSPITAL_COMMUNITY): Admission: RE | Payer: Self-pay | Source: Home / Self Care

## 2019-09-09 SURGERY — ANTERIOR CERVICAL DECOMPRESSION/DISCECTOMY FUSION 2 LEVELS
Anesthesia: General

## 2019-09-09 NOTE — Anesthesia Preprocedure Evaluation (Deleted)
Anesthesia Evaluation    Reviewed: Allergy & Precautions, Patient's Chart, lab work & pertinent test results  History of Anesthesia Complications Negative for: history of anesthetic complications  Airway        Dental   Pulmonary neg pulmonary ROS,           Cardiovascular hypertension,      Neuro/Psych negative psych ROS   GI/Hepatic negative GI ROS, Neg liver ROS,   Endo/Other  negative endocrine ROS  Renal/GU negative Renal ROS     Musculoskeletal negative musculoskeletal ROS (+)   Abdominal   Peds  Hematology  (+) anemia ,   Anesthesia Other Findings Covid neg 12/24  Reproductive/Obstetrics                             Anesthesia Physical Anesthesia Plan  ASA: II  Anesthesia Plan: General   Post-op Pain Management:    Induction: Intravenous  PONV Risk Score and Plan: 4 or greater and Treatment may vary due to age or medical condition, Ondansetron, Midazolam and Dexamethasone  Airway Management Planned: Oral ETT and Video Laryngoscope Planned  Additional Equipment: None  Intra-op Plan:   Post-operative Plan: Extubation in OR  Informed Consent:   Plan Discussed with: CRNA and Anesthesiologist  Anesthesia Plan Comments:         Anesthesia Quick Evaluation

## 2019-09-09 NOTE — H&P (Signed)
Nicole Blackburn is an 58 y.o. female.   Chief Complaint: neck pain and UE radiculopathy HPI:  58 year old female with history of C5-6 and C6-7 HNP/stenosis comes in for preop evaluation.  States that neck pain and upper extremity radiculopathy is unchanged from previous visit.  She is wanting to proceed with C5-6 and C6-7 ACDF as scheduled.  Past Medical History:  Diagnosis Date  . Abnormal pap 1997/1998   cryo  . Anemia   . Hypertension     Past Surgical History:  Procedure Laterality Date  . REFRACTIVE SURGERY Bilateral 2012   Dr Gershon Crane  . WISDOM TOOTH EXTRACTION  age 11's    Family History  Problem Relation Age of Onset  . COPD Mother   . Osteoporosis Mother   . Pulmonary embolism Mother   . Diabetes Father   . Hypertension Father   . Colon polyps Father   . Hypertension Sister   . Hypertension Brother   . Stroke Maternal Grandmother 60  . Cancer Paternal Grandmother 38       multiple myloma  . Cancer Paternal Grandfather        ? kind cancer   Social History:  reports that she has never smoked. She has never used smokeless tobacco. She reports current alcohol use of about 3.0 standard drinks of alcohol per week. She reports that she does not use drugs.  Allergies: No Known Allergies  No medications prior to admission.    No results found for this or any previous visit (from the past 48 hour(s)). No results found.  Review of Systems  Constitutional: Negative.   HENT: Negative.   Respiratory: Negative.   Cardiovascular: Negative.   Genitourinary: Negative.   Musculoskeletal: Positive for neck pain and neck stiffness.  Neurological: Positive for numbness.  Psychiatric/Behavioral: Negative.     Last menstrual period 02/24/2015. Physical Exam  Constitutional: She appears well-developed. No distress.  HENT:  Head: Normocephalic and atraumatic.  Eyes: Pupils are equal, round, and reactive to light. EOM are normal.  Cardiovascular: Normal heart sounds.   Respiratory: Effort normal. No respiratory distress.  GI: Soft. She exhibits no distension. There is no abdominal tenderness.  Musculoskeletal:        General: Tenderness present.  Neurological: She is alert.  Skin: Skin is warm and dry.  Psychiatric: She has a normal mood and affect.     Assessment/Plan C5-6 and C6-7 HNP/stenosis  We will proceed with C5-C7 ACDF as scheduled. Surgical procedure along with possible recovery time discussed.  All questions answered and wishes to proceed.   Benjiman Core, PA-C 09/09/2019, 7:21 AM

## 2019-09-17 ENCOUNTER — Inpatient Hospital Stay: Payer: Managed Care, Other (non HMO) | Admitting: Orthopaedic Surgery

## 2019-10-07 ENCOUNTER — Encounter: Payer: Self-pay | Admitting: Orthopaedic Surgery

## 2019-10-18 ENCOUNTER — Ambulatory Visit: Payer: Managed Care, Other (non HMO) | Admitting: Orthopaedic Surgery

## 2019-10-18 ENCOUNTER — Other Ambulatory Visit: Payer: Self-pay

## 2019-10-18 ENCOUNTER — Encounter: Payer: Self-pay | Admitting: Orthopaedic Surgery

## 2019-10-18 VITALS — BP 113/77 | HR 86 | Ht 59.0 in | Wt 110.0 lb

## 2019-10-18 DIAGNOSIS — M4722 Other spondylosis with radiculopathy, cervical region: Secondary | ICD-10-CM | POA: Diagnosis not present

## 2019-10-18 DIAGNOSIS — M4802 Spinal stenosis, cervical region: Secondary | ICD-10-CM

## 2019-10-18 NOTE — Progress Notes (Signed)
Office Visit Note   Patient: Nicole Blackburn           Date of Birth: 01-07-1961           MRN: [id number] Visit Date: 10/18/2019              Requested by: [address] [place], [place] [address] [place],  [place] [address] PCP: Zola Button, Grayling Congress, DO   Assessment & Plan: Visit Diagnoses:  1. Foraminal stenosis of cervical region   2. Other spondylosis with radiculopathy, cervical region     Plan: Patient requests we resubmit request for surgery to her insurance company.  This bothers her on a daily basis she has had several Medrol Dosepaks and is not gotten any relief with conservative treatment including physical therapy muscle relaxants, anti-inflammatories and pain medication.  It bothers her on a daily basis.  She works as an Airline pilot and uses a Animator and states her neck is painful when she works and she is concerned about making errors while working due to her neck pain.  Today we again discussed risks of dysphagia, dysphonia, cervical pseudoarthrosis risks.  Questions elicited and answered she requests we proceed.  Follow-Up Instructions: No follow-ups on file.   Orders:  No orders of the defined types were placed in this encounter.  No orders of the defined types were placed in this encounter.     Procedures: No procedures performed   Clinical Data: No additional findings.   Subjective: Chief Complaint  Patient presents with  . Neck - Pain    HPI 59 year old female returns with persistent neck pain rated at severe and left greater than right arm pain.  Patient works as an Airline pilot and states she is having trouble sleeping.  She has been doing physical therapy, anti-inflammatories, prednisone Dosepaks, Tylenol, topical rubs without relief.  She has problems when she drives turning her neck.  Patient denies any myelopathic gait problems.  We have discussed anterior cervical two-level fusion at C5-6 and C6-7 for her disc protrusion at C6-7 with left  foraminal disc protrusion and severe foraminal stenosis on the left at both levels.  Patient was scheduled for surgery September 2020 initially denied requesting physical therapy she went to physical therapy was resubmitted after 6 weeks of therapy patient had persistent symptoms and is requesting that we proceed with surgery.  I have discussed the procedure with her several times at this point.  Patient brought with her a letter from her insurance company [employer] that explained to the patient that she needed to resubmit an appeal for the authorization as it was past the 60-day limit for reconsideration and needed to be resubmitted.  Review of Systems positive for history of a scoliosis not symptomatic.  History of anemia hypertension.  Chronic cervical spondylosis symptomatic with severe left foraminal stenosis and paracentral disc protrusion broad-based disc bulge at C6-7.  Sleep disturbance related to neck and arm pain.   Objective: Vital Signs: BP 113/77   Pulse 86   Ht 4\' 11"  (1.499 m)   Wt 110 lb (49.9 kg)   LMP 02/24/2015   BMI 22.22 kg/m   Physical Exam Constitutional:      Appearance: She is well-developed.  HENT:     Head: Normocephalic.     Right Ear: External ear normal.     Left Ear: External ear normal.  Eyes:     Pupils: Pupils are equal, round, and reactive to light.  Neck:  Thyroid: No thyromegaly.     Trachea: No tracheal deviation.  Cardiovascular:     Rate and Rhythm: Normal rate.  Pulmonary:     Effort: Pulmonary effort is normal.  Abdominal:     Palpations: Abdomen is soft.  Skin:    General: Skin is warm and dry.  Neurological:     Mental Status: She is alert and oriented to person, place, and time.  Psychiatric:        Behavior: Behavior normal.     Ortho Exam patient has positive brachial plexus tenderness worse on the left than right.  Positive Spurling on the left mild positive on the right.  Upper extremity reflexes are 2+ and symmetrical  decreased sensation C6 distribution on the left hand normal on the right no thenar or hypothenar atrophy.  Carpal tunnel exam is negative.  Normal heel toe gait no lower extremity clonus or hyperreflexia.  Specialty Comments:  No specialty comments available.  Imaging: CLINICAL DATA:  Neck pain radiating into the collar bones.  EXAM: MRI CERVICAL SPINE WITHOUT CONTRAST  TECHNIQUE: Multiplanar, multisequence MR imaging of the cervical spine was performed. No intravenous contrast was administered.  COMPARISON:  None.  FINDINGS: Alignment: Physiologic.  Vertebrae: No fracture, evidence of discitis, or bone lesion.  Cord: Normal signal and morphology.  Posterior Fossa, vertebral arteries, paraspinal tissues: Posterior fossa demonstrates no focal abnormality. Vertebral artery flow voids are maintained. Paraspinal soft tissues are unremarkable.  Disc levels:  Discs: Degenerative disc disease with disc height loss at C5-6 and C6-7.  C2-3: No significant disc bulge. No neural foraminal stenosis. No central canal stenosis.  C3-4: Broad-based disc bulge. Mild bilateral facet arthropathy. Mild left foraminal stenosis. No central canal stenosis.  C4-5: No significant disc bulge. Mild left facet arthropathy. No foraminal stenosis. No central canal stenosis.  C5-6: Broad-based disc bulge with a broad central disc protrusion impressing on the thecal sac. Left uncovertebral degenerative changes. Severe left foraminal stenosis. Mild right foraminal stenosis. Mild central canal stenosis.  C6-7: Broad-based disc bulge with a small left paracentral disc protrusion. Bilateral uncovertebral degenerative changes. Mild right and moderate left foraminal stenosis. No central canal stenosis.  C7-T1: No significant disc bulge. No neural foraminal stenosis. No central canal stenosis.  IMPRESSION: 1. Cervical spine spondylosis as described above. 2. At C5-6 there is a  broad-based disc bulge with a broad central disc protrusion impressing on the thecal sac. Left uncovertebral degenerative changes. Severe left foraminal stenosis. Mild right foraminal stenosis. Mild central canal stenosis. 3. At C6-7 there is a broad-based disc bulge with a small left paracentral disc protrusion. Bilateral uncovertebral degenerative changes. Mild right and moderate left foraminal stenosis.   Electronically Signed   By: Elige Ko   On: 05/03/2019 12:21   PMFS History: Patient Active Problem List   Diagnosis Date Noted  . Foraminal stenosis of cervical region 07/23/2019  . Other spondylosis with radiculopathy, cervical region 03/20/2019  . Dizzy 09/30/2018  . History of scoliosis 09/30/2018  . Right hip pain 09/30/2018  . Allergies 09/30/2018  . Rash 03/25/2018  . Fever 03/25/2018  . Muscle spasm 03/25/2018  . HTN (hypertension) 07/20/2015  . Chronic cough 12/11/2012  . Idiopathic scoliosis 12/11/2012  . Anemia    Past Medical History:  Diagnosis Date  . Abnormal pap 1997/1998   cryo  . Anemia   . Hypertension     Family History  Problem Relation Age of Onset  . COPD Mother   . Osteoporosis Mother   .  Pulmonary embolism Mother   . Diabetes Father   . Hypertension Father   . Colon polyps Father   . Hypertension Sister   . Hypertension Brother   . Stroke Maternal Grandmother 60  . Cancer Paternal Grandmother 13       multiple myloma  . Cancer Paternal Grandfather        ? kind cancer    Past Surgical History:  Procedure Laterality Date  . REFRACTIVE SURGERY Bilateral 2012   Dr Gershon Crane  . WISDOM TOOTH EXTRACTION  age 23's   Social History   Occupational History  . Occupation: fresh Counselling psychologist  Tobacco Use  . Smoking status: Never Smoker  . Smokeless tobacco: Never Used  Substance and Sexual Activity  . Alcohol use: Yes    Alcohol/week: 3.0 standard drinks    Types: 3 Standard drinks or equivalent per week     Comment: Socially  . Drug use: No  . Sexual activity: Yes    Partners: Male    Birth control/protection: Post-menopausal

## 2019-10-22 ENCOUNTER — Encounter: Payer: Managed Care, Other (non HMO) | Admitting: Family Medicine

## 2019-10-28 ENCOUNTER — Encounter: Payer: Self-pay | Admitting: Orthopaedic Surgery

## 2019-11-06 ENCOUNTER — Telehealth: Payer: Self-pay

## 2019-11-06 NOTE — Telephone Encounter (Signed)
You can give them my cell phone # to call . [phone] that will be easier.

## 2019-11-06 NOTE — Telephone Encounter (Signed)
noted 

## 2019-11-06 NOTE — Telephone Encounter (Signed)
Re: surgery precert  Codes 11155, 22845, and 20931 approved. Code 20802 denied stating "records do no show your exam finding correlate with C6/C7 level being considered for the fusion." All office notes, imaging, and PT notes were sent.   P2P was set up for Friday 2/26 @ 4:30 (this was the only time available for a time where Dr. Ophelia Charter was not in surgery for this week) with Dr. Sofie Rower. I gave them the triage # to call as the primary # and my # as secondary.

## 2019-11-15 ENCOUNTER — Other Ambulatory Visit: Payer: Self-pay

## 2019-11-21 ENCOUNTER — Ambulatory Visit: Payer: Managed Care, Other (non HMO) | Admitting: Obstetrics and Gynecology

## 2019-11-25 ENCOUNTER — Other Ambulatory Visit: Payer: Self-pay

## 2019-11-25 ENCOUNTER — Telehealth: Payer: Self-pay

## 2019-11-25 NOTE — Telephone Encounter (Signed)
Called patient. Left VM to schedule pre-op appointment. Advised patient to call back to schedule.

## 2019-11-26 ENCOUNTER — Ambulatory Visit: Payer: Managed Care, Other (non HMO) | Admitting: Family Medicine

## 2019-11-26 ENCOUNTER — Encounter: Payer: Self-pay | Admitting: Family Medicine

## 2019-11-26 VITALS — BP 124/78 | HR 85 | Temp 97.7°F | Resp 18 | Ht 59.0 in | Wt 111.8 lb

## 2019-11-26 DIAGNOSIS — Z01818 Encounter for other preprocedural examination: Secondary | ICD-10-CM

## 2019-11-26 NOTE — Pre-Procedure Instructions (Signed)
Nicole Blackburn  11/26/2019   Your procedure is scheduled on Wednesday, March 24 th..  Report to [facility], Main Entrance or Entrance "A" at 10:30 AM                          Your surgery or procedure is scheduled for 12:30 PM    Call this number if you have problems the morning of surgery: [phone]  This is the number for the Pre- Surgical Desk.                 For any other questions, please call [phone], Monday - Friday 8 AM - 4 PM.   Remember:  Do not eat after midnight Tuesday, March 23.   You may drink clear liquids until 9:30 AM    Clear liquids allowed are:    Water, Juice (non-citric and without pulp), Carbonated beverages, Clear Tea, Black Coffee only, Plain Jell-O only, Gatorade and Plain Popsicles only                 Please drink the Pre- Surgery Ensure between 9:00 AM and 9:30 AM    Take these medicines the morning of surgery with A SIP OF WATER:  loratadine (CLARITIN)             If needed you may take:acetaminophen (TYLENOL)  Special instructions:   [facility]- Preparing For Surgery  Before surgery, you can play an important role. Because skin is not sterile, your skin needs to be as free of germs as possible. You can reduce the number of germs on your skin by washing with CHG (chlorahexidine gluconate) Soap before surgery.  CHG is an antiseptic cleaner which kills germs and bonds with the skin to continue killing germs even after washing.    Oral Hygiene is also important to reduce your risk of infection.  Remember - BRUSH YOUR TEETH THE MORNING OF SURGERY WITH YOUR REGULAR TOOTHPASTE  Please do not use if you have an allergy to CHG or antibacterial soaps. If your skin becomes reddened/irritated stop using the CHG.  Do not shave (including legs and underarms) for at least 48 hours prior to first CHG shower. It is OK to shave your face.  Please follow these instructions carefully.   1. Shower the NIGHT BEFORE SURGERY and the MORNING OF SURGERY  with CHG.   2. If you chose to wash your hair, wash your hair first as usual with your normal shampoo.  3. After you shampoo,wash your face and private area with the soap you use at home, then rinse your hair and body thoroughly to remove the shampoo and soap. .  4. Use CHG as you would any other liquid soap. You can apply CHG directly to the skin and wash gently with a scrungie or a clean washcloth.   5. Apply the CHG Soap to your body ONLY FROM THE NECK DOWN.  Do not use on open wounds or open sores. Avoid contact with your eyes, ears, mouth and genitals (private parts).   6. Wash thoroughly, paying special attention to the area where your surgery will be performed.  7. Thoroughly rinse your body with warm water from the neck down.  8. DO NOT shower/wash with your normal soap after using and rinsing off the CHG Soap.  9. Pat yourself dry with a CLEAN TOWEL.  10. Wear CLEAN PAJAMAS to bed the night before surgery, wear comfortable clothes the  morning of surgery  11. Place CLEAN SHEETS on your bed the night of your first shower and DO NOT SLEEP WITH PETS.  Day of Surgery: Shower as instructed above. Do not apply any deodorants/lotions, powders or colognes..  Please wear clean clothes to the hospital/surgery center.   Remember to brush your teeth WITH YOUR REGULAR TOOTHPASTE.  Do not wear jewelry, make-up or nail polish.  Do not shave 48 hours prior to surgery.   Do not bring valuables to the hospital.  [facility] is not responsible for any belongings or valuables.  Contacts, dentures or bridgework may not be worn into surgery.  Leave your suitcase in the car.  After surgery it may be brought to your room.  For patients admitted to the hospital, discharge time will be determined by your treatment team.  Patients discharged the day of surgery will not be allowed to drive home.   Please read over the following fact sheets that you were given.

## 2019-11-26 NOTE — Progress Notes (Signed)
Nicole Blackburn is a 59 y.o. female who presents to the office today for a preoperative consultation at the request of surgeon Dr Lorin Mercy  who plans on performing 2 level cervical fusion on March 24. This consultation is requested for the specific conditions prompting preoperative evaluation (i.e. because of potential affect on operative risk): age  Planned anesthesia: general. The patient has the following known anesthesia issues: no problems . Patients bleeding risk: no recent abnormal bleeding.  The following portions of the patient's history were reviewed and updated as appropriate:  She  has a past medical history of Abnormal pap (1997/1998), Anemia, and Hypertension. She does not have any pertinent problems on file. She  has a past surgical history that includes Refractive surgery (Bilateral, 2012) and Wisdom tooth extraction (age 76's). Her family history includes COPD in her mother; Cancer in her paternal grandfather; Cancer (age of onset: 12) in her paternal grandmother; Colon polyps in her father; Diabetes in her father; Hypertension in her brother, father, and sister; Osteoporosis in her mother; Pulmonary embolism in her mother; Stroke (age of onset: 59) in her maternal grandmother. She  reports that she has never smoked. She has never used smokeless tobacco. She reports current alcohol use of about 3.0 standard drinks of alcohol per week. She reports that she does not use drugs. She has a current medication list which includes the following prescription(s): acetaminophen, bismuth subsalicylate, calcium carbonate-vit d-min, loratadine, multiple vitamin, psyllium, and fenofibrate. Current Outpatient Medications on File Prior to Visit  Medication Sig Dispense Refill  . acetaminophen (TYLENOL) 500 MG tablet Take 1,000 mg by mouth every 6 (six) hours as needed for moderate pain or headache.    . bismuth subsalicylate (PEPTO BISMOL) 262 MG/15ML suspension Take 30 mLs by mouth every 6 (six) hours as  needed for indigestion or diarrhea or loose stools.    . Calcium Carbonate-Vit D-Min (CALCIUM 1200 PO) Take 1 tablet by mouth 3 (three) times a week.     . loratadine (CLARITIN) 10 MG tablet Take 10 mg by mouth daily.    . Multiple Vitamins-Minerals (MULTIVITAMIN PO) Take 1 tablet by mouth daily.     . psyllium (METAMUCIL) 0.52 G capsule Take 0.52 g by mouth daily.    . fenofibrate 160 MG tablet TAKE 1 TABLET (160 MG TOTAL) BY MOUTH DAILY. *NEEDS OFFICE VISIT* (Patient not taking: No sig reported) 90 tablet 0   No current facility-administered medications on file prior to visit.   She has No Known Allergies..  Review of Systems Review of Systems  Constitutional: Negative for activity change, appetite change and fatigue.  HENT: Negative for hearing loss, congestion, tinnitus and ear discharge.  dentist q60m Eyes: Negative for visual disturbance (see optho q1y -- vision corrected to 20/20 with glasses).  Respiratory: Negative for cough, chest tightness and shortness of breath.   Cardiovascular: Negative for chest pain, palpitations and leg swelling.  Gastrointestinal: Negative for abdominal pain, diarrhea, constipation and abdominal distention.  Genitourinary: Negative for urgency, frequency, decreased urine volume and difficulty urinating.  Musculoskeletal: Negative for back pain, arthralgias and gait problem. + pain in arms and neck  Skin: Negative for color change, pallor and rash.  Neurological: Negative for dizziness, light-headedness, numbness and headaches.  Hematological: Negative for adenopathy. Does not bruise/bleed easily.  Psychiatric/Behavioral: Negative for suicidal ideas, confusion, sleep disturbance, self-injury, dysphoric mood, decreased concentration and agitation.        Objective:    BP 124/78 (BP Location: Right Arm, Patient Position:  Sitting, Cuff Size: Normal)   Pulse 85   Temp 97.7 F (36.5 C) (Temporal)   Resp 18   Ht 4\' 11"  (1.499 m)   Wt 111 lb 12.8 oz  (50.7 kg)   LMP 02/24/2015   SpO2 100%   BMI 22.58 kg/m  General appearance: alert, cooperative, appears stated age and no distress Head: Normocephalic, without obvious abnormality, atraumatic Eyes: conjunctivae/corneas clear. PERRL, EOM's intact. Fundi benign. Ears: normal TM's and external ear canals both ears Neck: no adenopathy, no carotid bruit, no JVD, supple, symmetrical, trachea midline and thyroid not enlarged, symmetric, no tenderness/mass/nodules Back: symmetric, no curvature. ROM normal. No CVA tenderness. Lungs: clear to auscultation bilaterally Heart: regular rate and rhythm, S1, S2 normal, no murmur, click, rub or gallop Abdomen: soft, non-tender; bowel sounds normal; no masses,  no organomegaly Extremities: extremities normal, atraumatic, no cyanosis or edema Pulses: 2+ and symmetric Skin: Skin color, texture, turgor normal. No rashes or lesions Lymph nodes: Cervical, supraclavicular, and axillary nodes normal. Neurologic: Alert and oriented X 3, normal strength and tone. Normal symmetric reflexes. Normal coordination and gait   Cardiographics ECG: normal sinus rhythm, no blocks or conduction defects, no ischemic changes Echocardiogram: not done  Imaging Chest x-ray: normal   Lab Review  Hospital Outpatient Visit on 09/05/2019  Component Date Value  . SARS-CoV-2, NAA 09/05/2019 NOT DETECTED   . Coronavirus Source 09/05/2019 NASOPHARYNGEAL   Hospital Outpatient Visit on 09/05/2019  Component Date Value  . MRSA, PCR 09/05/2019 NEGATIVE   . Staphylococcus aureus 09/05/2019 POSITIVE*  . WBC 09/05/2019 7.8   . RBC 09/05/2019 4.31   . Hemoglobin 09/05/2019 13.1   . HCT 09/05/2019 41.4   . MCV 09/05/2019 96.1   . [facility] 09/05/2019 30.4   . MCHC 09/05/2019 31.6   . RDW 09/05/2019 12.6   . Platelets 09/05/2019 310   . nRBC 09/05/2019 0.0   . Sodium 09/05/2019 142   . Potassium 09/05/2019 3.7   . Chloride 09/05/2019 102   . CO2 09/05/2019 26   . Glucose, Bld  09/05/2019 95   . BUN 09/05/2019 18   . Creatinine, Ser 09/05/2019 1.07*  . Calcium 09/05/2019 9.7   . Total Protein 09/05/2019 7.3   . Albumin 09/05/2019 4.1   . AST 09/05/2019 21   . ALT 09/05/2019 16   . Alkaline Phosphatase 09/05/2019 52   . Total Bilirubin 09/05/2019 0.8   . GFR calc non Af Amer 09/05/2019 57*  . GFR calc Af Amer 09/05/2019 >60   . Anion gap 09/05/2019 14       Assessment:      59 y.o. female with planned surgery as above.   Known risk factors for perioperative complications: None   Difficulty with intubation is not anticipated.  Cardiac Risk Estimation: low  Current medications which may produce withdrawal symptoms if withheld perioperatively: none     Plan:    1. Preoperative workup as follows ECG. 2. Change in medication regimen before surgery: per surgical team. 3. Prophylaxis for cardiac events with perioperative beta-blockers: not indicated. 4. Invasive hemodynamic monitoring perioperatively: not indicated. 5. Deep vein thrombosis prophylaxis postoperatively:regimen to be chosen by surgical team. 7. Other measures: [facility] if needed

## 2019-11-27 ENCOUNTER — Other Ambulatory Visit: Payer: Self-pay

## 2019-11-27 ENCOUNTER — Encounter (HOSPITAL_COMMUNITY)
Admission: RE | Admit: 2019-11-27 | Discharge: 2019-11-27 | Disposition: A | Discharge status: Home / Self Care | Payer: Managed Care, Other (non HMO) | Source: Ambulatory Visit | Attending: Orthopaedic Surgery | Admitting: Orthopaedic Surgery

## 2019-11-27 ENCOUNTER — Ambulatory Visit: Payer: Managed Care, Other (non HMO) | Admitting: Surgery

## 2019-11-27 ENCOUNTER — Encounter (HOSPITAL_COMMUNITY): Payer: Self-pay

## 2019-11-27 ENCOUNTER — Encounter: Payer: Self-pay | Admitting: Surgery

## 2019-11-27 VITALS — BP 127/78 | HR 78 | Temp 97.5°F

## 2019-11-27 DIAGNOSIS — Z01812 Encounter for preprocedural laboratory examination: Secondary | ICD-10-CM | POA: Insufficient documentation

## 2019-11-27 DIAGNOSIS — M4802 Spinal stenosis, cervical region: Secondary | ICD-10-CM

## 2019-11-27 HISTORY — DX: Hyperlipidemia, unspecified: E78.5

## 2019-11-27 LAB — CBC
HCT: 37.7 % (ref 36.0–46.0)
Hemoglobin: 12.1 g/dL (ref 12.0–15.0)
MCH: 31.5 pg (ref 26.0–34.0)
MCHC: 32.1 g/dL (ref 30.0–36.0)
MCV: 98.2 fL (ref 80.0–100.0)
Platelets: 381 10*3/uL (ref 150–400)
RBC: 3.84 MIL/uL — ABNORMAL LOW (ref 3.87–5.11)
RDW: 13.6 % (ref 11.5–15.5)
WBC: 6.1 10*3/uL (ref 4.0–10.5)
nRBC: 0 % (ref 0.0–0.2)

## 2019-11-27 LAB — SURGICAL PCR SCREEN
MRSA, PCR: NEGATIVE
Staphylococcus aureus: POSITIVE — AB

## 2019-11-27 NOTE — Pre-Procedure Instructions (Signed)
Nicole Blackburn  11/27/2019   Your procedure is scheduled on Wednesday, March 24 th..  Report to [facility], Main Entrance or Entrance "A" at 10:30 AM                          Your surgery or procedure is scheduled for 12:30 PM    Call this number if you have problems the morning of surgery: [phone]  This is the number for the Pre- Surgical Desk.                 For any other questions, please call [phone], Monday - Friday 8 AM - 4 PM.   Remember:  Do not eat after midnight Tuesday, March 23.   You may drink clear liquids until 9:30 AM    Clear liquids allowed are:    Water, Juice (non-citric and without pulp), Carbonated beverages, Clear Tea, Black Coffee only, Plain Jell-O only, Gatorade and Plain Popsicles only                 Please drink the Pre- Surgery Ensure between 9:00 AM and 9:30 AM    Take these medicines the morning of surgery with A SIP OF WATER:  loratadine (CLARITIN)             If needed you may take:acetaminophen (TYLENOL)  7 days prior to surgery STOP taking any Aspirin (unless otherwise instructed by your surgeon), Aleve, Naproxen, Ibuprofen, Motrin, Advil, Goody's, BC's, all herbal medications, fish oil, and all vitamins.  Special instructions:   [facility]- Preparing For Surgery  Before surgery, you can play an important role. Because skin is not sterile, your skin needs to be as free of germs as possible. You can reduce the number of germs on your skin by washing with CHG (chlorahexidine gluconate) Soap before surgery.  CHG is an antiseptic cleaner which kills germs and bonds with the skin to continue killing germs even after washing.    Oral Hygiene is also important to reduce your risk of infection.  Remember - BRUSH YOUR TEETH THE MORNING OF SURGERY WITH YOUR REGULAR TOOTHPASTE  Please do not use if you have an allergy to CHG or antibacterial soaps. If your skin becomes reddened/irritated stop using the CHG.  Do not shave (including legs and  underarms) for at least 48 hours prior to first CHG shower. It is OK to shave your face.  Please follow these instructions carefully.   1. Shower the NIGHT BEFORE SURGERY Tues and the MORNING OF SURGERY Wed with CHG.   2. If you chose to wash your hair, wash your hair first as usual with your normal shampoo.  3. After you shampoo,wash your face and private area with the soap you use at home, then rinse your hair and body thoroughly to remove the shampoo and soap. .  4. Use CHG as you would any other liquid soap. You can apply CHG directly to the skin and wash gently with a scrungie or a clean washcloth.   5. Apply the CHG Soap to your body ONLY FROM THE NECK DOWN.  Do not use on open wounds or open sores. Avoid contact with your eyes, ears, mouth and genitals (private parts).   6. Wash thoroughly, paying special attention to the area where your surgery will be performed.  7. Thoroughly rinse your body with warm water from the neck down.  8. DO NOT shower/wash with your normal soap after  using and rinsing off the CHG Soap.  9. Pat yourself dry with a CLEAN TOWEL.  10. Wear CLEAN PAJAMAS to bed the night before surgery, wear comfortable clothes the morning of surgery  11. Place CLEAN SHEETS on your bed the night of your first shower and DO NOT SLEEP WITH PETS.  Day of Surgery: Shower as instructed above. Do not apply any deodorants/lotions, powders or colognes..  Please wear clean clothes to the hospital/surgery center.   Remember to brush your teeth WITH YOUR REGULAR TOOTHPASTE.  Do not wear jewelry, make-up or nail polish.  Do not shave 48 hours prior to surgery.   Do not bring valuables to the hospital.  [facility] is not responsible for any belongings or valuables.  Contacts, dentures or bridgework may not be worn into surgery.  Leave your suitcase in the car.  After surgery it may be brought to your room.  For patients admitted to the hospital, discharge time will be  determined by your treatment team.  Patients discharged the day of surgery will not be allowed to drive home.   Please read over the following fact sheets that you were given.

## 2019-11-27 NOTE — Progress Notes (Signed)
Patient denies shortness of breath, fever, cough and chest pain.  PCP - Dr Loreen Freud Cardiologist - deneis  Chest x-ray - 09/05/19 EKG - 11/26/19 Stress Test - denies ECHO - denies Cardiac Cath - denies  ERAS: Clears til 9:30 am DOS, Ensure pre-surgery drink given at PAT appt.  Anesthesia review: No  Coronavirus Screening Covid test scheduled on Sat., 11/30/19 Have you experienced the following symptoms:  Cough yes/no: No Fever (>100.26F)  yes/no: No Runny nose yes/no: No Sore throat yes/no: No Difficulty breathing/shortness of breath  yes/no: No  Have you traveled in the last 14 days and where? yes/no: No  Patient verbalized understanding of instructions that were given to them at the PAT appointment.

## 2019-11-27 NOTE — Progress Notes (Signed)
59 year old white female with history of C5-6 and C6-7 HNP/stenosis comes in for preop evaluation.  States that symptoms unchanged and she continues to have neck pain with bilateral upper extremity radiculopathy.  She is want to proceed with C5-6 and C6-7 ACDF as scheduled.  Today history and physical performed.  Review of systems negative.  All questions answered.

## 2019-11-30 ENCOUNTER — Other Ambulatory Visit (HOSPITAL_COMMUNITY): Payer: Managed Care, Other (non HMO)

## 2019-12-02 ENCOUNTER — Encounter: Payer: Self-pay | Admitting: Orthopaedic Surgery

## 2019-12-03 ENCOUNTER — Other Ambulatory Visit (HOSPITAL_COMMUNITY)
Admission: RE | Admit: 2019-12-03 | Discharge: 2019-12-03 | Disposition: A | Discharge status: Home / Self Care | Payer: Managed Care, Other (non HMO) | Source: Ambulatory Visit | Attending: Orthopaedic Surgery | Admitting: Orthopaedic Surgery

## 2019-12-03 DIAGNOSIS — Z20822 Contact with and (suspected) exposure to covid-19: Secondary | ICD-10-CM | POA: Insufficient documentation

## 2019-12-03 DIAGNOSIS — Z01812 Encounter for preprocedural laboratory examination: Secondary | ICD-10-CM | POA: Insufficient documentation

## 2019-12-03 LAB — SARS CORONAVIRUS 2 (TAT 6-24 HRS): SARS Coronavirus 2: NEGATIVE

## 2019-12-04 ENCOUNTER — Ambulatory Visit (HOSPITAL_COMMUNITY): Payer: Managed Care, Other (non HMO) | Admitting: Certified Registered Nurse Anesthetist

## 2019-12-04 ENCOUNTER — Encounter (HOSPITAL_COMMUNITY): Payer: Self-pay | Admitting: Orthopaedic Surgery

## 2019-12-04 ENCOUNTER — Observation Stay (HOSPITAL_COMMUNITY)
Admission: RE | Admit: 2019-12-04 | Discharge: 2019-12-05 | Disposition: A | Discharge status: Home / Self Care | Payer: Managed Care, Other (non HMO) | Attending: Orthopaedic Surgery | Admitting: Orthopaedic Surgery

## 2019-12-04 ENCOUNTER — Encounter (HOSPITAL_COMMUNITY)
Admission: RE | Disposition: A | Discharge status: Home / Self Care | Payer: Self-pay | Source: Home / Self Care | Attending: Orthopaedic Surgery

## 2019-12-04 ENCOUNTER — Other Ambulatory Visit: Payer: Self-pay

## 2019-12-04 ENCOUNTER — Ambulatory Visit (HOSPITAL_COMMUNITY): Payer: Managed Care, Other (non HMO)

## 2019-12-04 DIAGNOSIS — M4802 Spinal stenosis, cervical region: Secondary | ICD-10-CM | POA: Diagnosis not present

## 2019-12-04 DIAGNOSIS — D649 Anemia, unspecified: Secondary | ICD-10-CM | POA: Diagnosis not present

## 2019-12-04 DIAGNOSIS — Z419 Encounter for procedure for purposes other than remedying health state, unspecified: Secondary | ICD-10-CM

## 2019-12-04 DIAGNOSIS — Z8371 Family history of colonic polyps: Secondary | ICD-10-CM | POA: Insufficient documentation

## 2019-12-04 DIAGNOSIS — I10 Essential (primary) hypertension: Secondary | ICD-10-CM | POA: Diagnosis not present

## 2019-12-04 DIAGNOSIS — Z809 Family history of malignant neoplasm, unspecified: Secondary | ICD-10-CM | POA: Diagnosis not present

## 2019-12-04 DIAGNOSIS — Z823 Family history of stroke: Secondary | ICD-10-CM | POA: Insufficient documentation

## 2019-12-04 DIAGNOSIS — Z8249 Family history of ischemic heart disease and other diseases of the circulatory system: Secondary | ICD-10-CM | POA: Diagnosis not present

## 2019-12-04 DIAGNOSIS — Z79899 Other long term (current) drug therapy: Secondary | ICD-10-CM | POA: Insufficient documentation

## 2019-12-04 DIAGNOSIS — M4722 Other spondylosis with radiculopathy, cervical region: Principal | ICD-10-CM | POA: Insufficient documentation

## 2019-12-04 DIAGNOSIS — Z8262 Family history of osteoporosis: Secondary | ICD-10-CM | POA: Diagnosis not present

## 2019-12-04 DIAGNOSIS — E785 Hyperlipidemia, unspecified: Secondary | ICD-10-CM | POA: Diagnosis not present

## 2019-12-04 DIAGNOSIS — Z825 Family history of asthma and other chronic lower respiratory diseases: Secondary | ICD-10-CM | POA: Diagnosis not present

## 2019-12-04 DIAGNOSIS — Z833 Family history of diabetes mellitus: Secondary | ICD-10-CM | POA: Diagnosis not present

## 2019-12-04 HISTORY — PX: ANTERIOR CERVICAL DECOMP/DISCECTOMY FUSION: SHX1161

## 2019-12-04 SURGERY — ANTERIOR CERVICAL DECOMPRESSION/DISCECTOMY FUSION 2 LEVELS
Anesthesia: General | Site: Spine Cervical

## 2019-12-04 MED ORDER — ACETAMINOPHEN 650 MG RE SUPP: 650.0000 mg | RECTAL | Status: DC | PRN

## 2019-12-04 MED ORDER — LACTATED RINGERS IV SOLN: INTRAVENOUS | Status: DC | PRN

## 2019-12-04 MED ORDER — BUPIVACAINE HCL (PF) 0.25 % IJ SOLN
INTRAMUSCULAR | Status: AC
  Filled 2019-12-04: qty 30

## 2019-12-04 MED ORDER — PHENYLEPHRINE 40 MCG/ML (10ML) SYRINGE FOR IV PUSH (FOR BLOOD PRESSURE SUPPORT)
PREFILLED_SYRINGE | INTRAVENOUS | Status: AC
  Filled 2019-12-04: qty 10

## 2019-12-04 MED ORDER — OXYCODONE HCL 5 MG/5ML PO SOLN: 5.0000 mg | Freq: Once | ORAL | Status: DC | PRN

## 2019-12-04 MED ORDER — PROPOFOL 10 MG/ML IV BOLUS
INTRAVENOUS | Status: DC | PRN
  Administered 2019-12-04: 120 mg via INTRAVENOUS

## 2019-12-04 MED ORDER — FENTANYL CITRATE (PF) 250 MCG/5ML IJ SOLN
INTRAMUSCULAR | Status: DC | PRN
  Administered 2019-12-04: 100 ug via INTRAVENOUS
  Administered 2019-12-04 (×2): 50 ug via INTRAVENOUS

## 2019-12-04 MED ORDER — SUGAMMADEX SODIUM 200 MG/2ML IV SOLN
INTRAVENOUS | Status: DC | PRN
  Administered 2019-12-04: 150 mg via INTRAVENOUS

## 2019-12-04 MED ORDER — POLYETHYLENE GLYCOL 3350 17 G PO PACK: 17.0000 g | PACK | Freq: Every day | ORAL | Status: DC

## 2019-12-04 MED ORDER — HYDROMORPHONE HCL 1 MG/ML IJ SOLN: 0.5000 mg | INTRAMUSCULAR | Status: DC | PRN

## 2019-12-04 MED ORDER — LACTATED RINGERS IV SOLN: INTRAVENOUS | Status: DC

## 2019-12-04 MED ORDER — PROPOFOL 10 MG/ML IV BOLUS
INTRAVENOUS | Status: AC
  Filled 2019-12-04: qty 20

## 2019-12-04 MED ORDER — BUPIVACAINE-EPINEPHRINE 0.25% -1:200000 IJ SOLN
INTRAMUSCULAR | Status: DC | PRN
  Administered 2019-12-04: 6 mL

## 2019-12-04 MED ORDER — MIDAZOLAM HCL 2 MG/2ML IJ SOLN
INTRAMUSCULAR | Status: AC
  Filled 2019-12-04: qty 2

## 2019-12-04 MED ORDER — ROCURONIUM BROMIDE 10 MG/ML (PF) SYRINGE
PREFILLED_SYRINGE | INTRAVENOUS | Status: AC
  Filled 2019-12-04: qty 20

## 2019-12-04 MED ORDER — ROCURONIUM BROMIDE 50 MG/5ML IV SOSY
PREFILLED_SYRINGE | INTRAVENOUS | Status: DC | PRN
  Administered 2019-12-04: 20 mg via INTRAVENOUS
  Administered 2019-12-04: 50 mg via INTRAVENOUS
  Administered 2019-12-04: 10 mg via INTRAVENOUS

## 2019-12-04 MED ORDER — ONDANSETRON HCL 4 MG/2ML IJ SOLN
INTRAMUSCULAR | Status: AC
  Filled 2019-12-04: qty 2

## 2019-12-04 MED ORDER — ONDANSETRON HCL 4 MG/2ML IJ SOLN
INTRAMUSCULAR | Status: DC | PRN
  Administered 2019-12-04: 4 mg via INTRAVENOUS

## 2019-12-04 MED ORDER — DEXAMETHASONE SODIUM PHOSPHATE 10 MG/ML IJ SOLN
INTRAMUSCULAR | Status: AC
  Filled 2019-12-04: qty 1

## 2019-12-04 MED ORDER — FENTANYL CITRATE (PF) 100 MCG/2ML IJ SOLN: 25.0000 ug | INTRAMUSCULAR | Status: DC | PRN

## 2019-12-04 MED ORDER — OXYCODONE HCL 5 MG PO TABS: 5.0000 mg | ORAL_TABLET | Freq: Once | ORAL | Status: DC | PRN

## 2019-12-04 MED ORDER — METHOCARBAMOL 1000 MG/10ML IJ SOLN
500.0000 mg | Freq: Four times a day (QID) | INTRAVENOUS | Status: DC | PRN
  Filled 2019-12-04: qty 5

## 2019-12-04 MED ORDER — CHLORHEXIDINE GLUCONATE 4 % EX LIQD: 60.0000 mL | Freq: Once | CUTANEOUS | Status: DC

## 2019-12-04 MED ORDER — EPHEDRINE 5 MG/ML INJ
INTRAVENOUS | Status: AC
  Filled 2019-12-04: qty 10

## 2019-12-04 MED ORDER — 0.9 % SODIUM CHLORIDE (POUR BTL) OPTIME
TOPICAL | Status: DC | PRN
  Administered 2019-12-04: 1000 mL

## 2019-12-04 MED ORDER — CEFAZOLIN SODIUM-DEXTROSE 2-4 GM/100ML-% IV SOLN
2.0000 g | Freq: Once | INTRAVENOUS | Status: AC
  Administered 2019-12-04: 2 g via INTRAVENOUS
  Filled 2019-12-04: qty 100

## 2019-12-04 MED ORDER — MENTHOL 3 MG MT LOZG
1.0000 | LOZENGE | OROMUCOSAL | Status: DC | PRN
  Filled 2019-12-04: qty 9

## 2019-12-04 MED ORDER — CEFAZOLIN SODIUM-DEXTROSE 2-4 GM/100ML-% IV SOLN
INTRAVENOUS | Status: AC
  Filled 2019-12-04: qty 100

## 2019-12-04 MED ORDER — MIDAZOLAM HCL 5 MG/5ML IJ SOLN
INTRAMUSCULAR | Status: DC | PRN
  Administered 2019-12-04: 2 mg via INTRAVENOUS

## 2019-12-04 MED ORDER — LIDOCAINE HCL (CARDIAC) PF 100 MG/5ML IV SOSY
PREFILLED_SYRINGE | INTRAVENOUS | Status: DC | PRN
  Administered 2019-12-04: 60 mg via INTRATRACHEAL

## 2019-12-04 MED ORDER — SODIUM CHLORIDE 0.9% FLUSH: 3.0000 mL | INTRAVENOUS | Status: DC | PRN

## 2019-12-04 MED ORDER — ACETAMINOPHEN 325 MG PO TABS
650.0000 mg | ORAL_TABLET | ORAL | Status: DC | PRN
  Administered 2019-12-04 – 2019-12-05 (×2): 650 mg via ORAL
  Filled 2019-12-04 (×2): qty 2

## 2019-12-04 MED ORDER — LIDOCAINE 2% (20 MG/ML) 5 ML SYRINGE
INTRAMUSCULAR | Status: AC
  Filled 2019-12-04: qty 10

## 2019-12-04 MED ORDER — FENTANYL CITRATE (PF) 250 MCG/5ML IJ SOLN
INTRAMUSCULAR | Status: AC
  Filled 2019-12-04: qty 5

## 2019-12-04 MED ORDER — ROCURONIUM BROMIDE 10 MG/ML (PF) SYRINGE
PREFILLED_SYRINGE | INTRAVENOUS | Status: AC
  Filled 2019-12-04: qty 10

## 2019-12-04 MED ORDER — ONDANSETRON HCL 4 MG/2ML IJ SOLN: 4.0000 mg | Freq: Four times a day (QID) | INTRAMUSCULAR | Status: DC | PRN

## 2019-12-04 MED ORDER — SODIUM CHLORIDE 0.9 % IV SOLN: INTRAVENOUS | Status: DC

## 2019-12-04 MED ORDER — SUCCINYLCHOLINE CHLORIDE 200 MG/10ML IV SOSY
PREFILLED_SYRINGE | INTRAVENOUS | Status: AC
  Filled 2019-12-04: qty 10

## 2019-12-04 MED ORDER — LORATADINE 10 MG PO TABS: 10.0000 mg | ORAL_TABLET | Freq: Every day | ORAL | Status: DC

## 2019-12-04 MED ORDER — METHOCARBAMOL 500 MG PO TABS
500.0000 mg | ORAL_TABLET | Freq: Four times a day (QID) | ORAL | Status: DC | PRN
  Administered 2019-12-04: 500 mg via ORAL
  Filled 2019-12-04: qty 1

## 2019-12-04 MED ORDER — PHENOL 1.4 % MT LIQD: 1.0000 | OROMUCOSAL | Status: DC | PRN

## 2019-12-04 MED ORDER — HEMOSTATIC AGENTS (NO CHARGE) OPTIME
TOPICAL | Status: DC | PRN
  Administered 2019-12-04: 1 via TOPICAL

## 2019-12-04 MED ORDER — OXYCODONE HCL 5 MG PO TABS
5.0000 mg | ORAL_TABLET | ORAL | Status: DC | PRN
  Administered 2019-12-04 – 2019-12-05 (×4): 5 mg via ORAL
  Filled 2019-12-04 (×4): qty 1

## 2019-12-04 MED ORDER — DEXAMETHASONE SODIUM PHOSPHATE 10 MG/ML IJ SOLN
INTRAMUSCULAR | Status: DC | PRN
  Administered 2019-12-04: 5 mg via INTRAVENOUS

## 2019-12-04 MED ORDER — DOCUSATE SODIUM 100 MG PO CAPS
100.0000 mg | ORAL_CAPSULE | Freq: Two times a day (BID) | ORAL | Status: DC
  Administered 2019-12-04: 22:00:00 100 mg via ORAL
  Filled 2019-12-04: qty 1

## 2019-12-04 MED ORDER — SODIUM CHLORIDE 0.9% FLUSH
3.0000 mL | Freq: Two times a day (BID) | INTRAVENOUS | Status: DC
  Administered 2019-12-04: 3 mL via INTRAVENOUS

## 2019-12-04 MED ORDER — ONDANSETRON HCL 4 MG PO TABS: 4.0000 mg | ORAL_TABLET | Freq: Four times a day (QID) | ORAL | Status: DC | PRN

## 2019-12-04 SURGICAL SUPPLY — 54 items
BENZOIN TINCTURE PRP APPL 2/3 (GAUZE/BANDAGES/DRESSINGS) ×3 IMPLANT
BLADE CLIPPER SURG (BLADE) IMPLANT
BUR ROUND FLUTED 4 SOFT TCH (BURR) IMPLANT
BUR ROUND FLUTED 4MM SOFT TCH (BURR)
CLOSURE WOUND 1/2 X4 (GAUZE/BANDAGES/DRESSINGS) ×1
COLLAR CERV LO CONTOUR FIRM DE (SOFTGOODS) IMPLANT
CORD BIPOLAR FORCEPS 12FT (ELECTRODE) ×3 IMPLANT
COVER SURGICAL LIGHT HANDLE (MISCELLANEOUS) ×3 IMPLANT
COVER WAND RF STERILE (DRAPES) ×3 IMPLANT
DRAPE C-ARM 42X72 X-RAY (DRAPES) ×3 IMPLANT
DRAPE HALF SHEET 40X57 (DRAPES) ×3 IMPLANT
DRAPE MICROSCOPE LEICA (MISCELLANEOUS) ×3 IMPLANT
DURAPREP 6ML APPLICATOR 50/CS (WOUND CARE) ×3 IMPLANT
ELECT COATED BLADE 2.86 ST (ELECTRODE) ×3 IMPLANT
ELECT REM PT RETURN 9FT ADLT (ELECTROSURGICAL) ×3
ELECTRODE REM PT RTRN 9FT ADLT (ELECTROSURGICAL) ×1 IMPLANT
EVACUATOR 1/8 PVC DRAIN (DRAIN) ×3 IMPLANT
GAUZE SPONGE 4X4 12PLY STRL (GAUZE/BANDAGES/DRESSINGS) ×3 IMPLANT
GLOVE BIOGEL PI IND STRL 8 (GLOVE) ×2 IMPLANT
GLOVE BIOGEL PI INDICATOR 8 (GLOVE) ×4
GLOVE ORTHO TXT STRL SZ7.5 (GLOVE) ×6 IMPLANT
GOWN STRL REUS W/ TWL LRG LVL3 (GOWN DISPOSABLE) ×1 IMPLANT
GOWN STRL REUS W/ TWL XL LVL3 (GOWN DISPOSABLE) ×1 IMPLANT
GOWN STRL REUS W/TWL 2XL LVL3 (GOWN DISPOSABLE) ×3 IMPLANT
GOWN STRL REUS W/TWL LRG LVL3 (GOWN DISPOSABLE) ×3
GOWN STRL REUS W/TWL XL LVL3 (GOWN DISPOSABLE) ×3
HALTER HD/CHIN CERV TRACTION D (MISCELLANEOUS) ×3 IMPLANT
HEMOSTAT SURGICEL 2X14 (HEMOSTASIS) IMPLANT
KIT BASIN OR (CUSTOM PROCEDURE TRAY) ×3 IMPLANT
KIT TURNOVER KIT B (KITS) ×3 IMPLANT
MANIFOLD NEPTUNE II (INSTRUMENTS) IMPLANT
MATRIX HEMOSTAT SURGIFLO (HEMOSTASIS) ×2 IMPLANT
NDL 25GX 5/8IN NON SAFETY (NEEDLE) ×1 IMPLANT
NEEDLE 25GX 5/8IN NON SAFETY (NEEDLE) ×3 IMPLANT
NS IRRIG 1000ML POUR BTL (IV SOLUTION) ×3 IMPLANT
PACK ORTHO CERVICAL (CUSTOM PROCEDURE TRAY) ×3 IMPLANT
PAD ARMBOARD 7.5X6 YLW CONV (MISCELLANEOUS) ×6 IMPLANT
PATTIES SURGICAL .5 X.5 (GAUZE/BANDAGES/DRESSINGS) IMPLANT
PLATE ANT CERV XTEND 2 LV 32 (Plate) ×2 IMPLANT
POSITIONER HEAD DONUT 9IN (MISCELLANEOUS) ×3 IMPLANT
RESTRAINT LIMB HOLDER UNIV (RESTRAINTS) IMPLANT
SCREW XTD VAR 4.2 SELF TAP 12 (Screw) ×12 IMPLANT
SPACER CERVICAL FRGE 12X14X7-7 (Spacer) ×2 IMPLANT
SPACER CERVICAL FRGE 12X14X8-7 (Spacer) ×2 IMPLANT
STRIP CLOSURE SKIN 1/2X4 (GAUZE/BANDAGES/DRESSINGS) ×2 IMPLANT
SURGIFLO W/THROMBIN 8M KIT (HEMOSTASIS) IMPLANT
SUT BONE WAX W31G (SUTURE) ×3 IMPLANT
SUT VIC AB 3-0 X1 27 (SUTURE) ×3 IMPLANT
SUT VICRYL 4-0 PS2 18IN ABS (SUTURE) ×6 IMPLANT
TAPE CLOTH SURG 4X10 WHT LF (GAUZE/BANDAGES/DRESSINGS) ×2 IMPLANT
TOWEL GREEN STERILE (TOWEL DISPOSABLE) ×3 IMPLANT
TOWEL GREEN STERILE FF (TOWEL DISPOSABLE) ×3 IMPLANT
TRAY FOLEY W/BAG SLVR 16FR (SET/KITS/TRAYS/PACK)
TRAY FOLEY W/BAG SLVR 16FR ST (SET/KITS/TRAYS/PACK) IMPLANT

## 2019-12-04 NOTE — Anesthesia Preprocedure Evaluation (Signed)
Anesthesia Evaluation  Patient identified by MRN, date of birth, ID band Patient awake    Reviewed: Allergy & Precautions, H&P , NPO status , Patient's Chart, lab work & pertinent test results  Airway Mallampati: II   Neck ROM: full    Dental   Pulmonary neg pulmonary ROS,    breath sounds clear to auscultation       Cardiovascular hypertension,  Rhythm:regular Rate:Normal     Neuro/Psych    GI/Hepatic   Endo/Other    Renal/GU      Musculoskeletal   Abdominal   Peds  Hematology   Anesthesia Other Findings   Reproductive/Obstetrics                             Anesthesia Physical Anesthesia Plan  ASA: II  Anesthesia Plan: General   Post-op Pain Management:    Induction: Intravenous  PONV Risk Score and Plan: 3 and Ondansetron, Dexamethasone, Midazolam and Treatment may vary due to age or medical condition  Airway Management Planned: Oral ETT  Additional Equipment:   Intra-op Plan:   Post-operative Plan: Extubation in OR  Informed Consent: I have reviewed the patients History and Physical, chart, labs and discussed the procedure including the risks, benefits and alternatives for the proposed anesthesia with the patient or authorized representative who has indicated his/her understanding and acceptance.       Plan Discussed with: CRNA, Anesthesiologist and Surgeon  Anesthesia Plan Comments:         Anesthesia Quick Evaluation  

## 2019-12-04 NOTE — Progress Notes (Signed)
Orthopedic Tech Progress Note Patient Details:  Nicole Blackburn 03-Sep-1961 [id number]  Ortho Devices Type of Ortho Device: Soft collar Ortho Device/Splint Interventions: Other (comment)   Post Interventions Patient Tolerated: Other (comment) Instructions Provided: Other (comment)   Donald Pore 12/04/2019, 7:00 PM

## 2019-12-04 NOTE — H&P (Signed)
Nicole Blackburn is an 59 y.o. female.   Chief Complaint: neck pain and UE radiculopathy  HPI: 59 year old white female with history of C5-6 and C6-7 HNP/stenosis comes in for preop evaluation.  States that symptoms unchanged and she continues to have neck pain with bilateral upper extremity radiculopathy.  She is want to proceed with C5-6 and C6-7 ACDF as scheduled.  Past Medical History:  Diagnosis Date  . Abnormal pap 1997/1998   cryo  . Anemia   . HLD (hyperlipidemia)    diet controlled, no meds  . Hypertension    no meds currently    Past Surgical History:  Procedure Laterality Date  . COLONOSCOPY    . REFRACTIVE SURGERY Bilateral 2012   Dr Nile Riggs  . UPPER GI ENDOSCOPY    . WISDOM TOOTH EXTRACTION  age 51's    Family History  Problem Relation Age of Onset  . COPD Mother   . Osteoporosis Mother   . Pulmonary embolism Mother   . Diabetes Father   . Hypertension Father   . Colon polyps Father   . Hypertension Sister   . Hypertension Brother   . Stroke Maternal Grandmother 60  . Cancer Paternal Grandmother 31       multiple myloma  . Cancer Paternal Grandfather        ? kind cancer   Social History:  reports that she has never smoked. She has never used smokeless tobacco. She reports current alcohol use of about 3.0 standard drinks of alcohol per week. She reports that she does not use drugs.  Allergies: No Known Allergies  Medications Prior to Admission  Medication Sig Dispense Refill  . acetaminophen (TYLENOL) 500 MG tablet Take 1,000 mg by mouth every 6 (six) hours as needed for moderate pain or headache.    . bismuth subsalicylate (PEPTO BISMOL) 262 MG/15ML suspension Take 30 mLs by mouth every 6 (six) hours as needed for indigestion or diarrhea or loose stools.    Marland Kitchen loratadine (CLARITIN) 10 MG tablet Take 10 mg by mouth daily.    . Multiple Vitamins-Minerals (MULTIVITAMIN PO) Take 1 tablet by mouth daily.     . psyllium (METAMUCIL) 0.52 G capsule Take 0.52 g by  mouth daily.    . Calcium Carbonate-Vit D-Min (CALCIUM 1200 PO) Take 1 tablet by mouth 3 (three) times a week.     . fenofibrate 160 MG tablet TAKE 1 TABLET (160 MG TOTAL) BY MOUTH DAILY. *NEEDS OFFICE VISIT* (Patient not taking: No sig reported) 90 tablet 0    Results for orders placed or performed during the hospital encounter of 12/03/19 (from the past 48 hour(s))  SARS CORONAVIRUS 2 (TAT 6-24 HRS) Nasopharyngeal Nasopharyngeal Swab     Status: None   Collection Time: 12/03/19 10:32 AM   Specimen: Nasopharyngeal Swab  Result Value Ref Range   SARS Coronavirus 2 NEGATIVE NEGATIVE    Comment: (NOTE) SARS-CoV-2 target nucleic acids are NOT DETECTED. The SARS-CoV-2 RNA is generally detectable in upper and lower respiratory specimens during the acute phase of infection. Negative results do not preclude SARS-CoV-2 infection, do not rule out co-infections with other pathogens, and should not be used as the sole basis for treatment or other patient management decisions. Negative results must be combined with clinical observations, patient history, and epidemiological information. The expected result is Negative. Fact Sheet for Patients: HairSlick.no Fact Sheet for Healthcare Providers: quierodirigir.com This test is not yet approved or cleared by the [place] and  has been authorized  for detection and/or diagnosis of SARS-CoV-2 by FDA under an Emergency Use Authorization (EUA). This EUA will remain  in effect (meaning this test can be used) for the duration of the COVID-19 declaration under Section 56 4(b)(1) of the Act, 21 U.S.C. section 360bbb-3(b)(1), unless the authorization is terminated or revoked sooner. Performed at [facility], [place] [address] [place], [place] [address]    No results found.  Review of Systems  Constitutional: Negative.   HENT: Negative.   Respiratory: Negative.   Cardiovascular:  Negative.   Gastrointestinal: Negative.   Genitourinary: Negative.   Musculoskeletal: Positive for neck pain and neck stiffness.  Neurological: Positive for numbness.  Psychiatric/Behavioral: Negative.     Blood pressure 117/70, pulse 87, temperature 98 F (36.7 C), temperature source Oral, resp. rate 18, height 4\' 11"  (1.499 m), weight 49 kg, last menstrual period 02/24/2015, SpO2 100 %. Physical Exam  Constitutional: She is oriented to person, place, and time. She appears well-developed. No distress.  HENT:  Head: Normocephalic and atraumatic.  Eyes: Pupils are equal, round, and reactive to light. EOM are normal.  Cardiovascular: Normal heart sounds.  Respiratory: Effort normal and breath sounds normal. No respiratory distress.  GI: Soft. She exhibits no distension. There is no abdominal tenderness.  Musculoskeletal:        General: Tenderness present.  Neurological: She is alert and oriented to person, place, and time.  Skin: Skin is warm and dry.     Assessment/Plan C5-6 and C6-7 HNP/stenosis  We will proceed with C5-C7 adcf as scheduled.  Surgical procedure discussed along with possible recovery time.  All questions answered.    Benjiman Core, PA-C 12/04/2019, 11:31 AM

## 2019-12-04 NOTE — Transfer of Care (Signed)
Immediate Anesthesia Transfer of Care Note  Patient: Nicole Blackburn  Procedure(s) Performed: CERVICAL FIVE-SIX, CERVICAL SIX-SEVEN ANTERIOR CERVICAL DECOMPRESSION/DISCECTOMY FUSION ALLOGRAFT PLATE (N/A Spine Cervical)  Patient Location: PACU  Anesthesia Type:General  Level of Consciousness: awake, alert  and oriented  Airway & Oxygen Therapy: Patient Spontanous Breathing and Patient connected to face mask oxygen  Post-op Assessment: Report given to RN and Post -op Vital signs reviewed and stable  Post vital signs: Reviewed and stable  Last Vitals:  Vitals Value Taken Time  BP 160/80 12/04/19 1517  Temp 36.6 C 12/04/19 1517  Pulse 82 12/04/19 1522  Resp 13 12/04/19 1522  SpO2 100 % 12/04/19 1522  Vitals shown include unvalidated device data.  Last Pain:  Vitals:   12/04/19 1109  TempSrc:   PainSc: 7       Patients Stated Pain Goal: 2 (12/04/19 1109)  Complications: No apparent anesthesia complications

## 2019-12-04 NOTE — Anesthesia Procedure Notes (Signed)
Procedure Name: Intubation Date/Time: 12/04/2019 1:08 PM Performed by: Glynda Jaeger, CRNA Pre-anesthesia Checklist: Patient identified, Patient being monitored, Timeout performed, Emergency Drugs available and Suction available Patient Re-evaluated:Patient Re-evaluated prior to induction Oxygen Delivery Method: Circle System Utilized Preoxygenation: Pre-oxygenation with 100% oxygen Induction Type: IV induction Ventilation: Mask ventilation without difficulty Laryngoscope Size: Mac and 3 Grade View: Grade I Tube type: Oral Tube size: 7.0 mm Number of attempts: 1 Airway Equipment and Method: Stylet Placement Confirmation: ETT inserted through vocal cords under direct vision,  positive ETCO2 and breath sounds checked- equal and bilateral Secured at: 21 cm Tube secured with: Tape Dental Injury: Teeth and Oropharynx as per pre-operative assessment

## 2019-12-04 NOTE — Interval H&P Note (Signed)
History and Physical Interval Note:  12/04/2019 12:28 PM  Nicole Blackburn  has presented today for surgery, with the diagnosis of c5-6, c6-7 spondylosis, foraminal stenosis.  The various methods of treatment have been discussed with the patient and family. After consideration of risks, benefits and other options for treatment, the patient has consented to  Procedure(s): C5-6, C6-7 ANTERIOR CERVICAL DECOMPRESSION/DISCECTOMY FUSION ALLOGRAFT PLATE (N/A) as a surgical intervention.  The patient's history has been reviewed, patient examined, no change in status, stable for surgery.  I have reviewed the patient's chart and labs.  Questions were answered to the patient's satisfaction.     [name]

## 2019-12-05 ENCOUNTER — Encounter: Payer: Self-pay | Admitting: *Deleted

## 2019-12-05 DIAGNOSIS — M4722 Other spondylosis with radiculopathy, cervical region: Secondary | ICD-10-CM | POA: Diagnosis not present

## 2019-12-05 MED ORDER — METHOCARBAMOL 500 MG PO TABS: 500.0000 mg | ORAL_TABLET | Freq: Three times a day (TID) | ORAL | 0 refills | Status: DC | PRN

## 2019-12-05 MED ORDER — OXYCODONE-ACETAMINOPHEN 5-325 MG PO TABS: 1.0000 | ORAL_TABLET | Freq: Four times a day (QID) | ORAL | 0 refills | Status: DC | PRN

## 2019-12-05 NOTE — Plan of Care (Signed)
Pt doing well. Pt given D/C instructions with verbal understanding. Rx's were sent to pharmacy by MD. Pt's incision is clean and dry with no sign of infection. Dressing was changed per MD order. Pt's IV and Hemovac were removed prior to D/C. Pt D/C'd home via wheelchair per MD order. Pt is stable @ D/C and has no other needs at this time. Rema Fendt, RN

## 2019-12-05 NOTE — Op Note (Signed)
Op Note 12/04/19  Preop diagnosis: Cervical spondylosis foraminal stenosis C5-6, C6-7  Postop diagnosis: Same  Procedure: C5-6, C6-7 anterior cervical discectomy and fusion, allograft and plate.  Surgeon: Annell Greening, MD  Assistant: Zonia Kief, PA-C medically necessary and present for the entire procedure  Anesthesia General orotracheal. Plus 6cc marcaine  EBL 100 cc.  Implants XTEND Plate 32 mm , forage cervical cortical cancellous allograft.  8 mm graft at C5-6 and 7 mm graft at C6-7.  4.2 x 12 mm screws x6.  Procedure: After induction general anesthesia orotracheal intubation halter traction without weight wrist restraints were pulled down for x-rays neck was prepped with DuraPrep and squared with towels and sterile skin marker Betadine Steri-Drape sterile female scan of the head and thyroid sheets and drapes.  Timeout procedure completed Ancef was given prophylactically.  Positive MSSA was noted.  Incision was made starting midline extending to the left platysma was divided in line with skin incision underneath the omohyoid C5-6 C6-7 spurs were noted and 25 needle was placed in C6-7 confirmed with lateral x-ray.  C5-6 level was addressed first self-retaining Cloward tractors were placed.  Operative microscope was draped brought in and discectomy was performed progressing down the posterior longitudinal ligament where there are overhanging spurs and extruded disc.  More tightness on the left than the right was noted and after decompression dura was free without areas of compression.  Trial sizer showed an 8 mm graft fit nicely restore disc height.  Identical procedure was repeated at the C6-7 level and a 7 mm graft was placed at this level.  Plate was selected originally 30 mm slightly distort we will switch to 32 mm and superior screws were angled cephalad a little close to the disc space so they were backed out redirected after predrilling and AP lateral fluoroscopic images showed good position  alignment of the plate screws and graft.  Surgi-Flo was used in the epidural space.  Hemovac drain was placed within the knot technique platysma reapproximated with 3-0 Vicryl 4-0 Vicryl septic or closure tincture benzoin Steri-Strips postop dressing Marcaine infiltration and soft dressing was applied with soft collar.

## 2019-12-05 NOTE — Discharge Instructions (Signed)
Keep collar on at all times except when you swap the shower curtain wrapped in Saran wrap for taking a shower.  After drying off reapply dry collar.  We will do better if you eat soft food and liquids and avoid chunks of food that are harder to swallow for 1 to 2 weeks.  He will have less pain if he sleeps in a beachchair position or propped up position like a recliner for several days.  Walking daily is recommended.  See Dr. Ophelia Charter in 1 week.

## 2019-12-05 NOTE — Evaluation (Signed)
Occupational Therapy Evaluation Patient Details Name: Nicole Blackburn MRN: [id number] DOB: Jan 13, 1961 Today's Date: 12/05/2019    History of Present Illness 60 yo female s/p C 5-7 decompression and fusion. PMH including HTN and HLD.    Clinical Impression   PTA, pt was living with her husband and was independent. Currently, pt performing ADLs and functional mobility at Mod I level with increased time. Provided education and handout on cervical precautions, collar management, bed mobility, grooming, UB ADLs, LB ADLs, toileting, and functional transfers; pt demonstrated understanding. Answered all pt questions. Recommend dc home once medically stable per physician. All acute OT needs met and will sign off. Thank you.    Follow Up Recommendations  No OT follow up    Equipment Recommendations  None recommended by OT    Recommendations for Other Services       Precautions / Restrictions Precautions Precautions: Cervical Precaution Booklet Issued: Yes (comment) Precaution Comments: Educated on cervical precautions and compensatory techniques Required Braces or Orthoses: Cervical Brace Cervical Brace: Soft collar;At all times      Mobility Bed Mobility Overal bed mobility: Modified Independent             General bed mobility comments: Pt demonstrating log roll technique  Transfers Overall transfer level: Independent                    Balance Overall balance assessment: No apparent balance deficits (not formally assessed)                                         ADL either performed or assessed with clinical judgement   ADL Overall ADL's : Modified independent                                       General ADL Comments: Educating pt on cervical precautions, collar management, bed mobility, UB ADLs, LB ADLs, grooming, toileting, and functional transfers. Pt performing at Mod I level with increased time.      Vision          Perception     Praxis      Pertinent Vitals/Pain Pain Assessment: Faces Faces Pain Scale: No hurt Pain Intervention(s): Monitored during session     Hand Dominance Right   Extremity/Trunk Assessment Upper Extremity Assessment Upper Extremity Assessment: Overall WFL for tasks assessed   Lower Extremity Assessment Lower Extremity Assessment: Overall WFL for tasks assessed   Cervical / Trunk Assessment Cervical / Trunk Assessment: Other exceptions Cervical / Trunk Exceptions: s/p cervical sx   Communication Communication Communication: No difficulties   Cognition Arousal/Alertness: Awake/alert Behavior During Therapy: WFL for tasks assessed/performed Overall Cognitive Status: Within Functional Limits for tasks assessed                                     General Comments       Exercises     Shoulder Instructions      Home Living Family/patient expects to be discharged to:: Private residence Living Arrangements: Spouse/significant other Available Help at Discharge: Family;Available PRN/intermittently Type of Home: House Home Access: Stairs to enter Entrance Stairs-Number of Steps: 1   Home Layout: Two level;Full bath on main level(Master bedroom on main level)  Alternate Level Stairs-Number of Steps: Flight Alternate Level Stairs-Rails: Can reach both Bathroom Shower/Tub: Occupational psychologist: Standard     Home Equipment: None          Prior Functioning/Environment Level of Independence: Independent        Comments: Works as an Paramedic List: Decreased activity tolerance;Decreased range of motion;Decreased knowledge of precautions      OT Treatment/Interventions:      OT Goals(Current goals can be found in the care plan section) Acute Rehab OT Goals Patient Stated Goal: Go home later today OT Goal Formulation: All assessment and education complete, DC therapy  OT Frequency:     Barriers to D/C:             Co-evaluation              AM-PAC OT "6 Clicks" Daily Activity     Outcome Measure Help from another person eating meals?: None Help from another person taking care of personal grooming?: None Help from another person toileting, which includes using toliet, bedpan, or urinal?: None Help from another person bathing (including washing, rinsing, drying)?: None Help from another person to put on and taking off regular upper body clothing?: None Help from another person to put on and taking off regular lower body clothing?: None 6 Click Score: 24   End of Session Equipment Utilized During Treatment: Cervical collar Nurse Communication: Mobility status  Activity Tolerance: Patient tolerated treatment well Patient left: in bed;with call bell/phone within reach  OT Visit Diagnosis: Muscle weakness (generalized) (M62.81)                Time: 4627-0350 OT Time Calculation (min): 15 min Charges:  OT General Charges $OT Visit: 1 Visit OT Evaluation $OT Eval Low Complexity: 1 Low  [place], OTR/L Acute Rehab Pager: [phone] Office: [place] 12/05/2019, 8:06 AM

## 2019-12-05 NOTE — Progress Notes (Signed)
   Subjective: 1 Day Post-Op Procedure(s) (LRB): CERVICAL FIVE-SIX, CERVICAL SIX-SEVEN ANTERIOR CERVICAL DECOMPRESSION/DISCECTOMY FUSION ALLOGRAFT PLATE (N/A) Patient reports pain as 1 on 0-10 scale.  Good relief of pre-op arm pain and numbness. "my pain is gone"  Objective: Vital signs in last 24 hours: Temp:  [97.6 F (36.4 C)-98.6 F (37 C)] 98.4 F (36.9 C) (03/25 0351) Pulse Rate:  [74-87] 74 (03/25 0351) Resp:  [11-18] 18 (03/25 0351) BP: (106-160)/(70-93) 106/73 (03/25 0351) SpO2:  [95 %-100 %] 96 % (03/25 0351) Weight:  [49 kg] 49 kg (03/24 1034)  Intake/Output from previous day: 03/24 0701 - 03/25 0700 In: 800 [I.V.:700; IV Piggyback:100] Out: 180 [Drains:30; Blood:150] Intake/Output this shift: Total I/O In: -  Out: 25 [Drains:25]  No results for input(s): HGB in the last 72 hours. No results for input(s): WBC, RBC, HCT, PLT in the last 72 hours. No results for input(s): NA, K, CL, CO2, BUN, CREATININE, GLUCOSE, CALCIUM in the last 72 hours. No results for input(s): LABPT, INR in the last 72 hours.  Neurologically intact DG Cervical Spine 2-3 Views  Result Date: 12/04/2019 CLINICAL DATA:  Imaging provided for anterior cervical disc fusion, C5 through C7. EXAM: CERVICAL SPINE - 2-3 VIEW; DG C-ARM 1-60 MIN COMPARISON:  None. FINDINGS: Three submitted portable images show placement of an anterior fusion plate and associated fixation screws spanning C5 through C7. The orthopedic hardware and bone graft material appear well seated and well-positioned. IMPRESSION: Imaging provided for anterior cervical disc fusion, C5 through C7. Well-positioned orthopedic hardware. Electronically Signed   By: Amie Portland M.D.   On: 12/04/2019 15:47   DG C-Arm 1-60 Min  Result Date: 12/04/2019 CLINICAL DATA:  Imaging provided for anterior cervical disc fusion, C5 through C7. EXAM: CERVICAL SPINE - 2-3 VIEW; DG C-ARM 1-60 MIN COMPARISON:  None. FINDINGS: Three submitted portable images  show placement of an anterior fusion plate and associated fixation screws spanning C5 through C7. The orthopedic hardware and bone graft material appear well seated and well-positioned. IMPRESSION: Imaging provided for anterior cervical disc fusion, C5 through C7. Well-positioned orthopedic hardware. Electronically Signed   By: Amie Portland M.D.   On: 12/04/2019 15:47    Assessment/Plan: 1 Day Post-Op Procedure(s) (LRB): CERVICAL FIVE-SIX, CERVICAL SIX-SEVEN ANTERIOR CERVICAL DECOMPRESSION/DISCECTOMY FUSION ALLOGRAFT PLATE (N/A)   Plan:  HV pulled, dressing change by RN, discharge home , office one week.   Nicole Blackburn 12/05/2019, 6:59 AM

## 2019-12-06 NOTE — Anesthesia Postprocedure Evaluation (Signed)
Anesthesia Post Note  Patient: Nicole Blackburn  Procedure(s) Performed: CERVICAL FIVE-SIX, CERVICAL SIX-SEVEN ANTERIOR CERVICAL DECOMPRESSION/DISCECTOMY FUSION ALLOGRAFT PLATE (N/A Spine Cervical)     Patient location during evaluation: PACU Anesthesia Type: General Level of consciousness: awake and alert Pain management: pain level controlled Vital Signs Assessment: post-procedure vital signs reviewed and stable Respiratory status: spontaneous breathing, nonlabored ventilation, respiratory function stable and patient connected to nasal cannula oxygen Cardiovascular status: blood pressure returned to baseline and stable Postop Assessment: no apparent nausea or vomiting Anesthetic complications: no    Last Vitals:  Vitals:   12/05/19 0351 12/05/19 0708  BP: 106/73 111/79  Pulse: 74 75  Resp: 18 16  Temp: 36.9 C 36.6 C  SpO2: 96% 98%    Last Pain:  Vitals:   12/05/19 0745  TempSrc:   PainSc: 2                  Lissandra Keil S

## 2019-12-06 NOTE — Discharge Summary (Signed)
Patient ID: Nicole Blackburn MRN: [id number] DOB/AGE: 1961-06-02 59 y.o.  Admit date: 12/04/2019 Discharge date: 12/06/2019  Admission Diagnoses:  Active Problems:   Other spondylosis with radiculopathy, cervical region   Foraminal stenosis of cervical region   Cervical spinal stenosis   Discharge Diagnoses:  Active Problems:   Other spondylosis with radiculopathy, cervical region   Foraminal stenosis of cervical region   Cervical spinal stenosis  status post Procedure(s): CERVICAL FIVE-SIX, CERVICAL SIX-SEVEN ANTERIOR CERVICAL DECOMPRESSION/DISCECTOMY FUSION ALLOGRAFT PLATE  Past Medical History:  Diagnosis Date  . Abnormal pap 1997/1998   cryo  . Anemia   . HLD (hyperlipidemia)    diet controlled, no meds  . Hypertension    no meds currently    Surgeries: Procedure(s): CERVICAL FIVE-SIX, CERVICAL SIX-SEVEN ANTERIOR CERVICAL DECOMPRESSION/DISCECTOMY FUSION ALLOGRAFT PLATE on 9/37/1696   Consultants:   Discharged Condition: Improved  Hospital Course: Nicole Blackburn is an 59 y.o. female who was admitted 12/04/2019 for operative treatment of cervical stenosis/HNP. Patient failed conservative treatments (please see the history and physical for the specifics) and had severe unremitting pain that affects sleep, daily activities and work/hobbies. After pre-op clearance, the patient was taken to the operating room on 12/04/2019 and underwent  Procedure(s): CERVICAL FIVE-SIX, CERVICAL SIX-SEVEN ANTERIOR CERVICAL DECOMPRESSION/DISCECTOMY FUSION ALLOGRAFT PLATE.    Patient was given perioperative antibiotics:  Anti-infectives (From admission, onward)   Start     Dose/Rate Route Frequency Ordered Stop   12/04/19 1100  ceFAZolin (ANCEF) IVPB 2g/100 mL premix     2 g 200 mL/hr over 30 Minutes Intravenous  Once 12/04/19 1048 12/04/19 1325   12/04/19 1056  ceFAZolin (ANCEF) 2-4 GM/100ML-% IVPB    Note to Pharmacy: Tamsen Snider   : cabinet override      12/04/19 1056 12/04/19 2259        Patient was given sequential compression devices and early ambulation to prevent DVT.   Patient benefited maximally from hospital stay and there were no complications. At the time of discharge, the patient was urinating/moving their bowels without difficulty, tolerating a regular diet, pain is controlled with oral pain medications and they have been cleared by PT/OT.   Recent vital signs: No data found.   Recent laboratory studies: No results for input(s): WBC, HGB, HCT, PLT, NA, K, CL, CO2, BUN, CREATININE, GLUCOSE, INR, CALCIUM in the last 72 hours.  Invalid input(s): PT, 2   Discharge Medications:   Allergies as of 12/05/2019   No Known Allergies     Medication List    STOP taking these medications   acetaminophen 500 MG tablet Commonly known as: TYLENOL     TAKE these medications   bismuth subsalicylate 789 FY/10FB suspension Commonly known as: PEPTO BISMOL Take 30 mLs by mouth every 6 (six) hours as needed for indigestion or diarrhea or loose stools.   CALCIUM 1200 PO Take 1 tablet by mouth 3 (three) times a week.   fenofibrate 160 MG tablet TAKE 1 TABLET (160 MG TOTAL) BY MOUTH DAILY. *NEEDS OFFICE VISIT*   loratadine 10 MG tablet Commonly known as: CLARITIN Take 10 mg by mouth daily.   Metamucil 0.52 g capsule Generic drug: psyllium Take 0.52 g by mouth daily.   methocarbamol 500 MG tablet Commonly known as: Robaxin Take 1 tablet (500 mg total) by mouth every 8 (eight) hours as needed for muscle spasms.   MULTIVITAMIN PO Take 1 tablet by mouth daily.   oxyCODONE-acetaminophen 5-325 MG tablet Commonly known as: Percocet Take 1-2 tablets by  mouth every 6 (six) hours as needed for severe pain. Post op pain       Diagnostic Studies: DG Cervical Spine 2-3 Views  Result Date: 12/04/2019 CLINICAL DATA:  Imaging provided for anterior cervical disc fusion, C5 through C7. EXAM: CERVICAL SPINE - 2-3 VIEW; DG C-ARM 1-60 MIN COMPARISON:  None. FINDINGS: Three  submitted portable images show placement of an anterior fusion plate and associated fixation screws spanning C5 through C7. The orthopedic hardware and bone graft material appear well seated and well-positioned. IMPRESSION: Imaging provided for anterior cervical disc fusion, C5 through C7. Well-positioned orthopedic hardware. Electronically Signed   By: Amie Portland M.D.   On: 12/04/2019 15:47   DG C-Arm 1-60 Min  Result Date: 12/04/2019 CLINICAL DATA:  Imaging provided for anterior cervical disc fusion, C5 through C7. EXAM: CERVICAL SPINE - 2-3 VIEW; DG C-ARM 1-60 MIN COMPARISON:  None. FINDINGS: Three submitted portable images show placement of an anterior fusion plate and associated fixation screws spanning C5 through C7. The orthopedic hardware and bone graft material appear well seated and well-positioned. IMPRESSION: Imaging provided for anterior cervical disc fusion, C5 through C7. Well-positioned orthopedic hardware. Electronically Signed   By: Amie Portland M.D.   On: 12/04/2019 15:47      Follow-up Information    [name], MD Follow up in 1 week(s).   Specialty: Orthopedic Surgery Contact information: [address] [place] [address] [phone]           Discharge Plan:  discharge to home  Disposition:     Signed: Zonia Kief  12/06/2019, 3:36 PM

## 2019-12-11 ENCOUNTER — Ambulatory Visit (INDEPENDENT_AMBULATORY_CARE_PROVIDER_SITE_OTHER): Payer: Managed Care, Other (non HMO)

## 2019-12-11 ENCOUNTER — Ambulatory Visit (INDEPENDENT_AMBULATORY_CARE_PROVIDER_SITE_OTHER): Payer: Managed Care, Other (non HMO) | Admitting: Orthopaedic Surgery

## 2019-12-11 ENCOUNTER — Encounter: Payer: Self-pay | Admitting: Orthopaedic Surgery

## 2019-12-11 ENCOUNTER — Other Ambulatory Visit: Payer: Self-pay

## 2019-12-11 VITALS — BP 101/70 | HR 91 | Ht 59.0 in | Wt 108.0 lb

## 2019-12-11 DIAGNOSIS — Z981 Arthrodesis status: Secondary | ICD-10-CM | POA: Diagnosis not present

## 2019-12-11 NOTE — Progress Notes (Signed)
Post-Op Visit Note   Patient: Nicole Blackburn           Date of Birth: [birth year]           MRN: [id number] Visit Date: 12/11/2019 PCP: Ann Held, DO   Assessment & Plan: Postop two-level cervical fusion C5-6 C6-7.  X-ray showed good position of graft and plate.  Good relief of neck and shoulder pain.  She still has some pain in both trochanters with walking.  She is having persistent problems after next she is we can obtain some lumbar spine x-rays.  Chief Complaint:  Chief Complaint  Patient presents with  . Neck - Routine Post Op    12/04/2019 C5-6,C6-7 ACDF. Allograft, Plate   Visit Diagnoses:  1. Status post cervical spinal fusion   2. S/P cervical spinal fusion     Plan: Return 5 weeks for lateral flexion-extension x-ray and AP x-ray out of collar.  If she is having persistent trochanteric discomfort will obtain AP and lateral lumbar x-rays at that time.  Follow-Up Instructions: Return in about 5 weeks (around 01/15/2020).   Orders:  Orders Placed This Encounter  Procedures  . XR Cervical Spine 2 or 3 views   No orders of the defined types were placed in this encounter.   Imaging: XR Cervical Spine 2 or 3 views  Result Date: 12/11/2019 AP lateral cervical spine x-rays show C5-6 C6-7 anterior cervical discectomy and fusion with allograft and plate in good position and alignment. Impression: Satisfactory two-level cervical fusion, C5 to C7   PMFS History: Patient Active Problem List   Diagnosis Date Noted  . S/P cervical spinal fusion 12/11/2019  . Cervical spinal stenosis 12/04/2019  . Other spondylosis with radiculopathy, cervical region 03/20/2019  . Dizzy 09/30/2018  . History of scoliosis 09/30/2018  . Right hip pain 09/30/2018  . Allergies 09/30/2018  . Rash 03/25/2018  . Fever 03/25/2018  . Muscle spasm 03/25/2018  . HTN (hypertension) 07/20/2015  . Chronic cough 12/11/2012  . Idiopathic scoliosis 12/11/2012  . Anemia    Past Medical History:   Diagnosis Date  . Abnormal pap 1997/1998   cryo  . Anemia   . HLD (hyperlipidemia)    diet controlled, no meds  . Hypertension    no meds currently    Family History  Problem Relation Age of Onset  . COPD Mother   . Osteoporosis Mother   . Pulmonary embolism Mother   . Diabetes Father   . Hypertension Father   . Colon polyps Father   . Hypertension Sister   . Hypertension Brother   . Stroke Maternal Grandmother 60  . Cancer Paternal Grandmother 43       multiple myloma  . Cancer Paternal Grandfather        ? kind cancer    Past Surgical History:  Procedure Laterality Date  . ANTERIOR CERVICAL DECOMP/DISCECTOMY FUSION N/A 12/04/2019   Procedure: CERVICAL FIVE-SIX, CERVICAL SIX-SEVEN ANTERIOR CERVICAL DECOMPRESSION/DISCECTOMY FUSION ALLOGRAFT PLATE;  Surgeon: Marybelle Killings, MD;  Location: [facility];  Service: Orthopedics;  Laterality: N/A;  . COLONOSCOPY    . REFRACTIVE SURGERY Bilateral 2012   Dr Gershon Crane  . UPPER GI ENDOSCOPY    . WISDOM TOOTH EXTRACTION  age 59's   Social History   Occupational History  . Occupation: fresh Counselling psychologist  Tobacco Use  . Smoking status: Never Smoker  . Smokeless tobacco: Never Used  Substance and Sexual Activity  . Alcohol use: Yes  Alcohol/week: 3.0 standard drinks    Types: 3 Standard drinks or equivalent per week    Comment: Beer/Wine  . Drug use: No  . Sexual activity: Yes    Partners: Male    Birth control/protection: Post-menopausal

## 2020-01-15 ENCOUNTER — Other Ambulatory Visit: Payer: Self-pay

## 2020-01-15 ENCOUNTER — Ambulatory Visit (INDEPENDENT_AMBULATORY_CARE_PROVIDER_SITE_OTHER): Payer: Managed Care, Other (non HMO)

## 2020-01-15 ENCOUNTER — Encounter: Payer: Self-pay | Admitting: Orthopaedic Surgery

## 2020-01-15 ENCOUNTER — Ambulatory Visit (INDEPENDENT_AMBULATORY_CARE_PROVIDER_SITE_OTHER): Payer: Managed Care, Other (non HMO) | Admitting: Orthopaedic Surgery

## 2020-01-15 VITALS — BP 124/84 | HR 76 | Ht 59.0 in | Wt 108.0 lb

## 2020-01-15 DIAGNOSIS — M545 Low back pain, unspecified: Secondary | ICD-10-CM

## 2020-01-15 DIAGNOSIS — Z981 Arthrodesis status: Secondary | ICD-10-CM | POA: Diagnosis not present

## 2020-01-15 NOTE — Progress Notes (Signed)
Post-Op Visit Note   Patient: Nicole Blackburn           Date of Birth: [birth year]           MRN: [id number] Visit Date: 01/15/2020 PCP: Ann Held, DO   Assessment & Plan: Patient has gotten good relief of her arm and hand pain.  X-rays show solid fusion C5-6 C6-7.  Discontinue collar.  We reviewed lumbar MRI images.  She not having claudication symptoms just pain that radiates into her hips and proximal thigh without weakness.  Should gradually work on a walking program to the progressed up to 2 miles per day.  Recheck 6 months.  Chief Complaint:  Chief Complaint  Patient presents with  . Neck - Follow-up    12/04/2019 C5-6, C6-7 ACDF  . Lower Back - Pain   Visit Diagnoses:  1. Acute bilateral low back pain, unspecified whether sciatica present   2. Status post cervical spinal fusion     Plan: We discussed patient's scoliosis apex at L2-3 with her current symptoms.  No myelopathic symptoms and no claudication symptoms.  Walking stretching program recommended recheck 6 months.  Follow-Up Instructions: Return in about 6 months (around 07/17/2020).   Orders:  Orders Placed This Encounter  Procedures  . XR Cervical Spine 2 or 3 views  . XR Lumbar Spine 2-3 Views   No orders of the defined types were placed in this encounter.   Imaging: No results found.  PMFS History: Patient Active Problem List   Diagnosis Date Noted  . S/P cervical spinal fusion 12/11/2019  . Cervical spinal stenosis 12/04/2019  . Other spondylosis with radiculopathy, cervical region 03/20/2019  . Dizzy 09/30/2018  . History of scoliosis 09/30/2018  . Right hip pain 09/30/2018  . Allergies 09/30/2018  . Rash 03/25/2018  . Fever 03/25/2018  . Muscle spasm 03/25/2018  . HTN (hypertension) 07/20/2015  . Chronic cough 12/11/2012  . Idiopathic scoliosis 12/11/2012  . Anemia    Past Medical History:  Diagnosis Date  . Abnormal pap 1997/1998   cryo  . Anemia   . HLD (hyperlipidemia)    diet controlled, no meds  . Hypertension    no meds currently    Family History  Problem Relation Age of Onset  . COPD Mother   . Osteoporosis Mother   . Pulmonary embolism Mother   . Diabetes Father   . Hypertension Father   . Colon polyps Father   . Hypertension Sister   . Hypertension Brother   . Stroke Maternal Grandmother 60  . Cancer Paternal Grandmother 68       multiple myloma  . Cancer Paternal Grandfather        ? kind cancer    Past Surgical History:  Procedure Laterality Date  . ANTERIOR CERVICAL DECOMP/DISCECTOMY FUSION N/A 12/04/2019   Procedure: CERVICAL FIVE-SIX, CERVICAL SIX-SEVEN ANTERIOR CERVICAL DECOMPRESSION/DISCECTOMY FUSION ALLOGRAFT PLATE;  Surgeon: Marybelle Killings, MD;  Location: [facility];  Service: Orthopedics;  Laterality: N/A;  . COLONOSCOPY    . REFRACTIVE SURGERY Bilateral 2012   Dr Gershon Crane  . UPPER GI ENDOSCOPY    . WISDOM TOOTH EXTRACTION  age 73's   Social History   Occupational History  . Occupation: fresh Counselling psychologist  Tobacco Use  . Smoking status: Never Smoker  . Smokeless tobacco: Never Used  Substance and Sexual Activity  . Alcohol use: Yes    Alcohol/week: 3.0 standard drinks    Types: 3 Standard drinks or equivalent per  week    Comment: Beer/Wine  . Drug use: No  . Sexual activity: Yes    Partners: Male    Birth control/protection: Post-menopausal

## 2020-02-20 ENCOUNTER — Encounter: Payer: Self-pay | Admitting: Family Medicine

## 2020-02-20 ENCOUNTER — Ambulatory Visit (INDEPENDENT_AMBULATORY_CARE_PROVIDER_SITE_OTHER): Payer: Managed Care, Other (non HMO) | Admitting: Family Medicine

## 2020-02-20 ENCOUNTER — Other Ambulatory Visit (HOSPITAL_COMMUNITY)
Admission: RE | Admit: 2020-02-20 | Discharge: 2020-02-20 | Disposition: A | Discharge status: Home / Self Care | Payer: Managed Care, Other (non HMO) | Source: Ambulatory Visit | Attending: Family Medicine | Admitting: Family Medicine

## 2020-02-20 ENCOUNTER — Other Ambulatory Visit: Payer: Self-pay

## 2020-02-20 VITALS — BP 128/80 | HR 78 | Temp 97.4°F | Resp 18 | Ht 59.0 in | Wt 109.0 lb

## 2020-02-20 DIAGNOSIS — Z0000 Encounter for general adult medical examination without abnormal findings: Secondary | ICD-10-CM | POA: Insufficient documentation

## 2020-02-20 DIAGNOSIS — K635 Polyp of colon: Secondary | ICD-10-CM | POA: Diagnosis not present

## 2020-02-20 DIAGNOSIS — E2839 Other primary ovarian failure: Secondary | ICD-10-CM

## 2020-02-20 LAB — LIPID PANEL
Cholesterol: 186 mg/dL (ref 0–200)
HDL: 72.4 mg/dL (ref >39.00)
LDL Cholesterol: 82 mg/dL (ref 0–99)
NonHDL: 113.66
Total CHOL/HDL Ratio: 3
Triglycerides: 158 mg/dL — ABNORMAL HIGH (ref 0.0–149.0)
VLDL: 31.6 mg/dL (ref 0.0–40.0)

## 2020-02-20 LAB — CBC WITH DIFFERENTIAL/PLATELET
Basophils Absolute: 0 10*3/uL (ref 0.0–0.1)
Basophils Relative: 0.5 % (ref 0.0–3.0)
Eosinophils Absolute: 0 10*3/uL (ref 0.0–0.7)
Eosinophils Relative: 0.7 % (ref 0.0–5.0)
HCT: 37.6 % (ref 36.0–46.0)
Hemoglobin: 12.4 g/dL (ref 12.0–15.0)
Lymphocytes Relative: 26.4 % (ref 12.0–46.0)
Lymphs Abs: 1.5 10*3/uL (ref 0.7–4.0)
MCHC: 33.1 g/dL (ref 30.0–36.0)
MCV: 89.6 fl (ref 78.0–100.0)
Monocytes Absolute: 0.4 10*3/uL (ref 0.1–1.0)
Monocytes Relative: 7.7 % (ref 3.0–12.0)
Neutro Abs: 3.6 10*3/uL (ref 1.4–7.7)
Neutrophils Relative %: 64.7 % (ref 43.0–77.0)
Platelets: 302 10*3/uL (ref 150.0–400.0)
RBC: 4.19 Mil/uL (ref 3.87–5.11)
RDW: 13.1 % (ref 11.5–15.5)
WBC: 5.6 10*3/uL (ref 4.0–10.5)

## 2020-02-20 LAB — COMPREHENSIVE METABOLIC PANEL
ALT: 10 U/L (ref 0–35)
AST: 16 U/L (ref 0–37)
Albumin: 4.3 g/dL (ref 3.5–5.2)
Alkaline Phosphatase: 110 U/L (ref 39–117)
BUN: 17 mg/dL (ref 6–23)
CO2: 30 mEq/L (ref 19–32)
Calcium: 10.1 mg/dL (ref 8.4–10.5)
Chloride: 101 mEq/L (ref 96–112)
Creatinine, Ser: 0.63 mg/dL (ref 0.40–1.20)
GFR: 96.79 mL/min (ref >60.00)
Glucose, Bld: 83 mg/dL (ref 70–99)
Potassium: 4.4 mEq/L (ref 3.5–5.1)
Sodium: 139 mEq/L (ref 135–145)
Total Bilirubin: 0.6 mg/dL (ref 0.2–1.2)
Total Protein: 7.3 g/dL (ref 6.0–8.3)

## 2020-02-20 LAB — TSH: TSH: 2.58 u[IU]/mL (ref 0.35–4.50)

## 2020-02-20 NOTE — Patient Instructions (Signed)
Preventive Care 2-59 Years Old, Female Preventive care refers to visits with your health care provider and lifestyle choices that can promote health and wellness. This includes:  A yearly physical exam. This may also be called an annual well check.  Regular dental visits and eye exams.  Immunizations.  Screening for certain conditions.  Healthy lifestyle choices, such as eating a healthy diet, getting regular exercise, not using drugs or products that contain nicotine and tobacco, and limiting alcohol use. What can I expect for my preventive care visit? Physical exam Your health care provider will check your:  Height and weight. This may be used to calculate body mass index (BMI), which tells if you are at a healthy weight.  Heart rate and blood pressure.  Skin for abnormal spots. Counseling Your health care provider may ask you questions about your:  Alcohol, tobacco, and drug use.  Emotional well-being.  Home and relationship well-being.  Sexual activity.  Eating habits.  Work and work Statistician.  Method of birth control.  Menstrual cycle.  Pregnancy history. What immunizations do I need?  Influenza (flu) vaccine  This is recommended every year. Tetanus, diphtheria, and pertussis (Tdap) vaccine  You may need a Td booster every 10 years. Varicella (chickenpox) vaccine  You may need this if you have not been vaccinated. Zoster (shingles) vaccine  You may need this after age 65. Measles, mumps, and rubella (MMR) vaccine  You may need at least one dose of MMR if you were born in 1957 or later. You may also need a second dose. Pneumococcal conjugate (PCV13) vaccine  You may need this if you have certain conditions and were not previously vaccinated. Pneumococcal polysaccharide (PPSV23) vaccine  You may need one or two doses if you smoke cigarettes or if you have certain conditions. Meningococcal conjugate (MenACWY) vaccine  You may need this if you  have certain conditions. Hepatitis A vaccine  You may need this if you have certain conditions or if you travel or work in places where you may be exposed to hepatitis A. Hepatitis B vaccine  You may need this if you have certain conditions or if you travel or work in places where you may be exposed to hepatitis B. Haemophilus influenzae type b (Hib) vaccine  You may need this if you have certain conditions. Human papillomavirus (HPV) vaccine  If recommended by your health care provider, you may need three doses over 6 months. You may receive vaccines as individual doses or as more than one vaccine together in one shot (combination vaccines). Talk with your health care provider about the risks and benefits of combination vaccines. What tests do I need? Blood tests  Lipid and cholesterol levels. These may be checked every 5 years, or more frequently if you are over 17 years old.  Hepatitis C test.  Hepatitis B test. Screening  Lung cancer screening. You may have this screening every year starting at age 90 if you have a 30-pack-year history of smoking and currently smoke or have quit within the past 15 years.  Colorectal cancer screening. All adults should have this screening starting at age 72 and continuing until age 37. Your health care provider may recommend screening at age 6 if you are at increased risk. You will have tests every 1-10 years, depending on your results and the type of screening test.  Diabetes screening. This is done by checking your blood sugar (glucose) after you have not eaten for a while (fasting). You may have this  done every 1-3 years.  Mammogram. This may be done every 1-2 years. Talk with your health care provider about when you should start having regular mammograms. This may depend on whether you have a family history of breast cancer.  BRCA-related cancer screening. This may be done if you have a family history of breast, ovarian, tubal, or peritoneal  cancers.  Pelvic exam and Pap test. This may be done every 3 years starting at age 76. Starting at age 89, this may be done every 5 years if you have a Pap test in combination with an HPV test. Other tests  Sexually transmitted disease (STD) testing.  Bone density scan. This is done to screen for osteoporosis. You may have this scan if you are at high risk for osteoporosis. Follow these instructions at home: Eating and drinking  Eat a diet that includes fresh fruits and vegetables, whole grains, lean protein, and low-fat dairy.  Take vitamin and mineral supplements as recommended by your health care provider.  Do not drink alcohol if: ? Your health care provider tells you not to drink. ? You are pregnant, may be pregnant, or are planning to become pregnant.  If you drink alcohol: ? Limit how much you have to 0-1 drink a day. ? Be aware of how much alcohol is in your drink. In the [place] one drink equals one 12 oz bottle of beer (355 mL), one 5 oz glass of wine (148 mL), or one 1 oz glass of hard liquor (44 mL). Lifestyle  Take daily care of your teeth and gums.  Stay active. Exercise for at least 30 minutes on 5 or more days each week.  Do not use any products that contain nicotine or tobacco, such as cigarettes, e-cigarettes, and chewing tobacco. If you need help quitting, ask your health care provider.  If you are sexually active, practice safe sex. Use a condom or other form of birth control (contraception) in order to prevent pregnancy and STIs (sexually transmitted infections).  If told by your health care provider, take low-dose aspirin daily starting at age 37. What's next?  Visit your health care provider once a year for a well check visit.  Ask your health care provider how often you should have your eyes and teeth checked.  Stay up to date on all vaccines. This information is not intended to replace advice given to you by your health care provider. Make sure you  discuss any questions you have with your health care provider. Document Revised: 05/10/2018 Document Reviewed: 05/10/2018 Elsevier Patient Education  2020 [employer].

## 2020-02-20 NOTE — Progress Notes (Signed)
Subjective:     Nicole Blackburn is a 59 y.o. female and is here for a comprehensive physical exam. The patient reports no new problems -- recently on her neck with neurosurgery .  Social History   Socioeconomic History  . Marital status: Married    Spouse name: Not on file  . Number of children: Not on file  . Years of education: Not on file  . Highest education level: Not on file  Occupational History  . 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 Smoking status: Never Smoker  . Smokeless tobacco: Never Used  Vaping Use  . Vaping Use: Never used  Substance and Sexual Activity  . Alcohol use: Yes    Alcohol/week: 3.0 standard drinks    Types: 3 Standard drinks or equivalent per week    Comment: Beer/Wine  . Drug use: No  . Sexual activity: Yes    Partners: Male    Birth control/protection: Post-menopausal  Other Topics Concern  . Not on file  Social History Narrative   Exercise-- elliptical   Social Determinants of Health   Financial Resource Strain:   . Difficulty of Paying Living Expenses:   Food Insecurity:   . Worried About Programme researcher, broadcasting/film/video in the Last Year:   . Barista in the Last Year:   Transportation Needs:   . Freight forwarder (Medical):   Marland Kitchen Lack of Transportation (Non-Medical):   Physical Activity:   . Days of Exercise per Week:   . Minutes of Exercise per Session:   Stress:   . Feeling of Stress :   Social Connections:   . Frequency of Communication with Friends and Family:   . Frequency of Social Gatherings with Friends and Family:   . Attends Religious Services:   . Active Member of Clubs or Organizations:   . Attends Banker Meetings:   Marland Kitchen Marital Status:   Intimate Partner Violence:   . Fear of Current or Ex-Partner:   . Emotionally Abused:   Marland Kitchen Physically Abused:   . Sexually Abused:    Health Maintenance  Topic Date Due  . COLONOSCOPY  03/01/2019  . PAP SMEAR-Modifier  03/22/2020  . INFLUENZA  VACCINE  04/12/2020  . MAMMOGRAM  06/20/2020  . TETANUS/TDAP  11/15/2028  . COVID-19 Vaccine  Completed  . Hepatitis C Screening  Completed  . HIV Screening  Completed    The following portions of the patient's history were reviewed and updated as appropriate:  She  has a past medical history of Abnormal pap (1997/1998), Anemia, HLD (hyperlipidemia), and Hypertension. She does not have any pertinent problems on file. She  has a past surgical history that includes Refractive surgery (Bilateral, 2012); Wisdom tooth extraction (age 62's); Upper gi endoscopy; Colonoscopy; and Anterior cervical decomp/discectomy fusion (N/A, 12/04/2019). Her family history includes COPD in her mother; Cancer in her paternal grandfather; Cancer (age of onset: 5) in her paternal grandmother; Colon polyps in her father; Diabetes in her father; Hypertension in her brother, father, and sister; Osteoporosis in her mother; Pulmonary embolism in her mother; Stroke (age of onset: 3) in her maternal grandmother. She  reports that she has never smoked. She has never used smokeless tobacco. She reports current alcohol use of about 3.0 standard drinks of alcohol per week. She reports that she does not use drugs. She has a current medication list which includes the following prescription(s): calcium carbonate-vit d-min, loratadine, multiple vitamin, and psyllium. Current Outpatient Medications on  File Prior to Visit  Medication Sig Dispense Refill  . Calcium Carbonate-Vit D-Min (CALCIUM 1200 PO) Take 1 tablet by mouth 3 (three) times a week.     . loratadine (CLARITIN) 10 MG tablet Take 10 mg by mouth daily.    . Multiple Vitamins-Minerals (MULTIVITAMIN PO) Take 1 tablet by mouth daily.     . psyllium (METAMUCIL) 0.52 G capsule Take 0.52 g by mouth daily.     No current facility-administered medications on file prior to visit.   She has No Known Allergies..  Review of Systems Review of Systems  Constitutional: Negative for  activity change, appetite change and fatigue.  HENT: Negative for hearing loss, congestion, tinnitus and ear discharge.  dentist q69m Eyes: Negative for visual disturbance (see optho q1y -- vision corrected to 20/20 with glasses).  Respiratory: Negative for cough, chest tightness and shortness of breath.   Cardiovascular: Negative for chest pain, palpitations and leg swelling.  Gastrointestinal: Negative for abdominal pain, diarrhea, constipation and abdominal distention.  Genitourinary: Negative for urgency, frequency, decreased urine volume and difficulty urinating.  Musculoskeletal: Negative for back pain, arthralgias and gait problem.  Skin: Negative for color change, pallor and rash.  Neurological: Negative for dizziness, light-headedness, numbness and headaches.  Hematological: Negative for adenopathy. Does not bruise/bleed easily.  Psychiatric/Behavioral: Negative for suicidal ideas, confusion, sleep disturbance, self-injury, dysphoric mood, decreased concentration and agitation.       Objective:    BP 128/80 (BP Location: Right Arm, Patient Position: Sitting, Cuff Size: Normal)   Pulse 78   Temp (!) 97.4 F (36.3 C) (Temporal)   Resp 18   Ht 4\' 11"  (1.499 m)   Wt 109 lb (49.4 kg)   LMP 02/24/2015   SpO2 98%   BMI 22.02 kg/m  General appearance: alert, cooperative, appears stated age and no distress Head: Normocephalic, without obvious abnormality, atraumatic Eyes: negative findings: lids and lashes normal, conjunctivae and sclerae normal and pupils equal, round, reactive to light and accomodation Ears: normal TM's and external ear canals both ears Neck: no adenopathy, no carotid bruit, no JVD, supple, symmetrical, trachea midline and thyroid not enlarged, symmetric, no tenderness/mass/nodules Back: scoliosis Lungs: clear to auscultation bilaterally Breasts: normal appearance, no masses or tenderness Heart: regular rate and rhythm, S1, S2 normal, no murmur, click, rub or  gallop Abdomen: soft, non-tender; bowel sounds normal; no masses,  no organomegaly Pelvic: cervix normal in appearance, external genitalia normal, no adnexal masses or tenderness, no cervical motion tenderness, rectovaginal septum normal, uterus normal size, shape, and consistency, vagina normal without discharge and psp done Extremities: extremities normal, atraumatic, no cyanosis or edema Pulses: 2+ and symmetric Skin: Skin color, texture, turgor normal. No rashes or lesions Lymph nodes: Cervical, supraclavicular, and axillary nodes normal. Neurologic: Alert and oriented X 3, normal strength and tone. Normal symmetric reflexes. Normal coordination and gait    Assessment:    Healthy female exam.      Plan:    ghm utd Check labs  See After Visit Summary for Counseling Recommendations    1. Preventative health care See above - Lipid panel - CBC with Differential/Platelet - TSH - Comprehensive metabolic panel - Cytology - PAP( [facility])  2. Polyp of colon, unspecified part of colon, unspecified type  - Ambulatory referral to Gastroenterology  3. Estrogen deficiency   - DG Bone Density; Future

## 2020-02-24 LAB — CYTOLOGY - PAP
Comment: NEGATIVE
Diagnosis: NEGATIVE
High risk HPV: NEGATIVE

## 2020-03-24 LAB — HM DEXA SCAN

## 2020-04-01 ENCOUNTER — Encounter: Payer: Self-pay | Admitting: Family Medicine

## 2020-04-08 DIAGNOSIS — M810 Age-related osteoporosis without current pathological fracture: Secondary | ICD-10-CM

## 2020-04-21 MED ORDER — DENOSUMAB 60 MG/ML ~~LOC~~ SOSY: 60.0000 mg | PREFILLED_SYRINGE | Freq: Once | SUBCUTANEOUS | 1 refills | Status: AC

## 2020-04-21 NOTE — Addendum Note (Signed)
Addended by: Steve Rattler A on: 04/21/2020 11:01 AM   Modules accepted: Orders

## 2020-04-21 NOTE — Telephone Encounter (Signed)
Scheduled patient for prolia shot next week. She will go to pharmacy downstairs to pick up and pay. She will then bring up the prolia for injection here.

## 2020-04-28 ENCOUNTER — Ambulatory Visit: Payer: Managed Care, Other (non HMO)

## 2020-04-30 NOTE — Telephone Encounter (Signed)
Fredric Mare -- do you think you could speak with [facility] pharmacy about this prolia card.  They also sent a fax on the 17th saying the contacted the insurance and insurance will not pay for this med and pt needs to switch to an alternative covered medication.

## 2020-05-04 ENCOUNTER — Telehealth: Payer: Self-pay

## 2020-05-04 NOTE — Telephone Encounter (Signed)
Spoke with pharmacy down stairs. They called patient's insurance. Even with the copay card and prior auth completed her insurance is denying the injection. She will need an alternative sent in to pharmacy. It doesn't look like shes tried anything orally anyway.Marland KitchenMarland KitchenMarland Kitchen

## 2020-05-04 NOTE — Telephone Encounter (Signed)
Fosamax 70 mg weekly #12 3 refills Recheck bmd 2 years

## 2020-05-05 MED ORDER — ALENDRONATE SODIUM 70 MG PO TABS: 70.0000 mg | ORAL_TABLET | ORAL | 3 refills | Status: DC

## 2020-05-05 NOTE — Telephone Encounter (Signed)
Spoke with patient. She is ok with sending in new medication. This has been sent to pharmacy.

## 2020-07-03 LAB — HM MAMMOGRAPHY

## 2020-07-17 ENCOUNTER — Ambulatory Visit: Payer: Managed Care, Other (non HMO) | Admitting: Orthopaedic Surgery

## 2020-07-24 ENCOUNTER — Ambulatory Visit: Payer: Self-pay

## 2020-07-24 ENCOUNTER — Ambulatory Visit (INDEPENDENT_AMBULATORY_CARE_PROVIDER_SITE_OTHER): Payer: Managed Care, Other (non HMO)

## 2020-07-24 ENCOUNTER — Ambulatory Visit (INDEPENDENT_AMBULATORY_CARE_PROVIDER_SITE_OTHER): Payer: Managed Care, Other (non HMO) | Admitting: Orthopaedic Surgery

## 2020-07-24 VITALS — BP 114/77 | HR 79

## 2020-07-24 DIAGNOSIS — Z981 Arthrodesis status: Secondary | ICD-10-CM | POA: Diagnosis not present

## 2020-07-24 DIAGNOSIS — M542 Cervicalgia: Secondary | ICD-10-CM

## 2020-07-24 DIAGNOSIS — M545 Low back pain, unspecified: Secondary | ICD-10-CM

## 2020-07-24 NOTE — Progress Notes (Signed)
Office Visit Note   Patient: Nicole Blackburn           Date of Birth: 07-Mar-1961           MRN: [id number] Visit Date: 07/24/2020              Requested by: [address] [place], [place] [address] [place],  [place] [address] PCP: Zola Button, Grayling Congress, DO   Assessment & Plan: Visit Diagnoses:  1. Acute bilateral low back pain, unspecified whether sciatica present   2. Neck pain     Plan: Discussed patient she might have pseudoarthrosis she can get it rex-rayed 6 months of her neck still giving her some symptoms.  She is proceeding with facet rhizotomy to see if this gives her some relief of her back pain that tends to radiate into her hips.  Return as needed.  Follow-Up Instructions: No follow-ups on file.   Orders:  Orders Placed This Encounter  Procedures  . XR Cervical Spine 2 or 3 views   No orders of the defined types were placed in this encounter.     Procedures: No procedures performed   Clinical Data: No additional findings.   Subjective: Chief Complaint  Patient presents with  . Lower Back - Follow-up    HPI 59 year old female returns with ongoing problems x6 months with back and neck pain.  Cervical fusion C5-6 C6-7 12/04/2019 with some improvement in her neck symptoms but persistent symptoms into her shoulders.  She has been using Aleve.  She states her back recently has bothered her more than her neck.  No chills or fever no myelopathic symptoms.  Pain in her neck radiates into her shoulder blades with rotation slightly more on the right than left arm.  Patient states [facility]  She had some facet injections and is scheduled for rhizotomy.  Lumbar curvature the right apex at the L2-3 level approximately 50 degrees with some lateral listhesis at L3-4 and foraminal narrowing on the right as expected with her curvature.  She states they have not discussed with her multilevel lumbar instrumented fusion at this  point.  Review of Systems 14 point systems update unchanged since March surgery other than as mentioned in HPI.   Objective: Vital Signs: BP 114/77   Pulse 79   LMP 02/24/2015   Physical Exam Constitutional:      Appearance: She is well-developed.  HENT:     Head: Normocephalic.     Right Ear: External ear normal.     Left Ear: External ear normal.  Eyes:     Pupils: Pupils are equal, round, and reactive to light.  Neck:     Thyroid: No thyromegaly.     Trachea: No tracheal deviation.  Cardiovascular:     Rate and Rhythm: Normal rate.  Pulmonary:     Effort: Pulmonary effort is normal.  Abdominal:     Palpations: Abdomen is soft.  Skin:    General: Skin is warm and dry.  Neurological:     Mental Status: She is alert and oriented to person, place, and time.  Psychiatric:        Behavior: Behavior normal.     Ortho Exam  Specialty Comments:  No specialty comments available.  Imaging: No results found.   PMFS History: Patient Active Problem List   Diagnosis Date Noted  . S/P cervical spinal fusion 12/11/2019  . Cervical spinal stenosis 12/04/2019  .  Other spondylosis with radiculopathy, cervical region 03/20/2019  . Dizzy 09/30/2018  . History of scoliosis 09/30/2018  . Right hip pain 09/30/2018  . Allergies 09/30/2018  . Rash 03/25/2018  . Fever 03/25/2018  . Muscle spasm 03/25/2018  . HTN (hypertension) 07/20/2015  . Chronic cough 12/11/2012  . Idiopathic scoliosis 12/11/2012  . Anemia    Past Medical History:  Diagnosis Date  . Abnormal pap 1997/1998   cryo  . Anemia   . HLD (hyperlipidemia)    diet controlled, no meds  . Hypertension    no meds currently    Family History  Problem Relation Age of Onset  . COPD Mother   . Osteoporosis Mother   . Pulmonary embolism Mother   . Diabetes Father   . Hypertension Father   . Colon polyps Father   . Hypertension Sister   . Hypertension Brother   . Stroke Maternal Grandmother 60  . Cancer  Paternal Grandmother 72       multiple myloma  . Cancer Paternal Grandfather        ? kind cancer    Past Surgical History:  Procedure Laterality Date  . ANTERIOR CERVICAL DECOMP/DISCECTOMY FUSION N/A 12/04/2019   Procedure: CERVICAL FIVE-SIX, CERVICAL SIX-SEVEN ANTERIOR CERVICAL DECOMPRESSION/DISCECTOMY FUSION ALLOGRAFT PLATE;  Surgeon: [name], MD;  Location: [facility] OR;  Service: Orthopedics;  Laterality: N/A;  . COLONOSCOPY    . REFRACTIVE SURGERY Bilateral 2012   Dr Nile Riggs  . UPPER GI ENDOSCOPY    . WISDOM TOOTH EXTRACTION  age 5's   Social History   Occupational History  . 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 Smoking status: Never Smoker  . Smokeless tobacco: Never Used  Vaping Use  . Vaping Use: Never used  Substance and Sexual Activity  . Alcohol use: Yes    Alcohol/week: 3.0 standard drinks    Types: 3 Standard drinks or equivalent per week    Comment: Beer/Wine  . Drug use: No  . Sexual activity: Yes    Partners: Male    Birth control/protection: Post-menopausal

## 2020-08-08 ENCOUNTER — Other Ambulatory Visit: Payer: Self-pay

## 2020-08-08 ENCOUNTER — Emergency Department (HOSPITAL_COMMUNITY)
Admission: EM | Admit: 2020-08-08 | Discharge: 2020-08-08 | Disposition: A | Discharge status: Home / Self Care | Payer: Managed Care, Other (non HMO) | Attending: Emergency Medicine | Admitting: Emergency Medicine

## 2020-08-08 ENCOUNTER — Ambulatory Visit (INDEPENDENT_AMBULATORY_CARE_PROVIDER_SITE_OTHER): Payer: Managed Care, Other (non HMO)

## 2020-08-08 ENCOUNTER — Emergency Department (HOSPITAL_COMMUNITY): Payer: Managed Care, Other (non HMO)

## 2020-08-08 ENCOUNTER — Ambulatory Visit (HOSPITAL_COMMUNITY): Admission: EM | Admit: 2020-08-08 | Discharge: 2020-08-08 | Payer: Managed Care, Other (non HMO)

## 2020-08-08 ENCOUNTER — Encounter (HOSPITAL_COMMUNITY): Payer: Self-pay | Admitting: Emergency Medicine

## 2020-08-08 DIAGNOSIS — S72001A Fracture of unspecified part of neck of right femur, initial encounter for closed fracture: Secondary | ICD-10-CM | POA: Diagnosis not present

## 2020-08-08 DIAGNOSIS — Y92009 Unspecified place in unspecified non-institutional (private) residence as the place of occurrence of the external cause: Secondary | ICD-10-CM | POA: Insufficient documentation

## 2020-08-08 DIAGNOSIS — Z20822 Contact with and (suspected) exposure to covid-19: Secondary | ICD-10-CM | POA: Insufficient documentation

## 2020-08-08 DIAGNOSIS — W19XXXA Unspecified fall, initial encounter: Secondary | ICD-10-CM | POA: Diagnosis not present

## 2020-08-08 DIAGNOSIS — I10 Essential (primary) hypertension: Secondary | ICD-10-CM | POA: Diagnosis not present

## 2020-08-08 DIAGNOSIS — M25551 Pain in right hip: Secondary | ICD-10-CM

## 2020-08-08 DIAGNOSIS — Y9339 Activity, other involving climbing, rappelling and jumping off: Secondary | ICD-10-CM | POA: Insufficient documentation

## 2020-08-08 DIAGNOSIS — Y30XXXA Falling, jumping or pushed from a high place, undetermined intent, initial encounter: Secondary | ICD-10-CM | POA: Diagnosis not present

## 2020-08-08 DIAGNOSIS — M2560 Stiffness of unspecified joint, not elsewhere classified: Secondary | ICD-10-CM | POA: Diagnosis not present

## 2020-08-08 HISTORY — DX: Scoliosis, unspecified: M41.9

## 2020-08-08 LAB — PROTIME-INR
INR: 1.1 (ref 0.8–1.2)
Prothrombin Time: 13.7 seconds (ref 11.4–15.2)

## 2020-08-08 LAB — BASIC METABOLIC PANEL
Anion gap: 11 (ref 5–15)
BUN: 19 mg/dL (ref 6–20)
CO2: 24 mmol/L (ref 22–32)
Calcium: 9.2 mg/dL (ref 8.9–10.3)
Chloride: 105 mmol/L (ref 98–111)
Creatinine, Ser: 0.69 mg/dL (ref 0.44–1.00)
GFR, Estimated: >60 mL/min (ref >60)
Glucose, Bld: 89 mg/dL (ref 70–99)
Potassium: 4.2 mmol/L (ref 3.5–5.1)
Sodium: 140 mmol/L (ref 135–145)

## 2020-08-08 LAB — CBC WITH DIFFERENTIAL/PLATELET
Abs Immature Granulocytes: 0.02 10*3/uL (ref 0.00–0.07)
Basophils Absolute: 0.1 10*3/uL (ref 0.0–0.1)
Basophils Relative: 1 %
Eosinophils Absolute: 0.1 10*3/uL (ref 0.0–0.5)
Eosinophils Relative: 2 %
HCT: 36.9 % (ref 36.0–46.0)
Hemoglobin: 11.4 g/dL — ABNORMAL LOW (ref 12.0–15.0)
Immature Granulocytes: 0 %
Lymphocytes Relative: 22 %
Lymphs Abs: 1.7 10*3/uL (ref 0.7–4.0)
MCH: 28.7 pg (ref 26.0–34.0)
MCHC: 30.9 g/dL (ref 30.0–36.0)
MCV: 92.9 fL (ref 80.0–100.0)
Monocytes Absolute: 0.5 10*3/uL (ref 0.1–1.0)
Monocytes Relative: 7 %
Neutro Abs: 5.2 10*3/uL (ref 1.7–7.7)
Neutrophils Relative %: 68 %
Platelets: 300 10*3/uL (ref 150–400)
RBC: 3.97 MIL/uL (ref 3.87–5.11)
RDW: 13.6 % (ref 11.5–15.5)
WBC: 7.6 10*3/uL (ref 4.0–10.5)
nRBC: 0 % (ref 0.0–0.2)

## 2020-08-08 LAB — RESP PANEL BY RT-PCR (FLU A&B, COVID) ARPGX2
Influenza A by PCR: NEGATIVE
Influenza B by PCR: NEGATIVE
SARS Coronavirus 2 by RT PCR: NEGATIVE

## 2020-08-08 LAB — TYPE AND SCREEN
ABO/RH(D): A POS
Antibody Screen: NEGATIVE

## 2020-08-08 MED ORDER — OXYCODONE-ACETAMINOPHEN 5-325 MG PO TABS
1.0000 | ORAL_TABLET | Freq: Four times a day (QID) | ORAL | 0 refills | Status: DC | PRN
Start: 2020-08-08 — End: 2020-08-20

## 2020-08-08 MED ORDER — IBUPROFEN 600 MG PO TABS: 600.0000 mg | ORAL_TABLET | Freq: Four times a day (QID) | ORAL | 0 refills | Status: DC | PRN

## 2020-08-08 MED ORDER — OXYCODONE-ACETAMINOPHEN 5-325 MG PO TABS
2.0000 | ORAL_TABLET | Freq: Once | ORAL | Status: AC
  Administered 2020-08-08: 2 via ORAL
  Filled 2020-08-08: qty 2

## 2020-08-08 MED ORDER — IBUPROFEN 400 MG PO TABS
600.0000 mg | ORAL_TABLET | Freq: Once | ORAL | Status: AC
  Administered 2020-08-08: 600 mg via ORAL
  Filled 2020-08-08: qty 1

## 2020-08-08 NOTE — ED Triage Notes (Addendum)
Pt states she was sent from [facility] for R hip fracture.  States she was out of town and fell on Tuesday or Wednesday while stepping over a baby gate at the bottom of stairs.  States she has no pain while sitting.  Pain in bilateral hips when ambulating.

## 2020-08-08 NOTE — Progress Notes (Signed)
Orthopedic Tech Progress Note Patient Details:  Nicole Blackburn 1961/08/16 [id number]  Ortho Devices Type of Ortho Device: Crutches Ortho Device/Splint Interventions: Ordered, Application, Adjustment   Post Interventions Patient Tolerated: Well Instructions Provided: Care of device, Adjustment of device   Trinna Post 08/08/2020, 10:57 PM

## 2020-08-08 NOTE — ED Provider Notes (Signed)
[facility]   [id number] 08/08/20 Arrival Time: 1109  NW:GNFAO PAIN  SUBJECTIVE: History from: patient. Nicole Blackburn is a 59 y.o. female complains of right hip pain since she fell over a baby gate 2 days ago. Reports that she has chronic bilateral hip pain. Reports that she has been taking Aleve twice a day. Reports that this relieves the pain just a little, but does not alleviate the pain for very long. Reports that she fell onto her right side, and is having sharp pains in the right hip. Symptoms are made worse with activity. Denies similar symptoms in the past. Denies fever, chills, erythema, ecchymosis, effusion, weakness, numbness and tingling, saddle paresthesias, loss of bowel or bladder function.      ROS: As per HPI.  All other pertinent ROS negative.     Past Medical History:  Diagnosis Date  . Abnormal pap 1997/1998   cryo  . Anemia   . HLD (hyperlipidemia)    diet controlled, no meds  . Hypertension    no meds currently   Past Surgical History:  Procedure Laterality Date  . ANTERIOR CERVICAL DECOMP/DISCECTOMY FUSION N/A 12/04/2019   Procedure: CERVICAL FIVE-SIX, CERVICAL SIX-SEVEN ANTERIOR CERVICAL DECOMPRESSION/DISCECTOMY FUSION ALLOGRAFT PLATE;  Surgeon: [name], MD;  Location: [facility] OR;  Service: Orthopedics;  Laterality: N/A;  . COLONOSCOPY    . REFRACTIVE SURGERY Bilateral 2012   Dr Nile Riggs  . UPPER GI ENDOSCOPY    . WISDOM TOOTH EXTRACTION  age 14's   No Known Allergies No current facility-administered medications on file prior to encounter.   Current Outpatient Medications on File Prior to Encounter  Medication Sig Dispense Refill  . Multiple Vitamins-Minerals (MULTIVITAMIN PO) Take 1 tablet by mouth daily.     . naproxen sodium (ALEVE) 220 MG tablet Take 220 mg by mouth.    . psyllium (METAMUCIL) 0.52 G capsule Take 0.52 g by mouth daily.    Marland Kitchen alendronate (FOSAMAX) 70 MG tablet Take 1 tablet (70 mg total) by mouth every 7 (seven) days. Take with  a full glass of water on an empty stomach. 12 tablet 3  . Calcium Carbonate-Vit D-Min (CALCIUM 1200 PO) Take 1 tablet by mouth 3 (three) times a week.     . loratadine (CLARITIN) 10 MG tablet Take 10 mg by mouth daily.     Social History   Socioeconomic History  . Marital status: Married    Spouse name: Not on file  . Number of children: Not on file  . Years of education: Not on file  . Highest education level: Not on file  Occupational History  . 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 Smoking status: Never Smoker  . Smokeless tobacco: Never Used  Vaping Use  . Vaping Use: Never used  Substance and Sexual Activity  . Alcohol use: Yes    Alcohol/week: 3.0 standard drinks    Types: 3 Standard drinks or equivalent per week    Comment: Beer/Wine  . Drug use: No  . Sexual activity: Yes    Partners: Male    Birth control/protection: Post-menopausal  Other Topics Concern  . Not on file  Social History Narrative   Exercise-- elliptical   Social Determinants of Health   Financial Resource Strain:   . Difficulty of Paying Living Expenses: Not on file  Food Insecurity:   . Worried About Programme researcher, broadcasting/film/video in the Last Year: Not on file  . Ran Out of Food in the Last Year:  Not on file  Transportation Needs:   . Lack of Transportation (Medical): Not on file  . Lack of Transportation (Non-Medical): Not on file  Physical Activity:   . Days of Exercise per Week: Not on file  . Minutes of Exercise per Session: Not on file  Stress:   . Feeling of Stress : Not on file  Social Connections:   . Frequency of Communication with Friends and Family: Not on file  . Frequency of Social Gatherings with Friends and Family: Not on file  . Attends Religious Services: Not on file  . Active Member of Clubs or Organizations: Not on file  . Attends Banker Meetings: Not on file  . Marital Status: Not on file  Intimate Partner Violence:   . Fear of Current or  Ex-Partner: Not on file  . Emotionally Abused: Not on file  . Physically Abused: Not on file  . Sexually Abused: Not on file   Family History  Problem Relation Age of Onset  . COPD Mother   . Osteoporosis Mother   . Pulmonary embolism Mother   . Diabetes Father   . Hypertension Father   . Colon polyps Father   . Hypertension Sister   . Hypertension Brother   . Stroke Maternal Grandmother 60  . Cancer Paternal Grandmother 49       multiple myloma  . Cancer Paternal Grandfather        ? kind cancer    OBJECTIVE:  Vitals:   08/08/20 1151  BP: 118/69  Pulse: 76  Resp: 18  Temp: 99.1 F (37.3 C)  TempSrc: Oral  SpO2: 100%    General appearance: ALERT; in no acute distress.  Head: NCAT Lungs: Normal respiratory effort CV:  pulses 2+ bilaterally. Cap refill < 2 seconds Musculoskeletal:  Inspection: Skin warm, dry, clear and intact Palpation: Anteior and lateral aspects of right hip tender to palpation ROM: Limited ROM active and passive to right hip Skin: warm and dry Neurologic: Ambulates without difficulty; Sensation intact about the upper/ lower extremities Psychological: alert and cooperative; normal mood and affect  DIAGNOSTIC STUDIES:  DG Hip Unilat With Pelvis 2-3 Views Right  Result Date: 08/08/2020 CLINICAL DATA:  Limited range of motion.  Fall.  Right hip pain. EXAM: DG HIP (WITH OR WITHOUT PELVIS) 2-3V RIGHT COMPARISON:  None. FINDINGS: Subtle deformity of the right hip is identified with a focal area of cortical disruption noted along the lateral aspect of the subcapital femoral neck. No dislocation identified. IMPRESSION: Findings suspicious for nondisplaced subcapital femoral neck fracture. Recommend more definitive characterization with CT of the right hip. Electronically Signed   By: Signa Kell M.D.   On: 08/08/2020 12:55     ASSESSMENT & PLAN:  1. Closed fracture of neck of right femur, initial encounter (HCC)   2. Fall   3. Right hip pain   4.  Limited joint range of motion (ROM)   5. Fall, initial encounter     X-ray showed closed fracture of the neck of the right femur Called Dr. Lajoyce Corners who is on-call for orthopedics, he recommends that she go to the ER as this will need a surgical repair and he will be in touch with surgeon who will perform this for her Patient wheeled to private vehicle Husband states he will take her to the ER and we will her in there as well Stable at discharge   Moshe Cipro, NP 08/08/20 1338

## 2020-08-08 NOTE — Discharge Instructions (Addendum)
1.  Try to avoid weightbearing on the right leg.  You may use toe-touch for assistance and balance and walking.  Use a walker or crutches. 2.  You may take ibuprofen 600 mg every 8 hours for pain.  Do not overlap with any Aleve\naproxen.  If this is not adequate for pain, you may take 1-2 Percocet every 6 hours. 3.  Schedule a follow-up appointment with Dr. Roda Shutters as soon as possible

## 2020-08-08 NOTE — Progress Notes (Signed)
I reviewed and discussed xray and CT findings with the patient and Dr. Donnald Garre over the phone.  She has a subtle cortical irregularity of the subcapital femoral neck.  The fracture is incomplete.  The patient has been ambulating and weight bearing on the affected leg for the last 3-4 days and the pain has been manageable.  She mainly complains of pain and stiffness after a period of immobility.  I have recommended nonop treatment with TDWB of the RLE with a walker and PT evaluation for safe mobilization.  If the fracture somehow completes or displaces then we could consider surgical fixation.  Overall, I feel that this fracture is inherently stable and should do fine with nonop treatment.  If patient fails mobility eval by PT then she can be admitted to medicine for pain control and additional PT sessions.  All parties in agreement with the plan.  All questions answered.  She may follow up in the office in 1-2 weeks after hospital discharge.    Mayra Reel, MD [facility] [phone] 4:52 PM

## 2020-08-08 NOTE — ED Provider Notes (Addendum)
Patient has CT scan with nondisplaced, subtle impacted hip fracture.  Dr. Roda Shutters has reviewed x-ray and CT.  He advises that this can be a nonoperative management.  Place consultation to physical therapy for mobility assessment with walker with allowed toe-touch on right. Physical Exam  BP 129/84   Pulse 70   Temp 98.2 F (36.8 C) (Oral)   Resp (!) 23   Ht 4\' 11"  (1.499 m)   Wt 48.1 kg   LMP 02/24/2015   SpO2 97%   BMI 21.41 kg/m   Physical Exam  ED Course/Procedures     Procedures  MDM  Patient is alert.  No distress at rest.  Will provide Percocet and ibuprofen for pain.  Patient may eat.  Will await PT assessment with walker to determine final disposition.  If patient fails PT mobility testing, plan for admission to hospitalist service and Dr. 02/26/2015 will continue to consult for management.         Roda Shutters, MD 08/08/20 1634 PT is not available for evaluation tonight.  Patient has been able to ambulate with pain controlled after getting ibuprofen and Percocet.  At this time, she feels comfortable going home with crutches and prescription for walker.  Will continue with pain control, toe-touch walking and close follow-up with Dr. 08/10/20, MD 08/08/20 2122

## 2020-08-08 NOTE — ED Notes (Signed)
Patient is being discharged from the Urgent Care and sent to the Emergency Department via personal vehicle with spouse. Per provider Moshe Cipro, patient is in need of higher level of care due to fractured hip. Patient is aware and verbalizes understanding of plan of care.   Vitals:   08/08/20 1151  BP: 118/69  Pulse: 76  Resp: 18  Temp: 99.1 F (37.3 C)  SpO2: 100%

## 2020-08-08 NOTE — ED Notes (Signed)
The pt walked to the br with littile or no assistance

## 2020-08-08 NOTE — ED Provider Notes (Signed)
[facility] EMERGENCY DEPARTMENT Provider Note   CSN: [id number] Arrival date & time: 08/08/20  1352     History Chief Complaint  Patient presents with  . hip fracture    Nicole Blackburn is a 59 y.o. female.  HPI 59 year old female presents today complaining of right hip pain after fall 3 to 4 days ago.  She states she was at her parents house and coming down the steps.  She jumped over a gate and fell landing on her right hip.  She denies any other injury.  She has had pain in the right hip since the fall which is increased with ambulation but she has been able to walk.  States she normally has bilateral hip pain thought to be due to scoliosis.  She is on medicine for low bone density.  She normally only takes an ibuprofen a day for the hip pain.  She did not strike her head, have neck pain or injury, have weakness, lateralized numbness or tingling, loss or bowel or bladder control.  She has no prior history of fracture.  She was initially seen in urgent care center and had x-rays obtained which are suspicious for subcapital femoral neck fracture without dislocation.  Patient's care was discussed by urgent care provider with Dr. Lajoyce Corners prior to sending patient to ED.  Patient's last p.o. intake was Ensure at breakfast.   She has received 2 Covid vaccines with the second 1 in April Past Medical History:  Diagnosis Date  . Abnormal pap 1997/1998   cryo  . Anemia   . HLD (hyperlipidemia)    diet controlled, no meds  . Hypertension    no meds currently  . Scoliosis     Patient Active Problem List   Diagnosis Date Noted  . S/P cervical spinal fusion 12/11/2019  . Cervical spinal stenosis 12/04/2019  . Other spondylosis with radiculopathy, cervical region 03/20/2019  . Dizzy 09/30/2018  . History of scoliosis 09/30/2018  . Right hip pain 09/30/2018  . Allergies 09/30/2018  . Rash 03/25/2018  . Fever 03/25/2018  . Muscle spasm 03/25/2018  . HTN (hypertension)  07/20/2015  . Chronic cough 12/11/2012  . Idiopathic scoliosis 12/11/2012  . Anemia     Past Surgical History:  Procedure Laterality Date  . ANTERIOR CERVICAL DECOMP/DISCECTOMY FUSION N/A 12/04/2019   Procedure: CERVICAL FIVE-SIX, CERVICAL SIX-SEVEN ANTERIOR CERVICAL DECOMPRESSION/DISCECTOMY FUSION ALLOGRAFT PLATE;  Surgeon: [name], MD;  Location: [facility] OR;  Service: Orthopedics;  Laterality: N/A;  . COLONOSCOPY    . REFRACTIVE SURGERY Bilateral 2012   Dr Nile Riggs  . UPPER GI ENDOSCOPY    . WISDOM TOOTH EXTRACTION  age 43's     OB History    Gravida  2   Para  1   Term      Preterm      AB  1   Living  1     SAB  1   TAB      Ectopic      Multiple      Live Births  1           Family History  Problem Relation Age of Onset  . COPD Mother   . Osteoporosis Mother   . Pulmonary embolism Mother   . Diabetes Father   . Hypertension Father   . Colon polyps Father   . Hypertension Sister   . Hypertension Brother   . Stroke Maternal Grandmother 60  . Cancer Paternal Grandmother 70  multiple myloma  . Cancer Paternal Grandfather        ? kind cancer    Social History   Tobacco Use  . Smoking status: Never Smoker  . Smokeless tobacco: Never Used  Vaping Use  . Vaping Use: Never used  Substance Use Topics  . Alcohol use: Yes    Alcohol/week: 3.0 standard drinks    Types: 3 Standard drinks or equivalent per week    Comment: Beer/Wine  . Drug use: No    Home Medications Prior to Admission medications   Medication Sig Start Date End Date Taking? Authorizing Provider  alendronate (FOSAMAX) 70 MG tablet Take 1 tablet (70 mg total) by mouth every 7 (seven) days. Take with a full glass of water on an empty stomach. 05/05/20   [name], DO  Calcium Carbonate-Vit D-Min (CALCIUM 1200 PO) Take 1 tablet by mouth 3 (three) times a week.     [provider]  loratadine (CLARITIN) 10 MG tablet Take 10 mg by mouth daily.    [provider]  Multiple Vitamins-Minerals (MULTIVITAMIN PO) Take 1 tablet by mouth daily.     [provider]  naproxen sodium (ALEVE) 220 MG tablet Take 220 mg by mouth.    [provider]  psyllium (METAMUCIL) 0.52 G capsule Take 0.52 g by mouth daily.    [provider]    Allergies    Patient has no known allergies.  Review of Systems   Review of Systems  All other systems reviewed and are negative.   Physical Exam Updated Vital Signs BP (!) 152/88 (BP Location: Right Arm)   Pulse 73   Temp 98.2 F (36.8 C) (Oral)   Resp 18   LMP 02/24/2015   SpO2 100%   Physical Exam Vitals and nursing note reviewed.  Constitutional:      General: She is not in acute distress.    Appearance: Normal appearance. She is not ill-appearing.  HENT:     Head: Normocephalic and atraumatic.     Right Ear: External ear normal.     Left Ear: External ear normal.     Nose: Nose normal.  Eyes:     Pupils: Pupils are equal, round, and reactive to light.  Cardiovascular:     Rate and Rhythm: Normal rate and regular rhythm.     Pulses: Normal pulses.  Pulmonary:     Effort: Pulmonary effort is normal.     Breath sounds: Normal breath sounds.  Musculoskeletal:        General: Normal range of motion.     Cervical back: Normal range of motion.     Comments: Right lower extremity without obvious signs of trauma Pulses intact Sensation intact Tenderness over right lateral hip and palpation in groin. No tenderness over distal femur, knee, lower leg or foot. No trauma to other extremities noted No trauma to cervical, thoracic, or lumbar spine noted on exam  Skin:    General: Skin is warm and dry.     Capillary Refill: Capillary refill takes less than 2 seconds.  Neurological:     General: No focal deficit present.     Mental Status: She is alert and oriented to person, place, and time.     Sensory: No sensory deficit.     Motor: No weakness.     Deep Tendon  Reflexes: Reflexes normal.  Psychiatric:        Mood and Affect: Mood normal.  Behavior: Behavior normal.     ED Results / Procedures / Treatments   Labs (all labs ordered are listed, but only abnormal results are displayed) Labs Reviewed  BASIC METABOLIC PANEL  CBC WITH DIFFERENTIAL/PLATELET  PROTIME-INR  TYPE AND SCREEN    EKG None  Radiology DG Hip Unilat With Pelvis 2-3 Views Right  Result Date: 08/08/2020 CLINICAL DATA:  Limited range of motion.  Fall.  Right hip pain. EXAM: DG HIP (WITH OR WITHOUT PELVIS) 2-3V RIGHT COMPARISON:  None. FINDINGS: Subtle deformity of the right hip is identified with a focal area of cortical disruption noted along the lateral aspect of the subcapital femoral neck. No dislocation identified. IMPRESSION: Findings suspicious for nondisplaced subcapital femoral neck fracture. Recommend more definitive characterization with CT of the right hip. Electronically Signed   By: Signa Kell M.D.   On: 08/08/2020 12:55    Procedures Procedures (including critical care time)  Medications Ordered in ED Medications - No data to display  ED Course  I have reviewed the triage vital signs and the nursing notes.  Pertinent labs & imaging results that were available during my care of the patient were reviewed by me and considered in my medical decision making (see chart for details).    MDM Rules/Calculators/A&P                          Patient presents from urgent care with pain in right hip after fall several days ago.  Plain x-Lux [place] equivocal.  Patient has been ambulating on this hip.  CT ordered.  Discussed with Dr. Lajoyce Corners.  If positive will call him back and have patient admitted to medicine. Discussed with Dr. Donnald Garre who will dispo after study  Final Clinical Impression(s) / ED Diagnoses Final diagnoses:  Right hip pain    Rx / DC Orders ED Discharge Orders    None       Margarita Grizzle, MD 08/10/20 1316

## 2020-08-08 NOTE — ED Notes (Signed)
Paged ortho for crutches 

## 2020-08-08 NOTE — ED Notes (Signed)
Dr. Donnald Garre to speak with patient due to PT not being available until the AM.

## 2020-08-08 NOTE — ED Triage Notes (Signed)
Patient fell 3-4 days ago.  Patient tried to jump over a baby gate and fell. Patient landed on ceramic tile flooring.  Pain in both hips and particularly noticed with walking.  Patient has a history of scoliosis and has chronic pain issues, but hips are worse than usual.

## 2020-08-08 NOTE — Discharge Instructions (Addendum)
You have a fracture of your hip.   I have spoken with Dr Lajoyce Corners with orthopedics and he recommends that you go to the ER as this will need a surgical repair

## 2020-08-10 ENCOUNTER — Encounter: Payer: Self-pay | Admitting: Family Medicine

## 2020-08-11 MED ORDER — ALENDRONATE SODIUM 70 MG PO TABS
70.0000 mg | ORAL_TABLET | ORAL | 3 refills | Status: DC
Start: 2020-08-11 — End: 2021-06-21

## 2020-08-12 ENCOUNTER — Other Ambulatory Visit: Payer: Self-pay

## 2020-08-12 ENCOUNTER — Ambulatory Visit (INDEPENDENT_AMBULATORY_CARE_PROVIDER_SITE_OTHER): Payer: Managed Care, Other (non HMO) | Admitting: Orthopaedic Surgery

## 2020-08-12 ENCOUNTER — Encounter: Payer: Self-pay | Admitting: Orthopaedic Surgery

## 2020-08-12 DIAGNOSIS — S72009A Fracture of unspecified part of neck of unspecified femur, initial encounter for closed fracture: Secondary | ICD-10-CM | POA: Insufficient documentation

## 2020-08-12 DIAGNOSIS — S72001A Fracture of unspecified part of neck of right femur, initial encounter for closed fracture: Secondary | ICD-10-CM | POA: Diagnosis not present

## 2020-08-12 NOTE — Progress Notes (Signed)
Office Visit Note   Patient: Nicole Blackburn           Date of Birth: [birth year]           MRN: [id number] Visit Date: 08/12/2020              Requested by: [address], [place], [place] [address] [place],  [place] [address] PCP: Zola Button, Grayling Congress, DO   Assessment & Plan: Visit Diagnoses:  1. Closed fracture of neck of right femur, initial encounter (HCC)     Plan: CT scan plain x-rays reviewed with patient she is 1 week out from her fracture. She has a subtle fracture essentially nondisplaced she does not have any significant hip arthritis and nonoperative treatment is recommended with continued nonweightbearing. When she is standing she can just touch her foot down. She has the ability to work from home work slip given for work from home x6 weeks. Recheck 5 weeks and repeat x-ray supine AP frog-leg right hip on return.  Follow-Up Instructions: Return in about 5 weeks (around 09/16/2020).   Orders:  No orders of the defined types were placed in this encounter.  No orders of the defined types were placed in this encounter.     Procedures: No procedures performed   Clinical Data: No additional findings.   Subjective: Chief Complaint  Patient presents with  . Right Hip - Fracture    Fall 08/05/2020    HPI 59 year old female with previous two-level cervical fusion by me in March of this year was visiting family over the Thanksgiving holiday tripped and fell with ability to walk but significant right groin pain. Eventually she was seen in emergency room where CT scan was performed which showed a subtle impaction fracture of the junction of the right femoral neck and head junction. She has been placed on crutches and is nonweightbearing. Patient does have significant scoliosis. Date of fall was 08/05/2020.  Review of Systems patient's been on Fosamax for 3 months for osteoporosis. Previous cervical fusion to level as above.   Objective: Vital Signs: BP 140/79    Pulse 67   Ht 4\' 11"  (1.499 m)   Wt 106 lb (48.1 kg)   LMP 02/24/2015   BMI 21.41 kg/m   Physical Exam Constitutional:      Appearance: She is well-developed.  HENT:     Head: Normocephalic.     Right Ear: External ear normal.     Left Ear: External ear normal.  Eyes:     Pupils: Pupils are equal, round, and reactive to light.  Neck:     Thyroid: No thyromegaly.     Trachea: No tracheal deviation.  Cardiovascular:     Rate and Rhythm: Normal rate.  Pulmonary:     Effort: Pulmonary effort is normal.  Abdominal:     Palpations: Abdomen is soft.  Skin:    General: Skin is warm and dry.  Neurological:     Mental Status: She is alert and oriented to person, place, and time.  Psychiatric:        Behavior: Behavior normal.     Ortho Exam station the foot is intact she has right groin pain with internal rotation 20degrees consistent with her nondisplaced femoral neck fracture.  Specialty Comments:  No specialty comments available.  Imaging: No results found.   PMFS History: Patient Active Problem List   Diagnosis Date Noted  . Femoral neck fracture (HCC) 08/12/2020  . S/P cervical spinal  fusion 12/11/2019  . Cervical spinal stenosis 12/04/2019  . Other spondylosis with radiculopathy, cervical region 03/20/2019  . Dizzy 09/30/2018  . History of scoliosis 09/30/2018  . Right hip pain 09/30/2018  . Allergies 09/30/2018  . Rash 03/25/2018  . Fever 03/25/2018  . Muscle spasm 03/25/2018  . HTN (hypertension) 07/20/2015  . Chronic cough 12/11/2012  . Idiopathic scoliosis 12/11/2012  . Anemia    Past Medical History:  Diagnosis Date  . Abnormal pap 1997/1998   cryo  . Anemia   . HLD (hyperlipidemia)    diet controlled, no meds  . Hypertension    no meds currently  . Scoliosis     Family History  Problem Relation Age of Onset  . COPD Mother   . Osteoporosis Mother   . Pulmonary embolism Mother   . Diabetes Father   . Hypertension Father   . Colon polyps  Father   . Hypertension Sister   . Hypertension Brother   . Stroke Maternal Grandmother 60  . Cancer Paternal Grandmother 32       multiple myloma  . Cancer Paternal Grandfather        ? kind cancer    Past Surgical History:  Procedure Laterality Date  . ANTERIOR CERVICAL DECOMP/DISCECTOMY FUSION N/A 12/04/2019   Procedure: CERVICAL FIVE-SIX, CERVICAL SIX-SEVEN ANTERIOR CERVICAL DECOMPRESSION/DISCECTOMY FUSION ALLOGRAFT PLATE;  Surgeon: [name], MD;  Location: [facility] OR;  Service: Orthopedics;  Laterality: N/A;  . COLONOSCOPY    . REFRACTIVE SURGERY Bilateral 2012   Dr Nile Riggs  . UPPER GI ENDOSCOPY    . WISDOM TOOTH EXTRACTION  age 80's   Social History   Occupational History  . 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 Smoking status: Never Smoker  . Smokeless tobacco: Never Used  Vaping Use  . Vaping Use: Never used  Substance and Sexual Activity  . Alcohol use: Yes    Alcohol/week: 3.0 standard drinks    Types: 3 Standard drinks or equivalent per week    Comment: Beer/Wine  . Drug use: No  . Sexual activity: Yes    Partners: Male    Birth control/protection: Post-menopausal

## 2020-08-20 ENCOUNTER — Other Ambulatory Visit: Payer: Self-pay | Admitting: Orthopaedic Surgery

## 2020-08-20 ENCOUNTER — Encounter: Payer: Self-pay | Admitting: Orthopaedic Surgery

## 2020-08-20 MED ORDER — OXYCODONE-ACETAMINOPHEN 5-325 MG PO TABS
1.0000 | ORAL_TABLET | Freq: Four times a day (QID) | ORAL | 0 refills | Status: DC | PRN
Start: 2020-08-20 — End: 2021-03-01

## 2020-08-20 MED ORDER — IBUPROFEN 600 MG PO TABS
600.0000 mg | ORAL_TABLET | Freq: Four times a day (QID) | ORAL | 0 refills | Status: DC | PRN
Start: 2020-08-20 — End: 2021-03-01

## 2020-09-16 ENCOUNTER — Ambulatory Visit (INDEPENDENT_AMBULATORY_CARE_PROVIDER_SITE_OTHER): Payer: Managed Care, Other (non HMO)

## 2020-09-16 ENCOUNTER — Ambulatory Visit (INDEPENDENT_AMBULATORY_CARE_PROVIDER_SITE_OTHER): Payer: Managed Care, Other (non HMO) | Admitting: Orthopaedic Surgery

## 2020-09-16 ENCOUNTER — Encounter: Payer: Self-pay | Admitting: Orthopaedic Surgery

## 2020-09-16 VITALS — BP 115/70 | HR 83 | Ht 59.0 in | Wt 104.0 lb

## 2020-09-16 DIAGNOSIS — S72001A Fracture of unspecified part of neck of right femur, initial encounter for closed fracture: Secondary | ICD-10-CM

## 2020-09-16 NOTE — Progress Notes (Signed)
Office Visit Note   Patient: Nicole Blackburn           Date of Birth: Aug 19, 1961           MRN: [id number] Visit Date: 09/16/2020              Requested by: [address], [place], [place] [address] [place],  [place] [address] PCP: Zola Button, Grayling Congress, DO   Assessment & Plan: Visit Diagnoses:  1. Closed fracture of neck of right femur, initial encounter (HCC)     Plan: Patient can begin weightbearing 50% gradually progress off crutches.  Recheck 6 weeks.  Previous x-rays did not show significant arthritis in her left hip and she may have some synovitis in the left hip or tendinitis related to nonweightbearing on the right side for more than 6 weeks.  We will make a decision about reimaging on return visit in 6 weeks.  Follow-Up Instructions: Return in about 6 weeks (around 10/28/2020).   Orders:  Orders Placed This Encounter  Procedures  . XR HIP UNILAT W OR W/O PELVIS 2-3 VIEWS RIGHT   No orders of the defined types were placed in this encounter.     Procedures: No procedures performed   Clinical Data: No additional findings.   Subjective: Chief Complaint  Patient presents with  . Right Hip - Fracture, Follow-up    Fall 08/05/2020    HPI 60 year old female with osteoporosis on Fosamax x3 months with fall 08/05/2020 nondisplaced femoral neck fracture.  She has been nonweightbearing with crutches.  Some increased pain in her left groin.  She is using Motrin with some relief.  She is working from home.  Review of Systems all other systems are negative she does have osteoporosis scoliosis.  Previous cervical fusion some increase in shoulder discomfort using crutches for the last 7 weeks.   Objective: Vital Signs: BP 115/70   Pulse 83   Ht 4\' 11"  (1.499 m)   Wt 104 lb (47.2 kg)   LMP 02/24/2015   BMI 21.01 kg/m   Physical Exam Constitutional:      Appearance: She is well-developed.  HENT:     Head: Normocephalic.     Right Ear: External ear normal.      Left Ear: External ear normal.  Eyes:     Pupils: Pupils are equal, round, and reactive to light.  Neck:     Thyroid: No thyromegaly.     Trachea: No tracheal deviation.  Cardiovascular:     Rate and Rhythm: Normal rate.  Pulmonary:     Effort: Pulmonary effort is normal.  Abdominal:     Palpations: Abdomen is soft.  Skin:    General: Skin is warm and dry.  Neurological:     Mental Status: She is alert and oriented to person, place, and time.  Psychiatric:        Mood and Affect: Mood and affect normal.        Behavior: Behavior normal.     Ortho Exam mild groin pain with internal/external rotation left more than right hip.  When she stands she is not having any pain in the right groin or thigh. Specialty Comments:  No specialty comments available.  Imaging: XR HIP UNILAT W OR W/O PELVIS 2-3 VIEWS RIGHT  Result Date: 09/16/2020 X-rays show callus formation right hip AP frog-leg views that were obtained and reviewed.  Oblique femoral neck fracture nondisplaced shows callus formation medial lateral cortex.  No change  in position. Impression: Interval healing right femoral neck fracture nondisplaced.    PMFS History: Patient Active Problem List   Diagnosis Date Noted  . Femoral neck fracture (HCC) 08/12/2020  . S/P cervical spinal fusion 12/11/2019  . Cervical spinal stenosis 12/04/2019  . Other spondylosis with radiculopathy, cervical region 03/20/2019  . Dizzy 09/30/2018  . History of scoliosis 09/30/2018  . Right hip pain 09/30/2018  . Allergies 09/30/2018  . Rash 03/25/2018  . Fever 03/25/2018  . Muscle spasm 03/25/2018  . HTN (hypertension) 07/20/2015  . Chronic cough 12/11/2012  . Idiopathic scoliosis 12/11/2012  . Anemia    Past Medical History:  Diagnosis Date  . Abnormal pap 1997/1998   cryo  . Anemia   . HLD (hyperlipidemia)    diet controlled, no meds  . Hypertension    no meds currently  . Scoliosis     Family History  Problem Relation Age  of Onset  . COPD Mother   . Osteoporosis Mother   . Pulmonary embolism Mother   . Diabetes Father   . Hypertension Father   . Colon polyps Father   . Hypertension Sister   . Hypertension Brother   . Stroke Maternal Grandmother 60  . Cancer Paternal Grandmother 32       multiple myloma  . Cancer Paternal Grandfather        ? kind cancer    Past Surgical History:  Procedure Laterality Date  . ANTERIOR CERVICAL DECOMP/DISCECTOMY FUSION N/A 12/04/2019   Procedure: CERVICAL FIVE-SIX, CERVICAL SIX-SEVEN ANTERIOR CERVICAL DECOMPRESSION/DISCECTOMY FUSION ALLOGRAFT PLATE;  Surgeon: [name], MD;  Location: [facility] OR;  Service: Orthopedics;  Laterality: N/A;  . COLONOSCOPY    . REFRACTIVE SURGERY Bilateral 2012   Dr Nile Riggs  . UPPER GI ENDOSCOPY    . WISDOM TOOTH EXTRACTION  age 14's   Social History   Occupational History  . 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 Smoking status: Never Smoker  . Smokeless tobacco: Never Used  Vaping Use  . Vaping Use: Never used  Substance and Sexual Activity  . Alcohol use: Yes    Alcohol/week: 3.0 standard drinks    Types: 3 Standard drinks or equivalent per week    Comment: Beer/Wine  . Drug use: No  . Sexual activity: Yes    Partners: Male    Birth control/protection: Post-menopausal

## 2020-10-28 ENCOUNTER — Ambulatory Visit: Payer: Managed Care, Other (non HMO) | Admitting: Orthopaedic Surgery

## 2020-11-06 ENCOUNTER — Ambulatory Visit (INDEPENDENT_AMBULATORY_CARE_PROVIDER_SITE_OTHER): Payer: Managed Care, Other (non HMO) | Admitting: Orthopaedic Surgery

## 2020-11-06 ENCOUNTER — Encounter: Payer: Self-pay | Admitting: Orthopaedic Surgery

## 2020-11-06 ENCOUNTER — Other Ambulatory Visit: Payer: Self-pay

## 2020-11-06 DIAGNOSIS — M7062 Trochanteric bursitis, left hip: Secondary | ICD-10-CM

## 2020-11-06 HISTORY — DX: Trochanteric bursitis, left hip: M70.62

## 2020-11-06 NOTE — Progress Notes (Signed)
Office Visit Note   Patient: Nicole Blackburn           Date of Birth: [birth year]           MRN: [id number] Visit Date: 11/06/2020              Requested by: [address], [place], [place] [address] [place],  [place] [address] PCP: Zola Button, Grayling Congress, DO   Assessment & Plan: Visit Diagnoses: No diagnosis found.  Plan: Left trochanteric injection performed.  Follow-Up Instructions: No follow-ups on file.   Orders:  No orders of the defined types were placed in this encounter.  No orders of the defined types were placed in this encounter.     Procedures: Large Joint Inj: L greater trochanter on 11/06/2020 3:13 PM Details: lateral approach Medications: 0.5 mL lidocaine 1 %; 2 mL bupivacaine 0.25 %; 40 mg methylPREDNISolone acetate 40 MG/ML      Clinical Data: No additional findings.   Subjective: Chief Complaint  Patient presents with  . Left Hip - Fracture, Follow-up    Fall 08/05/2020    HPI 60 year old female had a fall back in November with nondisplaced femoral neck fracture on the right.  She was treated with crutches nonweightbearing.  She has significant scoliosis and then using her crutches with protected weightbearing for many weeks she has developed progressive lateral left hip pain.  She is now ambulatory with a left hip limp points directly to the greater trochanter area where she is having pain and discomfort.  No pain in the right hip.  Review of Systems Updated unchanged.  Objective: Vital Signs: BP 129/86   Pulse 72   Ht 4\' 11"  (1.499 m)   Wt 103 lb (46.7 kg)   LMP 02/24/2015   BMI 20.80 kg/m   Physical Exam Constitutional:      Appearance: She is well-developed.  HENT:     Head: Normocephalic.     Right Ear: External ear normal.     Left Ear: External ear normal.  Eyes:     Pupils: Pupils are equal, round, and reactive to light.  Neck:     Thyroid: No thyromegaly.     Trachea: No tracheal deviation.  Cardiovascular:      Rate and Rhythm: Normal rate.  Pulmonary:     Effort: Pulmonary effort is normal.  Abdominal:     Palpations: Abdomen is soft.  Skin:    General: Skin is warm and dry.  Neurological:     Mental Status: She is alert and oriented to person, place, and time.  Psychiatric:        Mood and Affect: Mood and affect normal.        Behavior: Behavior normal.     Ortho Exam patient has minimal sciatic notch tenderness negative logroll of the hips.  Exquisite tenderness over the left greater trochanter.  Specialty Comments:  No specialty comments available.  Imaging: No results found.   PMFS History: Patient Active Problem List   Diagnosis Date Noted  . Femoral neck fracture (HCC) 08/12/2020  . S/P cervical spinal fusion 12/11/2019  . Cervical spinal stenosis 12/04/2019  . Other spondylosis with radiculopathy, cervical region 03/20/2019  . Dizzy 09/30/2018  . History of scoliosis 09/30/2018  . Right hip pain 09/30/2018  . Allergies 09/30/2018  . Rash 03/25/2018  . Fever 03/25/2018  . Muscle spasm 03/25/2018  . HTN (hypertension) 07/20/2015  . Chronic cough 12/11/2012  . Idiopathic scoliosis 12/11/2012  .  Anemia    Past Medical History:  Diagnosis Date  . Abnormal pap 1997/1998   cryo  . Anemia   . HLD (hyperlipidemia)    diet controlled, no meds  . Hypertension    no meds currently  . Scoliosis     Family History  Problem Relation Age of Onset  . COPD Mother   . Osteoporosis Mother   . Pulmonary embolism Mother   . Diabetes Father   . Hypertension Father   . Colon polyps Father   . Hypertension Sister   . Hypertension Brother   . Stroke Maternal Grandmother 60  . Cancer Paternal Grandmother 105       multiple myloma  . Cancer Paternal Grandfather        ? kind cancer    Past Surgical History:  Procedure Laterality Date  . ANTERIOR CERVICAL DECOMP/DISCECTOMY FUSION N/A 12/04/2019   Procedure: CERVICAL FIVE-SIX, CERVICAL SIX-SEVEN ANTERIOR CERVICAL  DECOMPRESSION/DISCECTOMY FUSION ALLOGRAFT PLATE;  Surgeon: [name], MD;  Location: [facility] OR;  Service: Orthopedics;  Laterality: N/A;  . COLONOSCOPY    . REFRACTIVE SURGERY Bilateral 2012   Dr Nile Riggs  . UPPER GI ENDOSCOPY    . WISDOM TOOTH EXTRACTION  age 50's   Social History   Occupational History  . 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 Smoking status: Never Smoker  . Smokeless tobacco: Never Used  Vaping Use  . Vaping Use: Never used  Substance and Sexual Activity  . Alcohol use: Yes    Alcohol/week: 3.0 standard drinks    Types: 3 Standard drinks or equivalent per week    Comment: Beer/Wine  . Drug use: No  . Sexual activity: Yes    Partners: Male    Birth control/protection: Post-menopausal

## 2020-11-09 MED ORDER — BUPIVACAINE HCL 0.25 % IJ SOLN
2.0000 mL | INTRAMUSCULAR | Status: AC | PRN
  Administered 2020-11-06: 2 mL via INTRA_ARTICULAR

## 2020-11-09 MED ORDER — LIDOCAINE HCL 1 % IJ SOLN
0.5000 mL | INTRAMUSCULAR | Status: AC | PRN
  Administered 2020-11-06: .5 mL

## 2020-11-09 MED ORDER — METHYLPREDNISOLONE ACETATE 40 MG/ML IJ SUSP
40.0000 mg | INTRAMUSCULAR | Status: AC | PRN
  Administered 2020-11-06: 40 mg via INTRA_ARTICULAR

## 2021-02-19 ENCOUNTER — Encounter: Payer: Managed Care, Other (non HMO) | Admitting: Family Medicine

## 2021-03-01 ENCOUNTER — Ambulatory Visit: Payer: Managed Care, Other (non HMO) | Admitting: Orthopaedic Surgery

## 2021-03-01 ENCOUNTER — Encounter: Payer: Self-pay | Admitting: Orthopaedic Surgery

## 2021-03-01 ENCOUNTER — Other Ambulatory Visit: Payer: Self-pay

## 2021-03-01 DIAGNOSIS — M7541 Impingement syndrome of right shoulder: Secondary | ICD-10-CM | POA: Diagnosis not present

## 2021-03-01 HISTORY — DX: Impingement syndrome of right shoulder: M75.41

## 2021-03-01 MED ORDER — METHYLPREDNISOLONE ACETATE 40 MG/ML IJ SUSP
40.0000 mg | INTRAMUSCULAR | Status: AC | PRN
  Administered 2021-03-01: 10:00:00 40 mg via INTRA_ARTICULAR

## 2021-03-01 MED ORDER — BUPIVACAINE HCL 0.25 % IJ SOLN
4.0000 mL | INTRAMUSCULAR | Status: AC | PRN
  Administered 2021-03-01: 10:00:00 4 mL via INTRA_ARTICULAR

## 2021-03-01 MED ORDER — LIDOCAINE HCL 1 % IJ SOLN
0.5000 mL | INTRAMUSCULAR | Status: AC | PRN
  Administered 2021-03-01: 10:00:00 .5 mL

## 2021-03-01 NOTE — Progress Notes (Signed)
Office Visit Note   Patient: Nicole Blackburn           Date of Birth: 1961/09/02           MRN: [id number] Visit Date: 03/01/2021              Requested by: [address], [place], [place] [address] [place],  [place] [address] PCP: Zola Button, Grayling Congress, DO   Assessment & Plan: Visit Diagnoses:  1. Impingement syndrome of right shoulder     Plan: Right subacromial injection performed which she tolerated well.  Postinjection she noted some relief.  She will return if she has persistent problems.  Follow-Up Instructions: No follow-ups on file.   Orders:  Orders Placed This Encounter  Procedures   Large Joint Inj   No orders of the defined types were placed in this encounter.     Procedures: Large Joint Inj: R subacromial bursa on 03/01/2021 10:02 AM Indications: pain Details: 22 G 1.5 in needle, lateral approach  Arthrogram: No  Medications: 4 mL bupivacaine 0.25 %; 40 mg methylPREDNISolone acetate 40 MG/ML; 0.5 mL lidocaine 1 % Outcome: tolerated well, no immediate complications Procedure, treatment alternatives, risks and benefits explained, specific risks discussed. Consent was given by the patient. Immediately prior to procedure a time out was called to verify the correct patient, procedure, equipment, support staff and site/side marked as required. Patient was prepped and draped in the usual sterile fashion.      Clinical Data: No additional findings.   Subjective: Chief Complaint  Patient presents with   Right Shoulder - Pain   Neck - Pain    HPI 60 year old female seen with right shoulder and neck pain that began a couple weeks ago.  She does not recall anything specific that she did.  She did some yard work over the weekend does not think it really made it worse.  She has noticed limitation range of motion of her right shoulder not of her left.  Discomfort with outstretch reaching.  Pain radiates down to the elbow and stops.  No pain in to the  fingers.  Previous two-level cervical fusion 12/04/2019 at C5-6 and C6-7.  She states prior to 2 weeks ago she had been doing well and was not having any problems.  She has had hip fracture treated conservatively and also trochanteric bursitis of opposite left hip that resolved with truck injection.  Review of Systems positive for osteoporosis on Fosamax x1 year.  Cervical fusion C5-6 C6-7 12/04/2019.  No fever chills no gait disturbance.  All the systems noncontributory to HPI.   Objective: Vital Signs: LMP 02/24/2015   Physical Exam Constitutional:      Appearance: She is well-developed.  HENT:     Head: Normocephalic.     Right Ear: External ear normal.     Left Ear: External ear normal. There is no impacted cerumen.  Eyes:     Pupils: Pupils are equal, round, and reactive to light.  Neck:     Thyroid: No thyromegaly.     Trachea: No tracheal deviation.  Cardiovascular:     Rate and Rhythm: Normal rate.  Pulmonary:     Effort: Pulmonary effort is normal.  Abdominal:     Palpations: Abdomen is soft.  Musculoskeletal:     Cervical back: No rigidity.  Skin:    General: Skin is warm and dry.  Neurological:     Mental Status: She is alert and oriented to person,  place, and time.  Psychiatric:        Behavior: Behavior normal.    Ortho Exam well-healed anterior neck incision no brachial plexus tenderness negative Spurling.  She has 80% neck rotation with minimal discomfort.  Positive impingement right shoulder negative left shoulder.  Long head of the biceps minimally tender.  Negative Hawkins positive Neer.  Upper extremity reflexes are 2+ no lateral epicondylar tenderness full elbow range of motion.  No weakness with resisted upper extremity testing other than pain with supraspinatus resistive testing.  Specialty Comments:  No specialty comments available.  Imaging: No results found.   PMFS History: Patient Active Problem List   Diagnosis Date Noted   Impingement syndrome  of right shoulder 03/01/2021   Trochanteric bursitis, left hip 11/06/2020   Femoral neck fracture (HCC) 08/12/2020   S/P cervical spinal fusion 12/11/2019   Cervical spinal stenosis 12/04/2019   Other spondylosis with radiculopathy, cervical region 03/20/2019   Dizzy 09/30/2018   History of scoliosis 09/30/2018   Right hip pain 09/30/2018   Allergies 09/30/2018   Rash 03/25/2018   Fever 03/25/2018   Muscle spasm 03/25/2018   HTN (hypertension) 07/20/2015   Chronic cough 12/11/2012   Idiopathic scoliosis 12/11/2012   Anemia    Past Medical History:  Diagnosis Date   Abnormal pap 1997/1998   cryo   Anemia    HLD (hyperlipidemia)    diet controlled, no meds   Hypertension    no meds currently   Scoliosis     Family History  Problem Relation Age of Onset   COPD Mother    Osteoporosis Mother    Pulmonary embolism Mother    Diabetes Father    Hypertension Father    Colon polyps Father    Hypertension Sister    Hypertension Brother    Stroke Maternal Grandmother 75   Cancer Paternal Grandmother 51       multiple myloma   Cancer Paternal Grandfather        ? kind cancer    Past Surgical History:  Procedure Laterality Date   ANTERIOR CERVICAL DECOMP/DISCECTOMY FUSION N/A 12/04/2019   Procedure: CERVICAL FIVE-SIX, CERVICAL SIX-SEVEN ANTERIOR CERVICAL DECOMPRESSION/DISCECTOMY FUSION ALLOGRAFT PLATE;  Surgeon: [name], MD;  Location: [facility] OR;  Service: Orthopedics;  Laterality: N/A;   COLONOSCOPY     REFRACTIVE SURGERY Bilateral 2012   Dr Nile Riggs   UPPER GI ENDOSCOPY     WISDOM TOOTH EXTRACTION  age 75's   Social History   Occupational History   Occupation: [employer] trader  Tobacco Use   Smoking status: Never   Smokeless tobacco: Never  Vaping Use   Vaping Use: Never used  Substance and Sexual Activity   Alcohol use: Yes    Alcohol/week: 3.0 standard drinks    Types: 3 Standard drinks or equivalent per week    Comment: Beer/Wine   Drug use:  No   Sexual activity: Yes    Partners: Male    Birth control/protection: Post-menopausal

## 2021-03-01 NOTE — Addendum Note (Signed)
Addended by: [name] on: 03/01/2021 10:05 AM   Modules accepted: Orders

## 2021-03-12 ENCOUNTER — Encounter: Payer: Managed Care, Other (non HMO) | Admitting: Family Medicine

## 2021-03-30 IMAGING — DX DG HIP (WITH OR WITHOUT PELVIS) 2-3V*R*
3 series · 3 of 3 positions shown · non-contrast
Comparison: None.

CLINICAL DATA: Limited range of motion.  Fall.  Right hip pain.

EXAM:
DG HIP (WITH OR WITHOUT PELVIS) 2-3V RIGHT

[pelvis ap]
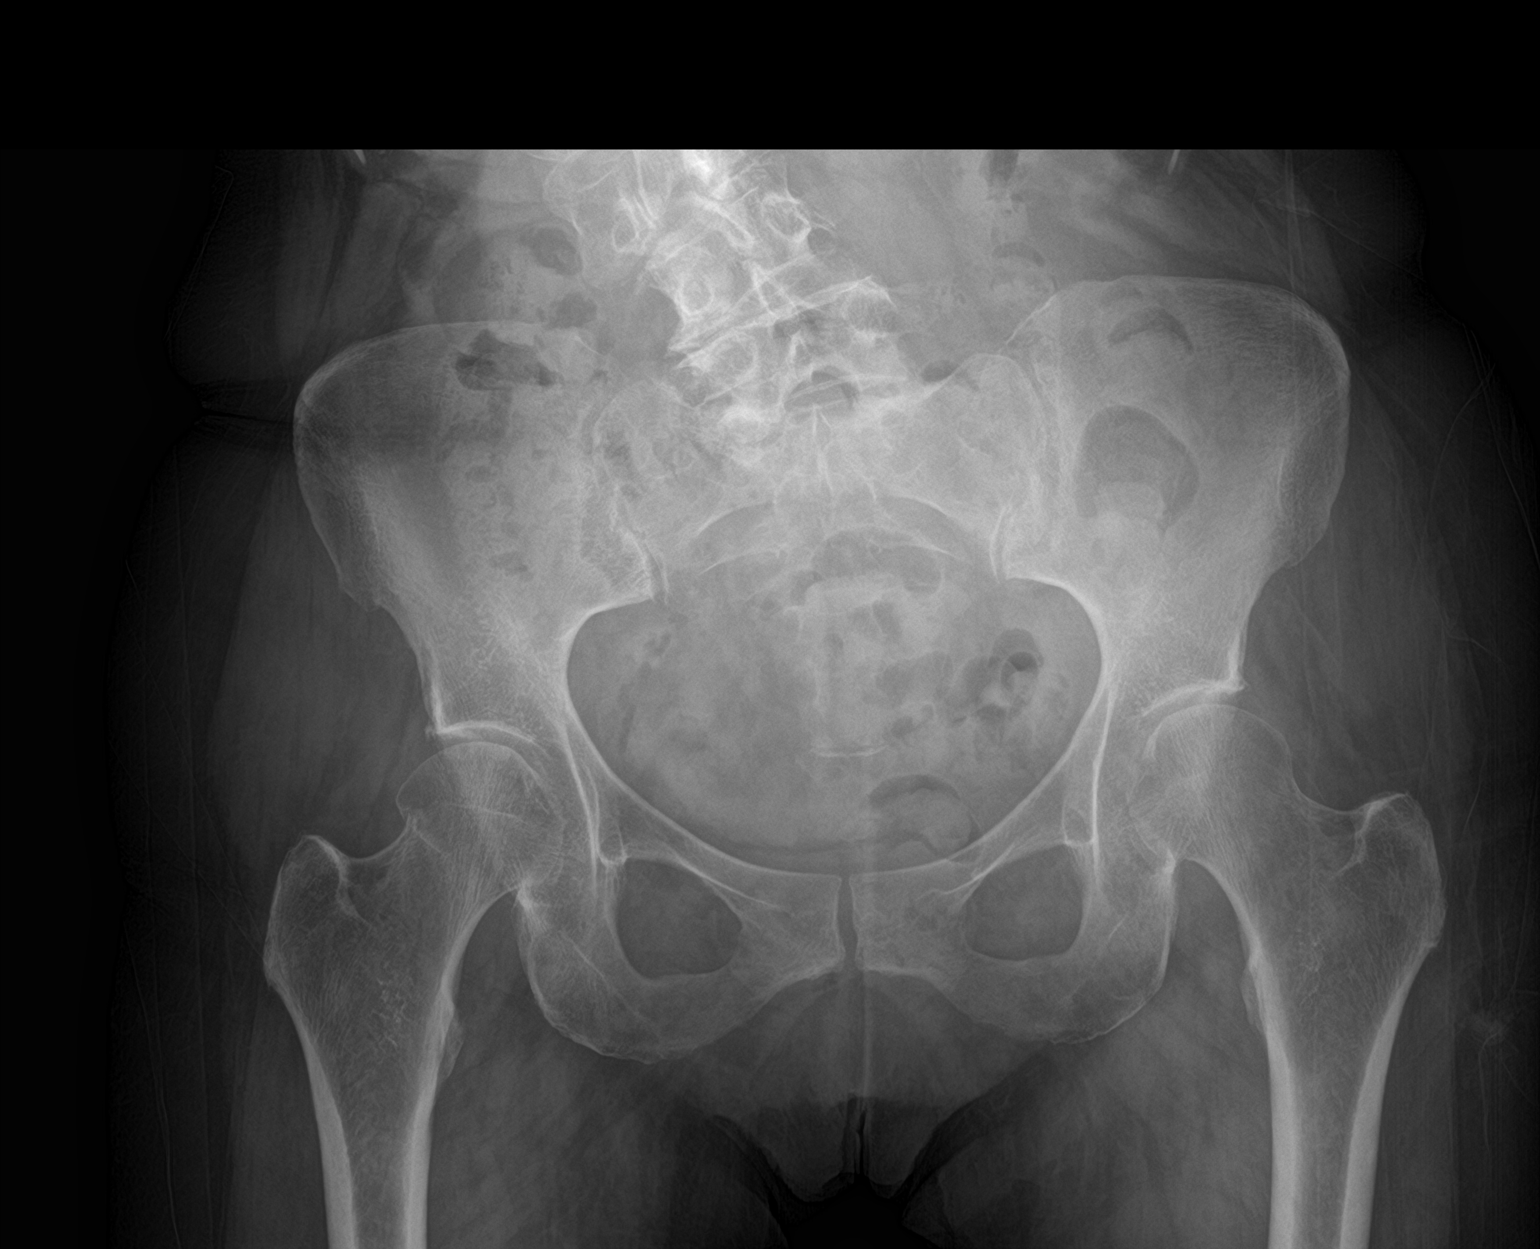

[hip ap]
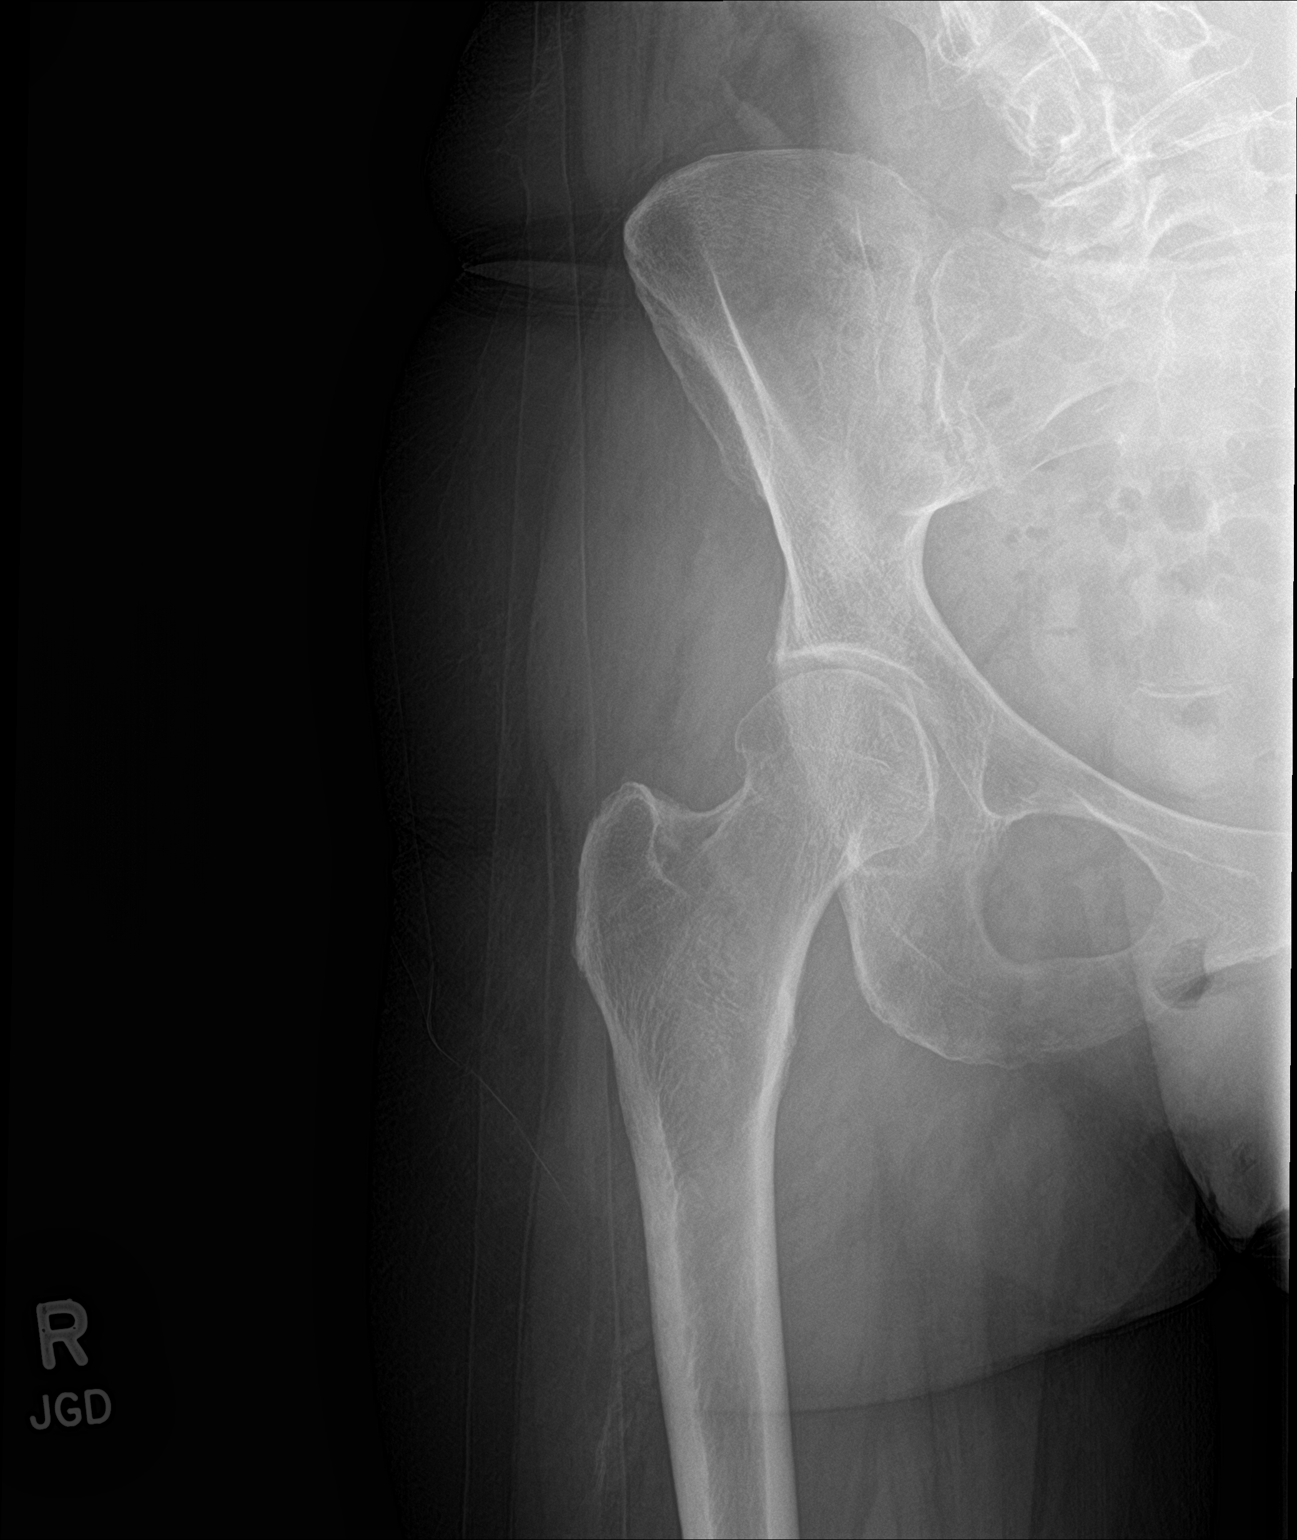

[hip lat]
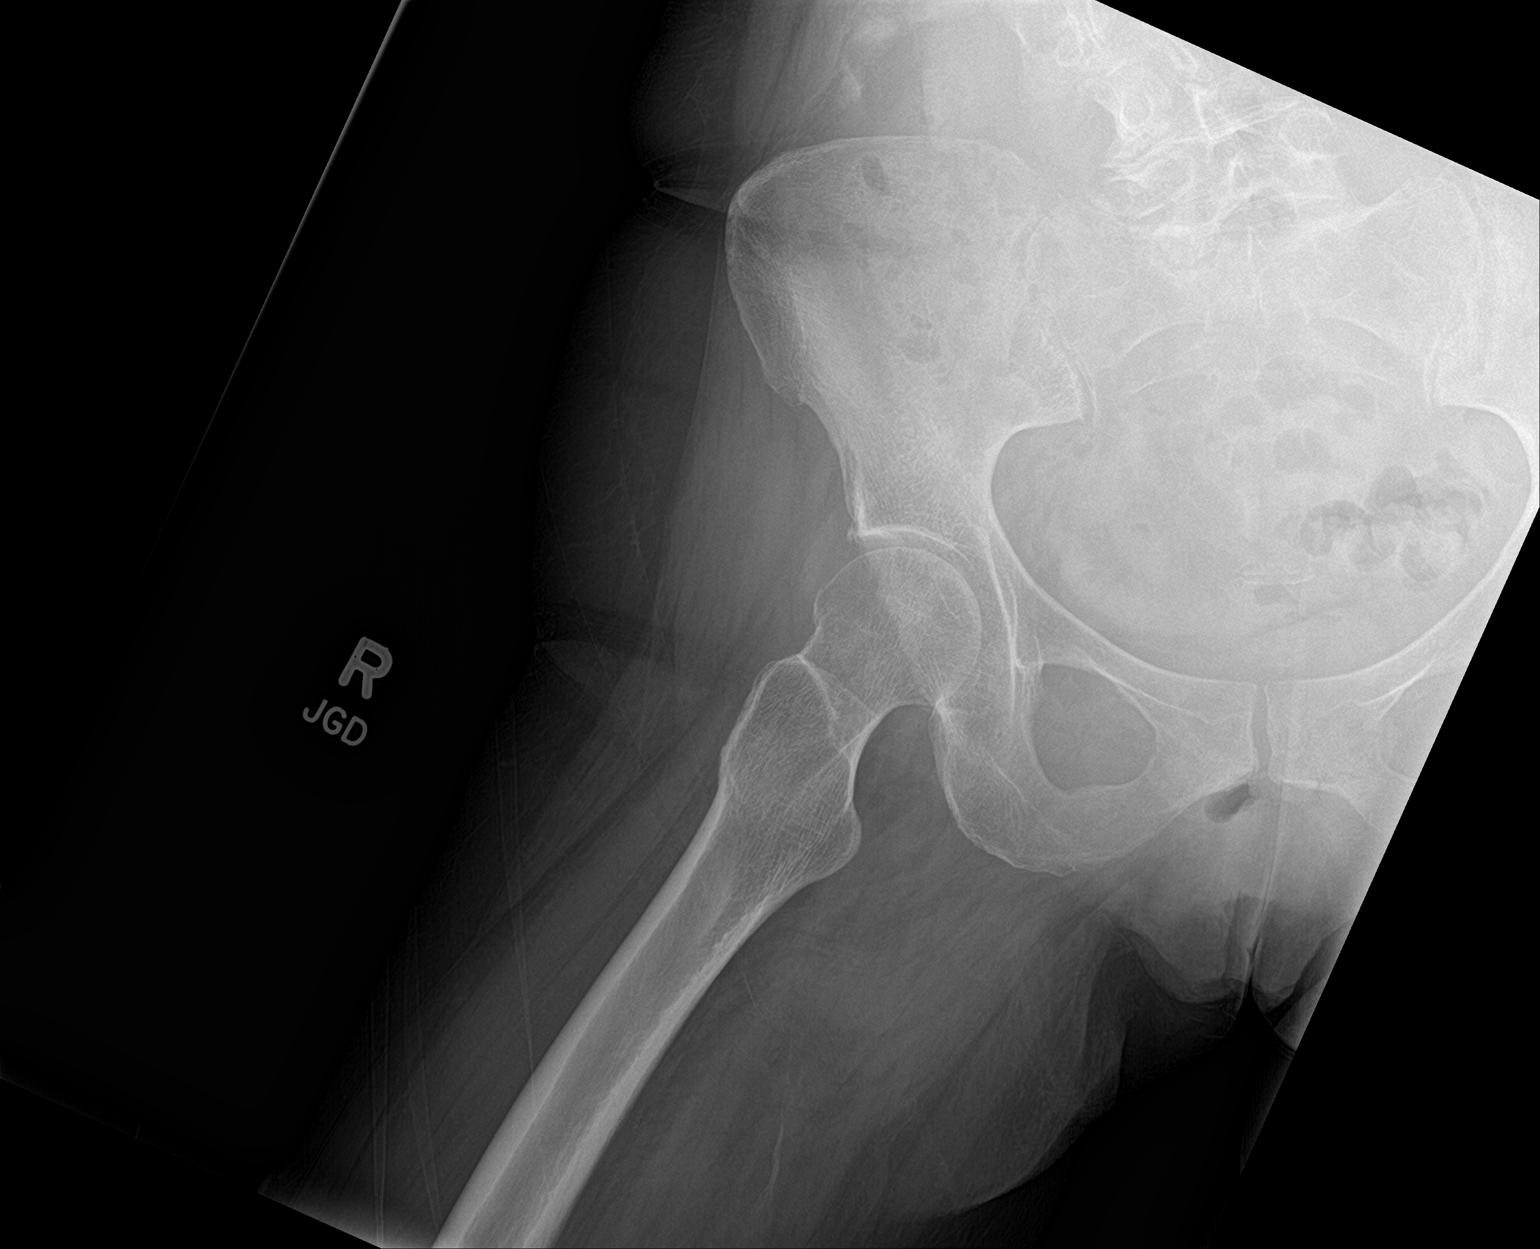

[3 of 3 positions shown; findings below may reference images not displayed]

FINDINGS: Subtle deformity of the right hip is identified with a focal area of
cortical disruption noted along the lateral aspect of the subcapital
femoral neck. No dislocation identified.
IMPRESSION: Findings suspicious for nondisplaced subcapital femoral neck
fracture. Recommend more definitive characterization with CT of the
right hip.

## 2021-04-01 ENCOUNTER — Ambulatory Visit (INDEPENDENT_AMBULATORY_CARE_PROVIDER_SITE_OTHER): Payer: Managed Care, Other (non HMO) | Admitting: Family Medicine

## 2021-04-01 ENCOUNTER — Other Ambulatory Visit: Payer: Self-pay

## 2021-04-01 ENCOUNTER — Encounter: Payer: Self-pay | Admitting: Family Medicine

## 2021-04-01 VITALS — BP 110/74 | HR 82 | Temp 98.1°F | Resp 18 | Ht 59.0 in

## 2021-04-01 DIAGNOSIS — Z1211 Encounter for screening for malignant neoplasm of colon: Secondary | ICD-10-CM | POA: Diagnosis not present

## 2021-04-01 DIAGNOSIS — Z0000 Encounter for general adult medical examination without abnormal findings: Secondary | ICD-10-CM

## 2021-04-01 HISTORY — DX: Encounter for general adult medical examination without abnormal findings: Z00.00

## 2021-04-01 NOTE — Assessment & Plan Note (Signed)
ghm utd Check labs  See avs  

## 2021-04-01 NOTE — Progress Notes (Addendum)
Subjective:   By signing my name below, I, Nicole Blackburn, attest that this documentation has been prepared under the direction and in the presence of Seabron Spates, DO 04/01/2021   Patient ID: Nicole Blackburn, female    DOB: [birth year], 60 y.o.   MRN: [id number]  Chief Complaint  Patient presents with   Annual Exam    Concerns/ questions: recurrent cough Colonoscopy: not in a while Does not see gyn    HPI Patient is in today for a physical exam  She was having shoulder pain but reports that it is feeling better after she got an injection. She is managing her osteoporosis with  70 mg Fosamax PO every 7 days and reports doing well on it. She denies fever, hearing loss, ear pain,congestion, sinus pain, sore throat, eye pain, chest pain, palpitations, cough, shortness of breath, wheezing, nausea. vomiting, diarrhea, constipation, blood in stool, dysuria,frequency, hematuria and headaches. She has 3 Covid-19 vaccines at this time. She is UTD on the shingles vaccine. She is UTD on vision care. She is UTD on dental care.   Past Medical History:  Diagnosis Date   Abnormal pap 1997/1998   cryo   Anemia    HLD (hyperlipidemia)    diet controlled, no meds   Hypertension    no meds currently   Scoliosis     Past Surgical History:  Procedure Laterality Date   ANTERIOR CERVICAL DECOMP/DISCECTOMY FUSION N/A 12/04/2019   Procedure: CERVICAL FIVE-SIX, CERVICAL SIX-SEVEN ANTERIOR CERVICAL DECOMPRESSION/DISCECTOMY FUSION ALLOGRAFT PLATE;  Surgeon: [name], MD;  Location: [facility] OR;  Service: Orthopedics;  Laterality: N/A;   COLONOSCOPY     REFRACTIVE SURGERY Bilateral 2012   Dr Nile Riggs   UPPER GI ENDOSCOPY     WISDOM TOOTH EXTRACTION  age 89's    Family History  Problem Relation Age of Onset   COPD Mother    Osteoporosis Mother    Pulmonary embolism Mother    Diabetes Father    Hypertension Father    Colon polyps Father    Hypertension Sister    Hypertension Brother    Stroke  Maternal Grandmother 22   Cancer Paternal Grandmother 61       multiple myloma   Cancer Paternal Grandfather        ? 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  Tobacco Use   Smoking status: Never   Smokeless tobacco: Never  Vaping Use   Vaping Use: Never used  Substance and Sexual Activity   Alcohol use: Yes    Alcohol/week: 3.0 standard drinks    Types: 3 Standard drinks or equivalent per week    Comment: Beer/Wine   Drug use: No   Sexual activity: Yes    Partners: Male    Birth control/protection: Post-menopausal  Other Topics Concern   Not on file  Social History Narrative   Exercise-- elliptical   Social Determinants of Health   Financial Resource Strain: Not on file  Food Insecurity: Not on file  Transportation Needs: Not on file  Physical Activity: Not on file  Stress: Not on file  Social Connections: Not on file  Intimate Partner Violence: Not on file    Outpatient Medications Prior  to Visit  Medication Sig Dispense Refill   alendronate (FOSAMAX) 70 MG tablet Take 1 tablet (70 mg total) by mouth every 7 (seven) days. Take with a full glass of water on an empty stomach. 12 tablet 3   Multiple Vitamins-Minerals (MULTIVITAMIN PO) Take 1 tablet by mouth daily.      psyllium (REGULOID) 0.52 g capsule Take 0.52 g by mouth daily.     naproxen sodium (ALEVE) 220 MG tablet Take 220 mg by mouth 2 (two) times daily as needed (pain).  (Patient not taking: Reported on 04/01/2021)     No facility-administered medications prior to visit.    No Known Allergies  Review of Systems  Constitutional:  Negative for fever.  HENT:  Negative for congestion, ear pain, hearing loss, sinus pain and sore throat.   Eyes:  Negative for pain.   Respiratory:  Negative for cough, shortness of breath and wheezing.   Cardiovascular:  Negative for chest pain and palpitations.  Gastrointestinal:  Negative for blood in stool, constipation, diarrhea, nausea and vomiting.  Genitourinary:  Negative for dysuria, frequency and hematuria.  Neurological:  Negative for headaches.      Objective:    Physical Exam Constitutional:      General: She is not in acute distress.    Appearance: Normal appearance. She is not ill-appearing.  HENT:     Right Ear: Tympanic membrane, ear canal and external ear normal.     Left Ear: Tympanic membrane, ear canal and external ear normal.  Eyes:     Extraocular Movements: Extraocular movements intact.     Pupils: Pupils are equal, round, and reactive to light.  Cardiovascular:     Rate and Rhythm: Normal rate and regular rhythm.     Pulses: Normal pulses.     Heart sounds: Normal heart sounds. No murmur heard. Pulmonary:     Effort: Pulmonary effort is normal. No respiratory distress.     Breath sounds: Normal breath sounds. No wheezing or rales.  Chest:  Breasts:    Right: Normal. No swelling, bleeding, inverted nipple, mass, nipple discharge, skin change, tenderness or axillary adenopathy.     Left: Normal. No swelling, bleeding, inverted nipple, mass, nipple discharge, skin change, tenderness or axillary adenopathy.  Abdominal:     General: Bowel sounds are normal. There is no distension.     Palpations: Abdomen is soft.     Tenderness: There is no abdominal tenderness. There is no guarding or rebound.  Lymphadenopathy:     Upper Body:     Right upper body: No axillary adenopathy.     Left upper body: No axillary adenopathy.  Skin:    General: Skin is warm and dry.  Neurological:     Mental Status: She is alert and oriented to person, place, and time.  Psychiatric:        Behavior: Behavior normal.        Judgment: Judgment normal.    BP 110/74 (BP Location: Left Arm, Patient Position:  Sitting, Cuff Size: Normal)   Pulse 82   Temp 98.1 F (36.7 C) (Oral)   Resp 18   Ht 4\' 11"  (1.499 m)   LMP 02/24/2015   SpO2 97%   BMI 20.80 kg/m  Wt Readings from Last 3 Encounters:  11/06/20 103 lb (46.7 kg)  09/16/20 104 lb (47.2 kg)  08/12/20 106 lb (48.1 kg)    Diabetic Foot Exam - Simple   No data filed    Lab Results  Component  Value Date   WBC 7.6 08/08/2020   HGB 11.4 (L) 08/08/2020   HCT 36.9 08/08/2020   PLT 300 08/08/2020   GLUCOSE 89 08/08/2020   CHOL 186 02/20/2020   TRIG 158.0 (H) 02/20/2020   HDL 72.40 02/20/2020   LDLDIRECT 123.0 03/22/2016   LDLCALC 82 02/20/2020   ALT 10 02/20/2020   AST 16 02/20/2020   NA 140 08/08/2020   K 4.2 08/08/2020   CL 105 08/08/2020   CREATININE 0.69 08/08/2020   BUN 19 08/08/2020   CO2 24 08/08/2020   TSH 2.58 02/20/2020   INR 1.1 08/08/2020    Lab Results  Component Value Date   TSH 2.58 02/20/2020   Lab Results  Component Value Date   WBC 7.6 08/08/2020   HGB 11.4 (L) 08/08/2020   HCT 36.9 08/08/2020   MCV 92.9 08/08/2020   PLT 300 08/08/2020   Lab Results  Component Value Date   NA 140 08/08/2020   K 4.2 08/08/2020   CO2 24 08/08/2020   GLUCOSE 89 08/08/2020   BUN 19 08/08/2020   CREATININE 0.69 08/08/2020   BILITOT 0.6 02/20/2020   ALKPHOS 110 02/20/2020   AST 16 02/20/2020   ALT 10 02/20/2020   PROT 7.3 02/20/2020   ALBUMIN 4.3 02/20/2020   CALCIUM 9.2 08/08/2020   ANIONGAP 11 08/08/2020   GFR 96.79 02/20/2020   Lab Results  Component Value Date   CHOL 186 02/20/2020   Lab Results  Component Value Date   HDL 72.40 02/20/2020   Lab Results  Component Value Date   LDLCALC 82 02/20/2020   Lab Results  Component Value Date   TRIG 158.0 (H) 02/20/2020   Lab Results  Component Value Date   CHOLHDL 3 02/20/2020   No results found for: HGBA1C  Colonoscopy: Last completed on 02/29/2012. Results showed prominent internal hemorrhoids nut otherwise normal. Repeat in 7 years.  Due. Mammogram: Last completed on 07/07/2020. Results were normal. Repeat in 1 year Pap Smear: Last completed on 02/20/2020. Results were normal. Repeat in 3 years. Dexa: Last completed on 05/01/2020. Results showed she is osteoporotic according to the [employer].    Assessment & Plan:   Problem List Items Addressed This Visit       Unprioritized   Preventative health care - Primary    ghm utd Check labs See avs        Relevant Orders   Lipid panel   CBC with Differential/Platelet   TSH   Comprehensive metabolic panel   Other Visit Diagnoses     Colon cancer screening       Relevant Orders   Ambulatory referral to Gastroenterology        No orders of the defined types were placed in this encounter.   I, Seabron Spates DO. , personally preformed the services described in this documentation.  All medical record entries made by the scribe were at my direction and in my presence.  I have reviewed the chart and discharge instructions (if applicable) and agree that the record reflects my personal performance and is accurate and complete. 04/01/2021   I,Nicole Blackburn,acting as a scribe for [employer], DO.,have documented all relevant documentation on the behalf of Nicole Schultz, DO,as directed by  Nicole Schultz, DO while in the presence of Nicole Schultz, DO.      Nicole Schultz, DO

## 2021-04-01 NOTE — Patient Instructions (Signed)
Preventive Care 60-60 Years Old, Female Preventive care refers to lifestyle choices and visits with your health care provider that can promote health and wellness. This includes: A yearly physical exam. This is also called an annual wellness visit. Regular dental and eye exams. Immunizations. Screening for certain conditions. Healthy lifestyle choices, such as: Eating a healthy diet. Getting regular exercise. Not using drugs or products that contain nicotine and tobacco. Limiting alcohol use. What can I expect for my preventive care visit? Physical exam Your health care provider will check your: Height and weight. These may be used to calculate your BMI (body mass index). BMI is a measurement that tells if you are at a healthy weight. Heart rate and blood pressure. Body temperature. Skin for abnormal spots. Counseling Your health care provider may ask you questions about your: Past medical problems. Family's medical history. Alcohol, tobacco, and drug use. Emotional well-being. Home life and relationship well-being. Sexual activity. Diet, exercise, and sleep habits. Work and work Statistician. Access to firearms. Method of birth control. Menstrual cycle. Pregnancy history. What immunizations do I need?  Vaccines are usually given at various ages, according to a schedule. Your health care provider will recommend vaccines for you based on your age, medicalhistory, and lifestyle or other factors, such as travel or where you work. What tests do I need? Blood tests Lipid and cholesterol levels. These may be checked every 5 years, or more often if you are over 60 years old. Hepatitis C test. Hepatitis B test. Screening Lung cancer screening. You may have this screening every year starting at age 60 if you have a 30-pack-year history of smoking and currently smoke or have quit within the past 15 years. Colorectal cancer screening. All adults should have this screening starting at  age 23 and continuing until age 60. Your health care provider may recommend screening at age 60 if you are at increased risk. You will have tests every 1-10 years, depending on your results and the type of screening test. Diabetes screening. This is done by checking your blood sugar (glucose) after you have not eaten for a while (fasting). You may have this done every 1-3 years. Mammogram. This may be done every 1-2 years. Talk with your health care provider about when you should start having regular mammograms. This may depend on whether you have a family history of breast cancer. BRCA-related cancer screening. This may be done if you have a family history of breast, ovarian, tubal, or peritoneal cancers. Pelvic exam and Pap test. This may be done every 3 years starting at age 60. Starting at age 54, this may be done every 5 years if you have a Pap test in combination with an HPV test. Other tests STD (sexually transmitted disease) testing, if you are at risk. Bone density scan. This is done to screen for osteoporosis. You may have this scan if you are at high risk for osteoporosis. Talk with your health care provider about your test results, treatment options,and if necessary, the need for more tests. Follow these instructions at home: Eating and drinking  Eat a diet that includes fresh fruits and vegetables, whole grains, lean protein, and low-fat dairy products. Take vitamin and mineral supplements as recommended by your health care provider. Do not drink alcohol if: Your health care provider tells you not to drink. You are pregnant, may be pregnant, or are planning to become pregnant. If you drink alcohol: Limit how much you have to 0-1 drink a day. Be aware  of how much alcohol is in your drink. In the [place] one drink equals one 12 oz bottle of beer (355 mL), one 5 oz glass of wine (148 mL), or one 1 oz glass of hard liquor (44 mL).  Lifestyle Take daily care of your teeth and  gums. Brush your teeth every morning and night with fluoride toothpaste. Floss one time each day. Stay active. Exercise for at least 30 minutes 5 or more days each week. Do not use any products that contain nicotine or tobacco, such as cigarettes, e-cigarettes, and chewing tobacco. If you need help quitting, ask your health care provider. Do not use drugs. If you are sexually active, practice safe sex. Use a condom or other form of protection to prevent STIs (sexually transmitted infections). If you do not wish to become pregnant, use a form of birth control. If you plan to become pregnant, see your health care provider for a prepregnancy visit. If told by your health care provider, take low-dose aspirin daily starting at age 60. Find healthy ways to cope with stress, such as: Meditation, yoga, or listening to music. Journaling. Talking to a trusted person. Spending time with friends and family. Safety Always wear your seat belt while driving or riding in a vehicle. Do not drive: If you have been drinking alcohol. Do not ride with someone who has been drinking. When you are tired or distracted. While texting. Wear a helmet and other protective equipment during sports activities. If you have firearms in your house, make sure you follow all gun safety procedures. What's next? Visit your health care provider once a year for an annual wellness visit. Ask your health care provider how often you should have your eyes and teeth checked. Stay up to date on all vaccines. This information is not intended to replace advice given to you by your health care provider. Make sure you discuss any questions you have with your healthcare provider. Document Revised: 06/02/2020 Document Reviewed: 05/10/2018 Elsevier Patient Education  2022 [employer].

## 2021-04-01 NOTE — Progress Notes (Signed)
Subjective:   By signing my name below, I, Shehryar Baig, attest that this documentation has been prepared under the direction and in the presence of Dr. Seabron Spates, DO. 04/01/2021   Patient ID: Nicole Blackburn, female    DOB: [birth year], 60 y.o.   MRN: [id number]  No chief complaint on file.   HPI Patient is in today for a physical exam.  She denies having any fever, ear pain, congestion, sinus pain, sore throat, eye pain, chest pain, palpations, cough, SOB, wheezing, n/v/d, constipation, blood in stool, dysuria, frequency, hematuria, or headaches at this time.   Past Medical History:  Diagnosis Date   Abnormal pap 1997/1998   cryo   Anemia    HLD (hyperlipidemia)    diet controlled, no meds   Hypertension    no meds currently   Scoliosis     Past Surgical History:  Procedure Laterality Date   ANTERIOR CERVICAL DECOMP/DISCECTOMY FUSION N/A 12/04/2019   Procedure: CERVICAL FIVE-SIX, CERVICAL SIX-SEVEN ANTERIOR CERVICAL DECOMPRESSION/DISCECTOMY FUSION ALLOGRAFT PLATE;  Surgeon: [name], MD;  Location: [facility] OR;  Service: Orthopedics;  Laterality: N/A;   COLONOSCOPY     REFRACTIVE SURGERY Bilateral 2012   Dr Nile Riggs   UPPER GI ENDOSCOPY     WISDOM TOOTH EXTRACTION  age 36's    Family History  Problem Relation Age of Onset   COPD Mother    Osteoporosis Mother    Pulmonary embolism Mother    Diabetes Father    Hypertension Father    Colon polyps Father    Hypertension Sister    Hypertension Brother    Stroke Maternal Grandmother 1   Cancer Paternal Grandmother 34       multiple myloma   Cancer Paternal Grandfather        ? 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  Tobacco Use   Smoking status: Never    Smokeless tobacco: Never  Vaping Use   Vaping Use: Never used  Substance and Sexual Activity   Alcohol use: Yes    Alcohol/week: 3.0 standard drinks    Types: 3 Standard drinks or equivalent per week    Comment: Beer/Wine   Drug use: No   Sexual activity: Yes    Partners: Male    Birth control/protection: Post-menopausal  Other Topics Concern   Not on file  Social History Narrative   Exercise-- elliptical   Social Determinants of Health   Financial Resource Strain: Not on file  Food Insecurity: Not on file  Transportation Needs: Not on file  Physical Activity: Not on file  Stress: Not on file  Social Connections: Not on file  Intimate Partner Violence: Not on file    Outpatient Medications Prior to Visit  Medication Sig Dispense Refill   alendronate (FOSAMAX) 70 MG tablet Take 1 tablet (70 mg total) by mouth every 7 (seven) days. Take with a full glass of water on an empty stomach. 12 tablet 3   Multiple Vitamins-Minerals (MULTIVITAMIN PO) Take 1 tablet by mouth daily.      naproxen sodium (ALEVE) 220 MG tablet Take 220 mg by mouth 2 (two) times daily as needed (pain).      psyllium (REGULOID) 0.52 g capsule  Take 0.52 g by mouth daily.     No facility-administered medications prior to visit.    No Known Allergies  Review of Systems  Constitutional:  Negative for fever.  HENT:  Negative for congestion, ear pain, hearing loss, sinus pain and sore throat.   Eyes:  Negative for pain.  Respiratory:  Negative for cough, shortness of breath and wheezing.   Cardiovascular:  Negative for chest pain and palpitations.  Gastrointestinal:  Negative for blood in stool, constipation, diarrhea, nausea and vomiting.  Genitourinary:  Negative for dysuria, frequency and hematuria.  Neurological:  Negative for headaches.      Objective:    Physical Exam Constitutional:      General: She is not in acute distress.    Appearance: Normal appearance. She is not ill-appearing.  HENT:      Head: Normocephalic and atraumatic.     Right Ear: Tympanic membrane, ear canal and external ear normal.     Left Ear: Tympanic membrane, ear canal and external ear normal.  Eyes:     Extraocular Movements: Extraocular movements intact.     Pupils: Pupils are equal, round, and reactive to light.  Cardiovascular:     Rate and Rhythm: Normal rate and regular rhythm.     Pulses: Normal pulses.     Heart sounds: Normal heart sounds. No murmur heard. Pulmonary:     Effort: Pulmonary effort is normal. No respiratory distress.     Breath sounds: Normal breath sounds. No wheezing or rales.  Abdominal:     General: Bowel sounds are normal. There is no distension.     Palpations: Abdomen is soft.     Tenderness: There is no abdominal tenderness. There is no guarding or rebound.  Skin:    General: Skin is warm and dry.  Neurological:     Mental Status: She is alert and oriented to person, place, and time.  Psychiatric:        Behavior: Behavior normal.        Judgment: Judgment normal.    LMP 02/24/2015  Wt Readings from Last 3 Encounters:  11/06/20 103 lb (46.7 kg)  09/16/20 104 lb (47.2 kg)  08/12/20 106 lb (48.1 kg)    Diabetic Foot Exam - Simple   No data filed    Lab Results  Component Value Date   WBC 7.6 08/08/2020   HGB 11.4 (L) 08/08/2020   HCT 36.9 08/08/2020   PLT 300 08/08/2020   GLUCOSE 89 08/08/2020   CHOL 186 02/20/2020   TRIG 158.0 (H) 02/20/2020   HDL 72.40 02/20/2020   LDLDIRECT 123.0 03/22/2016   LDLCALC 82 02/20/2020   ALT 10 02/20/2020   AST 16 02/20/2020   NA 140 08/08/2020   K 4.2 08/08/2020   CL 105 08/08/2020   CREATININE 0.69 08/08/2020   BUN 19 08/08/2020   CO2 24 08/08/2020   TSH 2.58 02/20/2020   INR 1.1 08/08/2020    Lab Results  Component Value Date   TSH 2.58 02/20/2020   Lab Results  Component Value Date   WBC 7.6 08/08/2020   HGB 11.4 (L) 08/08/2020   HCT 36.9 08/08/2020   MCV 92.9 08/08/2020   PLT 300 08/08/2020    Lab Results  Component Value Date   NA 140 08/08/2020   K 4.2 08/08/2020   CO2 24 08/08/2020   GLUCOSE 89 08/08/2020   BUN 19 08/08/2020   CREATININE 0.69 08/08/2020   BILITOT 0.6 02/20/2020   ALKPHOS 110 02/20/2020  AST 16 02/20/2020   ALT 10 02/20/2020   PROT 7.3 02/20/2020   ALBUMIN 4.3 02/20/2020   CALCIUM 9.2 08/08/2020   ANIONGAP 11 08/08/2020   GFR 96.79 02/20/2020   Lab Results  Component Value Date   CHOL 186 02/20/2020   Lab Results  Component Value Date   HDL 72.40 02/20/2020   Lab Results  Component Value Date   LDLCALC 82 02/20/2020   Lab Results  Component Value Date   TRIG 158.0 (H) 02/20/2020   Lab Results  Component Value Date   CHOLHDL 3 02/20/2020   No results found for: HGBA1C    Mammogram: Pap smear: Dexa: Colonoscopy:   Assessment & Plan:   Problem List Items Addressed This Visit   None     No orders of the defined types were placed in this encounter.   I, Dr. Seabron Spates, DO. , personally preformed the services described in this documentation.  All medical record entries made by the scribe were at my direction and in my presence.  I have reviewed the chart and discharge instructions (if applicable) and agree that the record reflects my personal performance and is accurate and complete. 04/01/2021   I,Shehryar Baig,acting as a Neurosurgeon for [employer], DO.,have documented all relevant documentation on the behalf of [name], DO,as directed by  [name], DO while in the presence of [name], DO.    Shehryar [employer]

## 2021-04-02 LAB — COMPREHENSIVE METABOLIC PANEL
ALT: 12 U/L (ref 0–35)
AST: 20 U/L (ref 0–37)
Albumin: 4.7 g/dL (ref 3.5–5.2)
Alkaline Phosphatase: 74 U/L (ref 39–117)
BUN: 14 mg/dL (ref 6–23)
CO2: 29 mEq/L (ref 19–32)
Calcium: 9.3 mg/dL (ref 8.4–10.5)
Chloride: 102 mEq/L (ref 96–112)
Creatinine, Ser: 0.62 mg/dL (ref 0.40–1.20)
GFR: 97.15 mL/min (ref >60.00)
Glucose, Bld: 86 mg/dL (ref 70–99)
Potassium: 4.6 mEq/L (ref 3.5–5.1)
Sodium: 142 mEq/L (ref 135–145)
Total Bilirubin: 0.4 mg/dL (ref 0.2–1.2)
Total Protein: 7.5 g/dL (ref 6.0–8.3)

## 2021-04-02 LAB — CBC WITH DIFFERENTIAL/PLATELET
Basophils Absolute: 0.1 10*3/uL (ref 0.0–0.1)
Basophils Relative: 1 % (ref 0.0–3.0)
Eosinophils Absolute: 0 10*3/uL (ref 0.0–0.7)
Eosinophils Relative: 0.5 % (ref 0.0–5.0)
HCT: 40 % (ref 36.0–46.0)
Hemoglobin: 13.3 g/dL (ref 12.0–15.0)
Lymphocytes Relative: 29.1 % (ref 12.0–46.0)
Lymphs Abs: 1.8 10*3/uL (ref 0.7–4.0)
MCHC: 33.2 g/dL (ref 30.0–36.0)
MCV: 93.5 fl (ref 78.0–100.0)
Monocytes Absolute: 0.4 10*3/uL (ref 0.1–1.0)
Monocytes Relative: 6.2 % (ref 3.0–12.0)
Neutro Abs: 3.9 10*3/uL (ref 1.4–7.7)
Neutrophils Relative %: 63.2 % (ref 43.0–77.0)
Platelets: 279 10*3/uL (ref 150.0–400.0)
RBC: 4.28 Mil/uL (ref 3.87–5.11)
RDW: 14 % (ref 11.5–15.5)
WBC: 6.1 10*3/uL (ref 4.0–10.5)

## 2021-04-02 LAB — LIPID PANEL
Cholesterol: 209 mg/dL — ABNORMAL HIGH (ref 0–200)
HDL: 90.6 mg/dL (ref >39.00)
LDL Cholesterol: 89 mg/dL (ref 0–99)
NonHDL: 118.11
Total CHOL/HDL Ratio: 2
Triglycerides: 146 mg/dL (ref 0.0–149.0)
VLDL: 29.2 mg/dL (ref 0.0–40.0)

## 2021-04-02 LAB — TSH: TSH: 2.89 u[IU]/mL (ref 0.35–5.50)

## 2021-04-28 ENCOUNTER — Encounter: Payer: Self-pay | Admitting: Family Medicine

## 2021-06-21 ENCOUNTER — Other Ambulatory Visit: Payer: Self-pay | Admitting: Family Medicine

## 2021-09-02 ENCOUNTER — Encounter: Payer: Self-pay | Admitting: Family Medicine

## 2021-09-02 ENCOUNTER — Other Ambulatory Visit: Payer: Self-pay | Admitting: Family Medicine

## 2021-09-02 DIAGNOSIS — Z634 Disappearance and death of family member: Secondary | ICD-10-CM

## 2021-09-02 DIAGNOSIS — F4321 Adjustment disorder with depressed mood: Secondary | ICD-10-CM

## 2021-09-02 MED ORDER — ALPRAZOLAM 0.25 MG PO TABS: 0.2500 mg | ORAL_TABLET | Freq: Two times a day (BID) | ORAL | 0 refills | Status: DC | PRN

## 2021-09-17 MED ORDER — ALPRAZOLAM 0.25 MG PO TABS: 0.2500 mg | ORAL_TABLET | Freq: Two times a day (BID) | ORAL | 0 refills | Status: DC | PRN

## 2021-09-17 NOTE — Telephone Encounter (Signed)
Requesting: Xanax Contract: N/A UDS: N/A Last OV: 04/01/2021 Next OV: N/A Last Refill: 09/02/2021, #20---0 RF Database:   Please advise

## 2021-09-30 ENCOUNTER — Telehealth: Payer: Self-pay

## 2021-09-30 NOTE — Telephone Encounter (Signed)
Nurse Assessment Nurse: Ottis Stain, RN, Sherrie Date/Time Eilene Ghazi Time): 09/30/2021 12:46:58 PM Confirm and document reason for call. If symptomatic, describe symptoms. ---Caller states having right side to middle chest pain, a squeezing type pain. Pain is sharp and then stops, only lasts for seconds. Movement is not associated with the pain, moving now and no pain. +Cough (dry). States has had cough for years but the CP only the last couple of days. Hx of CP in the past from panic attacks, feels kind of like that, just lost dtr in Dec and struggling with that. +fluids, +UOP in last 8 hours Does the patient have any new or worsening symptoms? ---Yes Will a triage be completed? ---Yes Related visit to physician within the last 2 weeks? ---No Does the PT have any chronic conditions? (i.e. diabetes, asthma, this includes High risk factors for pregnancy, etc.) ---Yes List chronic conditions. ---Seeing psychiatrist for grief Is this a behavioral health or substance abuse call? ---No Guidelines Guideline Title Affirmed Question Affirmed Notes Nurse Date/Time Eilene Ghazi Time) Chest Pain Taking a deep breath makes pain worse Ottis Stain, RN, [place] 09/30/2021 12:53:03 PM PLEASE NOTE: All timestamps contained within this report are represented as [place] Standard Time. CONFIDENTIALTY NOTICE: This fax transmission is intended only for the addressee. It contains information that is legally privileged, confidential or otherwise protected from use or disclosure. If you are not the intended recipient, you are strictly prohibited from reviewing, disclosing, copying using or disseminating any of this information or taking any action in reliance on or regarding this information. If you have received this fax in error, please notify us immediately by telephone so that we can arrange for its return to [place]. Phone: [phone], Toll-Free: [phone], Fax: [phone] Page: 2 of 2 Call Id: [id number] [place]. Time  Eilene Ghazi Time) Disposition Final User 09/30/2021 12:46:11 PM Send to Urgent Queue Milton Ferguson 09/30/2021 12:58:03 PM Go to ED Now (or PCP triage) Yes Ottis Stain, RN, Sherrie Caller Disagree/Comply Comply Caller Understands Yes PreDisposition InappropriateToAsk Care Advice Given Per Guideline GO TO ED NOW (OR PCP TRIAGE): * IF PCP SECOND-LEVEL TRIAGE REQUIRED: You may need to be seen. Your doctor (or NP/PA) will want to talk with you to decide what's best. I'll page the provider on-call now. If you haven't heard from the provider (or me) within 30 minutes, go directly to the [facility] at _____________ [place]: Evlyn Clines, RN Date/Time (Eastern Time): 09/30/2021 12:55:04 PM States chest pain was sharp 7/10 but only lasted about a min. Pain was non radiating. Referrals GO TO FACILITY OTHER - SPECIFY

## 2021-10-01 NOTE — Telephone Encounter (Signed)
Pt called. LVM to see if patient went anywhere to be evaluated and to check on her sxs. Pt advised to call back to discuss sxs.

## 2021-11-05 LAB — HM MAMMOGRAPHY

## 2021-11-09 ENCOUNTER — Encounter: Payer: Self-pay | Admitting: *Deleted

## 2021-11-18 ENCOUNTER — Ambulatory Visit: Payer: 59 | Admitting: Psychology

## 2022-04-07 ENCOUNTER — Ambulatory Visit (INDEPENDENT_AMBULATORY_CARE_PROVIDER_SITE_OTHER): Payer: Managed Care, Other (non HMO) | Admitting: Family Medicine

## 2022-04-07 ENCOUNTER — Encounter: Payer: Self-pay | Admitting: Family Medicine

## 2022-04-07 VITALS — BP 124/82 | HR 83 | Temp 98.7°F | Resp 16 | Ht 59.0 in | Wt 91.4 lb

## 2022-04-07 DIAGNOSIS — Z0000 Encounter for general adult medical examination without abnormal findings: Secondary | ICD-10-CM

## 2022-04-07 DIAGNOSIS — R059 Cough, unspecified: Secondary | ICD-10-CM

## 2022-04-07 DIAGNOSIS — M810 Age-related osteoporosis without current pathological fracture: Secondary | ICD-10-CM

## 2022-04-07 MED ORDER — ALENDRONATE SODIUM 70 MG PO TABS: ORAL_TABLET | ORAL | 3 refills | Status: DC

## 2022-04-07 MED ORDER — AMITRIPTYLINE HCL 10 MG PO TABS: 10.0000 mg | ORAL_TABLET | Freq: Every day | ORAL | 1 refills | Status: DC

## 2022-04-07 NOTE — Patient Instructions (Signed)
Preventive Care 40-61 Years Old, Female Preventive care refers to lifestyle choices and visits with your health care provider that can promote health and wellness. Preventive care visits are also called wellness exams. What can I expect for my preventive care visit? Counseling Your health care provider may ask you questions about your: Medical history, including: Past medical problems. Family medical history. Pregnancy history. Current health, including: Menstrual cycle. Method of birth control. Emotional well-being. Home life and relationship well-being. Sexual activity and sexual health. Lifestyle, including: Alcohol, nicotine or tobacco, and drug use. Access to firearms. Diet, exercise, and sleep habits. Work and work environment. Sunscreen use. Safety issues such as seatbelt and bike helmet use. Physical exam Your health care provider will check your: Height and weight. These may be used to calculate your BMI (body mass index). BMI is a measurement that tells if you are at a healthy weight. Waist circumference. This measures the distance around your waistline. This measurement also tells if you are at a healthy weight and may help predict your risk of certain diseases, such as type 2 diabetes and high blood pressure. Heart rate and blood pressure. Body temperature. Skin for abnormal spots. What immunizations do I need?  Vaccines are usually given at various ages, according to a schedule. Your health care provider will recommend vaccines for you based on your age, medical history, and lifestyle or other factors, such as travel or where you work. What tests do I need? Screening Your health care provider may recommend screening tests for certain conditions. This may include: Lipid and cholesterol levels. Diabetes screening. This is done by checking your blood sugar (glucose) after you have not eaten for a while (fasting). Pelvic exam and Pap test. Hepatitis B test. Hepatitis C  test. HIV (human immunodeficiency virus) test. STI (sexually transmitted infection) testing, if you are at risk. Lung cancer screening. Colorectal cancer screening. Mammogram. Talk with your health care provider about when you should start having regular mammograms. This may depend on whether you have a family history of breast cancer. BRCA-related cancer screening. This may be done if you have a family history of breast, ovarian, tubal, or peritoneal cancers. Bone density scan. This is done to screen for osteoporosis. Talk with your health care provider about your test results, treatment options, and if necessary, the need for more tests. Follow these instructions at home: Eating and drinking  Eat a diet that includes fresh fruits and vegetables, whole grains, lean protein, and low-fat dairy products. Take vitamin and mineral supplements as recommended by your health care provider. Do not drink alcohol if: Your health care provider tells you not to drink. You are pregnant, may be pregnant, or are planning to become pregnant. If you drink alcohol: Limit how much you have to 0-1 drink a day. Know how much alcohol is in your drink. In the [place] one drink equals one 12 oz bottle of beer (355 mL), one 5 oz glass of wine (148 mL), or one 1 oz glass of hard liquor (44 mL). Lifestyle Brush your teeth every morning and night with fluoride toothpaste. Floss one time each day. Exercise for at least 30 minutes 5 or more days each week. Do not use any products that contain nicotine or tobacco. These products include cigarettes, chewing tobacco, and vaping devices, such as e-cigarettes. If you need help quitting, ask your health care provider. Do not use drugs. If you are sexually active, practice safe sex. Use a condom or other form of protection to   prevent STIs. If you do not wish to become pregnant, use a form of birth control. If you plan to become pregnant, see your health care provider for a  prepregnancy visit. Take aspirin only as told by your health care provider. Make sure that you understand how much to take and what form to take. Work with your health care provider to find out whether it is safe and beneficial for you to take aspirin daily. Find healthy ways to manage stress, such as: Meditation, yoga, or listening to music. Journaling. Talking to a trusted person. Spending time with friends and family. Minimize exposure to UV radiation to reduce your risk of skin cancer. Safety Always wear your seat belt while driving or riding in a vehicle. Do not drive: If you have been drinking alcohol. Do not ride with someone who has been drinking. When you are tired or distracted. While texting. If you have been using any mind-altering substances or drugs. Wear a helmet and other protective equipment during sports activities. If you have firearms in your house, make sure you follow all gun safety procedures. Seek help if you have been physically or sexually abused. What's next? Visit your health care provider once a year for an annual wellness visit. Ask your health care provider how often you should have your eyes and teeth checked. Stay up to date on all vaccines. This information is not intended to replace advice given to you by your health care provider. Make sure you discuss any questions you have with your health care provider. Document Revised: 02/24/2021 Document Reviewed: 02/24/2021 Elsevier Patient Education  [place].

## 2022-04-07 NOTE — Progress Notes (Deleted)
Subjective:   By signing my name below, I, [name], attest that this documentation has been prepared under the direction and in the presence of Nicole Schultz, DO 04/07/2022    Patient ID: Nicole Blackburn, female    DOB: [birth year], 61 y.o.   MRN: [id number]  Chief Complaint  Patient presents with   Annual Exam    Pt states fasting     HPI Patient is in today for a comprehensive physical exam.    She denies having any fever, new moles, congestion, sinus pain, sore throat, chest pain, palpitations, cough, shortness of breath, wheezing, nausea, vomiting, diarrhea, constipation, dysuria, frequency, abdominal pain, hematuria, new muscle pain, new joint pain, headaches.     Past Medical History:  Diagnosis Date   Abnormal pap 1997/1998   cryo   Anemia    HLD (hyperlipidemia)    diet controlled, no meds   Hypertension    no meds currently   Scoliosis     Past Surgical History:  Procedure Laterality Date   ANTERIOR CERVICAL DECOMP/DISCECTOMY FUSION N/A 12/04/2019   Procedure: CERVICAL FIVE-SIX, CERVICAL SIX-SEVEN ANTERIOR CERVICAL DECOMPRESSION/DISCECTOMY FUSION ALLOGRAFT PLATE;  Surgeon: [name], MD;  Location: [facility] OR;  Service: Orthopedics;  Laterality: N/A;   COLONOSCOPY     REFRACTIVE SURGERY Bilateral 2012   Dr Nile Riggs   UPPER GI ENDOSCOPY     WISDOM TOOTH EXTRACTION  age 46's    Family History  Problem Relation Age of Onset   COPD Mother    Osteoporosis Mother    Pulmonary embolism Mother    Alzheimer's disease Mother    Diabetes Father    Hypertension Father    Colon polyps Father    Hypertension Sister    Hypertension Brother    Stroke Maternal Grandmother 67   Cancer Paternal Grandmother 23       multiple myloma   Cancer Paternal Grandfather        ? kind cancer    Social History   Socioeconomic History   Marital status: Married    Spouse name: Not on file   Number of children: Not on file   Years of education: Not on file    Highest education level: Not on file  Occupational History   Occupation: [employer] trader  Tobacco Use   Smoking status: Never   Smokeless tobacco: Never  Vaping Use   Vaping Use: Never used  Substance and Sexual Activity   Alcohol use: Yes    Alcohol/week: 3.0 standard drinks of alcohol    Types: 3 Standard drinks or equivalent per week    Comment: Beer/Wine   Drug use: No   Sexual activity: Yes    Partners: Male    Birth control/protection: Post-menopausal  Other Topics Concern   Not on file  Social History Narrative   Exercise-  walking   Social Determinants of Health   Financial Resource Strain: Not on file  Food Insecurity: Not on file  Transportation Needs: Not on file  Physical Activity: Not on file  Stress: Not on file  Social Connections: Not on file  Intimate Partner Violence: Not on file    Outpatient Medications Prior to Visit  Medication Sig Dispense Refill   Multiple Vitamins-Minerals (MULTIVITAMIN PO) Take 1 tablet by mouth daily.      psyllium (REGULOID) 0.52 g capsule Take 0.52 g by mouth daily.     alendronate (FOSAMAX) 70 MG tablet TAKE 1 TABLET EVERY 7 DAYS WITH A FULL  GLASS OF WATER ON AN EMPTY STOMACH 12 tablet 3   ALPRAZolam (XANAX) 0.25 MG tablet Take 1 tablet (0.25 mg total) by mouth 2 (two) times daily as needed for anxiety. 20 tablet 0   No facility-administered medications prior to visit.    No Known Allergies  Review of Systems  Constitutional:  Negative for fever.  HENT:  Negative for congestion, sinus pain and sore throat.   Respiratory:  Negative for cough, shortness of breath and wheezing.   Cardiovascular:  Negative for chest pain and palpitations.  Gastrointestinal:  Negative for abdominal pain, constipation, diarrhea, nausea and vomiting.  Genitourinary:  Negative for dysuria, frequency and hematuria.  Musculoskeletal:  Negative for joint pain and myalgias.  Neurological:  Negative for headaches.        Objective:    Physical Exam Constitutional:      Appearance: Normal appearance. She is not ill-appearing.  HENT:     Right Ear: Tympanic membrane, ear canal and external ear normal.     Left Ear: Tympanic membrane, ear canal and external ear normal.  Eyes:     Extraocular Movements: Extraocular movements intact.     Pupils: Pupils are equal, round, and reactive to light.  Cardiovascular:     Rate and Rhythm: Normal rate and regular rhythm.     Pulses: Normal pulses.     Heart sounds: Normal heart sounds. No murmur heard.    No gallop.  Pulmonary:     Effort: Pulmonary effort is normal. No respiratory distress.     Breath sounds: Normal breath sounds. No wheezing or rales.  Abdominal:     General: Bowel sounds are normal. There is no distension.     Palpations: Abdomen is soft.     Tenderness: There is no abdominal tenderness. There is no guarding.  Skin:    General: Skin is warm and dry.  Neurological:     Mental Status: She is alert and oriented to person, place, and time.  Psychiatric:        Judgment: Judgment normal.     BP 124/82 (BP Location: Left Arm, Patient Position: Sitting, Cuff Size: Small)   Pulse 83   Temp 98.7 F (37.1 C) (Oral)   Resp 16   Ht 4\' 11"  (1.499 m)   Wt 91 lb 6.4 oz (41.5 kg)   LMP 02/24/2015   SpO2 96%   BMI 18.46 kg/m  Wt Readings from Last 3 Encounters:  04/07/22 91 lb 6.4 oz (41.5 kg)  11/06/20 103 lb (46.7 kg)  09/16/20 104 lb (47.2 kg)    Diabetic Foot Exam - Simple   No data filed    Lab Results  Component Value Date   WBC 6.1 04/01/2021   HGB 13.3 04/01/2021   HCT 40.0 04/01/2021   PLT 279.0 04/01/2021   GLUCOSE 86 04/01/2021   CHOL 209 (H) 04/01/2021   TRIG 146.0 04/01/2021   HDL 90.60 04/01/2021   LDLDIRECT 123.0 03/22/2016   LDLCALC 89 04/01/2021   ALT 12 04/01/2021   AST 20 04/01/2021   NA 142 04/01/2021   K 4.6 04/01/2021   CL 102 04/01/2021   CREATININE 0.62 04/01/2021   BUN 14 04/01/2021   CO2 29  04/01/2021   TSH 2.89 04/01/2021   INR 1.1 08/08/2020    Lab Results  Component Value Date   TSH 2.89 04/01/2021   Lab Results  Component Value Date   WBC 6.1 04/01/2021   HGB 13.3 04/01/2021   HCT 40.0  04/01/2021   MCV 93.5 04/01/2021   PLT 279.0 04/01/2021   Lab Results  Component Value Date   NA 142 04/01/2021   K 4.6 04/01/2021   CO2 29 04/01/2021   GLUCOSE 86 04/01/2021   BUN 14 04/01/2021   CREATININE 0.62 04/01/2021   BILITOT 0.4 04/01/2021   ALKPHOS 74 04/01/2021   AST 20 04/01/2021   ALT 12 04/01/2021   PROT 7.5 04/01/2021   ALBUMIN 4.7 04/01/2021   CALCIUM 9.3 04/01/2021   ANIONGAP 11 08/08/2020   GFR 97.15 04/01/2021   Lab Results  Component Value Date   CHOL 209 (H) 04/01/2021   Lab Results  Component Value Date   HDL 90.60 04/01/2021   Lab Results  Component Value Date   LDLCALC 89 04/01/2021   Lab Results  Component Value Date   TRIG 146.0 04/01/2021   Lab Results  Component Value Date   CHOLHDL 2 04/01/2021   No results found for: "HGBA1C"     Mammogram- Last completed 11/05/2021.  Bone Density- Last completed 03/24/2020. Colonoscopy- Last completed 02/29/2012. Follow up in 7 years, overdue since 03/01/2019. Pap Smear- Last completed 02/20/2020.  Results:  High risk HPV Negative   Adequacy Satisfactory for evaluation; transformation zone component PRESENT.   Diagnosis - Negative for intraepithelial lesion or malignancy (NILM)   Comment Normal Reference Range HPV - Negative   Follow up in 3 years.  Assessment & Plan:   Problem List Items Addressed This Visit       Unprioritized   Preventative health care - Primary   Relevant Orders   CBC with Differential/Platelet   Comprehensive metabolic panel   Lipid panel   TSH   Other Visit Diagnoses     Age-related osteoporosis without current pathological fracture       Relevant Medications   alendronate (FOSAMAX) 70 MG tablet   Cough in adult       Relevant Medications    amitriptyline (ELAVIL) 10 MG tablet        Meds ordered this encounter  Medications   alendronate (FOSAMAX) 70 MG tablet    Sig: TAKE 1 TABLET EVERY 7 DAYS WITH A FULL GLASS OF WATER ON AN EMPTY STOMACH    Dispense:  12 tablet    Refill:  3   amitriptyline (ELAVIL) 10 MG tablet    Sig: Take 1 tablet (10 mg total) by mouth at bedtime.    Dispense:  90 tablet    Refill:  1    I, Nicole Schultz, DO, personally preformed the services described in this documentation.  All medical record entries made by the scribe were at my direction and in my presence.  I have reviewed the chart and discharge instructions (if applicable) and agree that the record reflects my personal performance and is accurate and complete. 04/07/2022   I,Tinashe Williams,acting as a scribe for Nicole Schultz, DO.,have documented all relevant documentation on the behalf of Nicole Schultz, DO,as directed by  Nicole Schultz, DO while in the presence of Nicole Schultz, DO.    Nicole Schultz, DO

## 2022-04-07 NOTE — Progress Notes (Signed)
Subjective:     Nicole Blackburn is a 61 y.o. female and is here for a comprehensive physical exam. The patient reports no problems.  Social History   Socioeconomic History   Marital status: Married    Spouse name: Not on file   Number of children: Not on file   Years of education: Not on file   Highest education level: Not on file  Occupational History   Occupation: [employer] trader  Tobacco Use   Smoking status: Never   Smokeless tobacco: Never  Vaping Use   Vaping Use: Never used  Substance and Sexual Activity   Alcohol use: Yes    Alcohol/week: 3.0 standard drinks of alcohol    Types: 3 Standard drinks or equivalent per week    Comment: Beer/Wine   Drug use: No   Sexual activity: Yes    Partners: Male    Birth control/protection: Post-menopausal  Other Topics Concern   Not on file  Social History Narrative   Exercise-  walking   Social Determinants of Health   Financial Resource Strain: Not on file  Food Insecurity: Not on file  Transportation Needs: Not on file  Physical Activity: Not on file  Stress: Not on file  Social Connections: Not on file  Intimate Partner Violence: Not on file   Health Maintenance  Topic Date Due   COLONOSCOPY (Pts 45-30yrs Insurance coverage will need to be confirmed)  03/01/2019   INFLUENZA VACCINE  04/12/2022   MAMMOGRAM  11/05/2022   PAP SMEAR-Modifier  02/20/2023   TETANUS/TDAP  11/15/2028   Hepatitis C Screening  Completed   HIV Screening  Completed   Zoster Vaccines- Shingrix  Completed   HPV VACCINES  Aged Out   COVID-19 Vaccine  Discontinued    The following portions of the patient's history were reviewed and updated as appropriate: She  has a past medical history of Abnormal pap (1997/1998), Anemia, HLD (hyperlipidemia), Hypertension, and Scoliosis. She does not have any pertinent problems on file. She  has a past surgical history that includes Refractive surgery (Bilateral, 2012); Wisdom tooth extraction  (age 17's); Upper gi endoscopy; Colonoscopy; and Anterior cervical decomp/discectomy fusion (N/A, 12/04/2019). Her family history includes Alzheimer's disease in her mother; COPD in her mother; Cancer in her paternal grandfather; Cancer (age of onset: 56) in her paternal grandmother; Colon polyps in her father; Diabetes in her father; Hypertension in her brother, father, and sister; Osteoporosis in her mother; Pulmonary embolism in her mother; Stroke (age of onset: 17) in her maternal grandmother. She  reports that she has never smoked. She has never used smokeless tobacco. She reports current alcohol use of about 3.0 standard drinks of alcohol per week. She reports that she does not use drugs. She has a current medication list which includes the following prescription(s): amitriptyline, multiple vitamin, psyllium, and alendronate. Current Outpatient Medications on File Prior to Visit  Medication Sig Dispense Refill   Multiple Vitamins-Minerals (MULTIVITAMIN PO) Take 1 tablet by mouth daily.      psyllium (REGULOID) 0.52 g capsule Take 0.52 g by mouth daily.     No current facility-administered medications on file prior to visit.   She has No Known Allergies..  Review of Systems Review of Systems  Constitutional: Negative for activity change, appetite change and fatigue.  HENT: Negative for hearing loss, congestion, tinnitus and ear discharge.  dentist q44m Eyes: Negative for visual disturbance (see optho q1y -- vision corrected to 20/20 with glasses).  Respiratory: Negative for cough, chest  tightness and shortness of breath.   Cardiovascular: Negative for chest pain, palpitations and leg swelling.  Gastrointestinal: Negative for abdominal pain, diarrhea, constipation and abdominal distention.  Genitourinary: Negative for urgency, frequency, decreased urine volume and difficulty urinating.  Musculoskeletal: Negative for back pain, arthralgias and gait problem.  Skin: Negative for color change,  pallor and rash.  Neurological: Negative for dizziness, light-headedness, numbness and headaches.  Hematological: Negative for adenopathy. Does not bruise/bleed easily.  Psychiatric/Behavioral: Negative for suicidal ideas, confusion, sleep disturbance, self-injury, dysphoric mood, decreased concentration and agitation.      Objective:    BP 124/82 (BP Location: Left Arm, Patient Position: Sitting, Cuff Size: Small)   Pulse 83   Temp 98.7 F (37.1 C) (Oral)   Resp 16   Ht 4\' 11"  (1.499 m)   Wt 91 lb 6.4 oz (41.5 kg)   LMP 02/24/2015   SpO2 96%   BMI 18.46 kg/m  General appearance: alert, cooperative, appears stated age, and no distress Head: Normocephalic, without obvious abnormality, atraumatic Eyes: conjunctivae/corneas clear. PERRL, EOM's intact. Fundi benign. Ears: normal TM's and external ear canals both ears Nose: Nares normal. Septum midline. Mucosa normal. No drainage or sinus tenderness. Throat: lips, mucosa, and tongue normal; teeth and gums normal Neck: no adenopathy, no carotid bruit, no JVD, supple, symmetrical, trachea midline, and thyroid not enlarged, symmetric, no tenderness/mass/nodules Back: symmetric, no curvature. ROM normal. No CVA tenderness. Lungs: clear to auscultation bilaterally Heart: regular rate and rhythm, S1, S2 normal, no murmur, click, rub or gallop Abdomen: soft, non-tender; bowel sounds normal; no masses,  no organomegaly Extremities: extremities normal, atraumatic, no cyanosis or edema Pulses: 2+ and symmetric Skin: Skin color, texture, turgor normal. No rashes or lesions Lymph nodes: Cervical, supraclavicular, and axillary nodes normal. Neurologic: Alert and oriented X 3, normal strength and tone. Normal symmetric reflexes. Normal coordination and gait    Assessment:    Healthy female exam.      Plan:    Ghm utd Check labs  See After Visit Summary for Counseling Recommendations   1. Preventative health care See above  - CBC with  Differential/Platelet - Comprehensive metabolic panel - Lipid panel - TSH  2. Age-related osteoporosis without current pathological fracture  - alendronate (FOSAMAX) 70 MG tablet; TAKE 1 TABLET EVERY 7 DAYS WITH A FULL GLASS OF WATER ON AN EMPTY STOMACH  Dispense: 12 tablet; Refill: 3  3. Cough in adult Pt would like to try elavil because her mother took it and it worked for her  - amitriptyline (ELAVIL) 10 MG tablet; Take 1 tablet (10 mg total) by mouth at bedtime.  Dispense: 90 tablet; Refill: 1

## 2022-04-07 NOTE — Assessment & Plan Note (Signed)
ghm utd Check labs  See avs  

## 2022-04-07 NOTE — Assessment & Plan Note (Signed)
Elavil qpm  Her mother took this for a cough and it helped her

## 2022-04-08 LAB — CBC WITH DIFFERENTIAL/PLATELET
Basophils Absolute: 0.1 10*3/uL (ref 0.0–0.1)
Basophils Relative: 1.1 % (ref 0.0–3.0)
Eosinophils Absolute: 0 10*3/uL (ref 0.0–0.7)
Eosinophils Relative: 0.5 % (ref 0.0–5.0)
HCT: 39.9 % (ref 36.0–46.0)
Hemoglobin: 13.1 g/dL (ref 12.0–15.0)
Lymphocytes Relative: 32.1 % (ref 12.0–46.0)
Lymphs Abs: 1.8 10*3/uL (ref 0.7–4.0)
MCHC: 32.9 g/dL (ref 30.0–36.0)
MCV: 97.3 fl (ref 78.0–100.0)
Monocytes Absolute: 0.4 10*3/uL (ref 0.1–1.0)
Monocytes Relative: 7 % (ref 3.0–12.0)
Neutro Abs: 3.4 10*3/uL (ref 1.4–7.7)
Neutrophils Relative %: 59.3 % (ref 43.0–77.0)
Platelets: 241 10*3/uL (ref 150.0–400.0)
RBC: 4.1 Mil/uL (ref 3.87–5.11)
RDW: 12.7 % (ref 11.5–15.5)
WBC: 5.7 10*3/uL (ref 4.0–10.5)

## 2022-04-08 LAB — LIPID PANEL
Cholesterol: 205 mg/dL — ABNORMAL HIGH (ref 0–200)
HDL: 89.4 mg/dL (ref >39.00)
LDL Cholesterol: 88 mg/dL (ref 0–99)
NonHDL: 115.84
Total CHOL/HDL Ratio: 2
Triglycerides: 138 mg/dL (ref 0.0–149.0)
VLDL: 27.6 mg/dL (ref 0.0–40.0)

## 2022-04-08 LAB — COMPREHENSIVE METABOLIC PANEL
ALT: 12 U/L (ref 0–35)
AST: 22 U/L (ref 0–37)
Albumin: 4.6 g/dL (ref 3.5–5.2)
Alkaline Phosphatase: 62 U/L (ref 39–117)
BUN: 16 mg/dL (ref 6–23)
CO2: 29 mEq/L (ref 19–32)
Calcium: 9.4 mg/dL (ref 8.4–10.5)
Chloride: 102 mEq/L (ref 96–112)
Creatinine, Ser: 0.63 mg/dL (ref 0.40–1.20)
GFR: 96.09 mL/min (ref >60.00)
Glucose, Bld: 82 mg/dL (ref 70–99)
Potassium: 4.3 mEq/L (ref 3.5–5.1)
Sodium: 140 mEq/L (ref 135–145)
Total Bilirubin: 0.7 mg/dL (ref 0.2–1.2)
Total Protein: 7.4 g/dL (ref 6.0–8.3)

## 2022-04-08 LAB — TSH: TSH: 1.83 u[IU]/mL (ref 0.35–5.50)

## 2022-06-20 ENCOUNTER — Other Ambulatory Visit: Payer: Self-pay | Admitting: Family Medicine

## 2022-06-20 DIAGNOSIS — M810 Age-related osteoporosis without current pathological fracture: Secondary | ICD-10-CM

## 2022-07-05 ENCOUNTER — Other Ambulatory Visit: Payer: Self-pay | Admitting: *Deleted

## 2022-07-05 DIAGNOSIS — R059 Cough, unspecified: Secondary | ICD-10-CM

## 2022-07-05 MED ORDER — AMITRIPTYLINE HCL 10 MG PO TABS: 10.0000 mg | ORAL_TABLET | Freq: Every day | ORAL | 1 refills | Status: DC

## 2022-09-29 ENCOUNTER — Other Ambulatory Visit: Payer: Self-pay | Admitting: Family Medicine

## 2022-09-29 DIAGNOSIS — R059 Cough, unspecified: Secondary | ICD-10-CM

## 2022-11-04 DIAGNOSIS — I8311 Varicose veins of right lower extremity with inflammation: Secondary | ICD-10-CM | POA: Insufficient documentation

## 2022-11-06 ENCOUNTER — Encounter: Payer: Self-pay | Admitting: Family Medicine

## 2022-11-07 ENCOUNTER — Other Ambulatory Visit: Payer: Self-pay | Admitting: Family Medicine

## 2022-11-07 DIAGNOSIS — I8310 Varicose veins of unspecified lower extremity with inflammation: Secondary | ICD-10-CM

## 2022-11-07 DIAGNOSIS — L089 Local infection of the skin and subcutaneous tissue, unspecified: Secondary | ICD-10-CM

## 2022-11-10 ENCOUNTER — Encounter: Payer: Self-pay | Admitting: Podiatry

## 2022-11-10 ENCOUNTER — Ambulatory Visit: Payer: Managed Care, Other (non HMO) | Admitting: Podiatry

## 2022-11-10 DIAGNOSIS — R197 Diarrhea, unspecified: Secondary | ICD-10-CM

## 2022-11-10 DIAGNOSIS — R634 Abnormal weight loss: Secondary | ICD-10-CM | POA: Insufficient documentation

## 2022-11-10 DIAGNOSIS — R0989 Other specified symptoms and signs involving the circulatory and respiratory systems: Secondary | ICD-10-CM | POA: Diagnosis not present

## 2022-11-10 HISTORY — DX: Diarrhea, unspecified: R19.7

## 2022-11-10 HISTORY — DX: Abnormal weight loss: R63.4

## 2022-11-10 MED ORDER — AMOXICILLIN-POT CLAVULANATE 875-125 MG PO TABS: 1.0000 | ORAL_TABLET | Freq: Two times a day (BID) | ORAL | 0 refills | Status: DC

## 2022-11-10 MED ORDER — TERBINAFINE HCL 250 MG PO TABS
250.0000 mg | ORAL_TABLET | Freq: Every day | ORAL | 0 refills | Status: DC
Start: 2022-11-10 — End: 2023-04-20

## 2022-11-10 NOTE — Progress Notes (Signed)
Subjective:  Patient ID: Nicole Blackburn, female    DOB: [birth year],  MRN: [id number] HPI Chief Complaint  Patient presents with   Toe Pain    Toes bilateral - redness and itching x 3 weeks, toes sometimes look purple, went to Urgent Care in [facility] while on a trip and they said she needed a circulation test, also Rxd antibiotic and she also has been using neosporin in them   New Patient (Initial Visit)    62 y.o. female presents with the above complaint.   ROS: Denies fever chills nausea vomit muscle aches pains calf pain back pain chest pain shortness of breath.  Past Medical History:  Diagnosis Date   Abnormal pap 1997/1998   cryo   Anemia    HLD (hyperlipidemia)    diet controlled, no meds   Hypertension    no meds currently   Scoliosis    Past Surgical History:  Procedure Laterality Date   ANTERIOR CERVICAL DECOMP/DISCECTOMY FUSION N/A 12/04/2019   Procedure: CERVICAL FIVE-SIX, CERVICAL SIX-SEVEN ANTERIOR CERVICAL DECOMPRESSION/DISCECTOMY FUSION ALLOGRAFT PLATE;  Surgeon: Marybelle Killings, MD;  Location: [place];  Service: Orthopedics;  Laterality: N/A;   COLONOSCOPY     REFRACTIVE SURGERY Bilateral 2012   Dr Gershon Crane   UPPER GI ENDOSCOPY     WISDOM TOOTH EXTRACTION  age 64's    Current Outpatient Medications:    alendronate (FOSAMAX) 70 MG tablet, TAKE 1 TABLET EVERY 7 DAYS WITH A FULL GLASS OF WATER ON AN EMPTY STOMACH, Disp: 12 tablet, Rfl: 3   amitriptyline (ELAVIL) 10 MG tablet, Take 1 tablet (10 mg total) by mouth at bedtime., Disp: 90 tablet, Rfl: 1   amoxicillin-clavulanate (AUGMENTIN) 875-125 MG tablet, Take 1 tablet by mouth 2 (two) times daily., Disp: 20 tablet, Rfl: 0   terbinafine (LAMISIL) 250 MG tablet, Take 1 tablet (250 mg total) by mouth daily., Disp: 30 tablet, Rfl: 0  No Known Allergies Review of Systems Objective:  There were no vitals filed for this visit.  General: Well developed, nourished, in no acute distress, alert and oriented x3    Dermatological: Skin is warm, dry and supple bilateral. Nails x 10 are well maintained; remaining integument appears unremarkable at this time. There are no open sores, no preulcerative lesions, no rash or signs of infection present.  Vascular: Dorsalis Pedis artery and Posterior Tibial artery pedal pulses are 2/4 bilateral with immedate capillary fill time. Pedal hair growth present. No varicosities and no lower extremity edema present bilateral.  She does have considerable vasospastic type disorder to her entire forefoot bilaterally.  Her toes are somewhat cyanotic but do blanch and refill.  There is no signs of infection and all of the toes on the left foot however the toes of the right foot #3 #4 do demonstrate some skin breakdown distally with a very superficial mild ulcerative area.  I think this will go on and heal once we get on an antibiotic  Neruologic: Grossly intact via light touch bilateral. Vibratory intact via tuning fork bilateral. Protective threshold with Semmes Wienstein monofilament intact to all pedal sites bilateral. Patellar and Achilles deep tendon reflexes 2+ bilateral. No Babinski or clonus noted bilateral.   Musculoskeletal: No gross boney pedal deformities bilateral. No pain, crepitus, or limitation noted with foot and ankle range of motion bilateral. Muscular strength 5/5 in all groups tested bilateral.  Gait: Unassisted, Nonantalgic.    Radiographs:  None taken  Assessment & Plan:   Assessment: Vascular disease small vessel disease vasospastic  disorder such as Raynaud's  Plan: Started her on Augmentin and we are going to send her for vascular studies including ABIs and TBI's.  Follow-up with her once those come in     [place] [place], [place]

## 2022-11-14 LAB — HM MAMMOGRAPHY

## 2022-11-16 ENCOUNTER — Encounter: Payer: Self-pay | Admitting: Family Medicine

## 2022-11-24 ENCOUNTER — Ambulatory Visit (HOSPITAL_COMMUNITY)
Admission: RE | Admit: 2022-11-24 | Discharge: 2022-11-24 | Disposition: A | Discharge status: Home / Self Care | Payer: Managed Care, Other (non HMO) | Source: Ambulatory Visit | Attending: Podiatry | Admitting: Podiatry

## 2022-11-24 DIAGNOSIS — R0989 Other specified symptoms and signs involving the circulatory and respiratory systems: Secondary | ICD-10-CM | POA: Diagnosis present

## 2022-11-25 ENCOUNTER — Telehealth: Payer: Self-pay | Admitting: *Deleted

## 2022-11-25 LAB — VAS US ABI WITH/WO TBI
Left ABI: 0.99
Right ABI: 1.06

## 2022-11-25 NOTE — Telephone Encounter (Signed)
Called patient and she has been updated concerning results, verbalized understanding.

## 2022-11-25 NOTE — Telephone Encounter (Signed)
-----   Message from Rip Harbour, [facility] sent at 11/25/2022  9:30 AM EDT -----  ----- Message ----- From: Garrel Ridgel, DPM Sent: 11/24/2022   4:43 PM EDT To: Avanell Shackleton Prevette, PMAC  ABI slightly decreased but with in normal limits.

## 2022-12-22 ENCOUNTER — Ambulatory Visit: Payer: Managed Care, Other (non HMO) | Admitting: Podiatry

## 2023-04-20 ENCOUNTER — Ambulatory Visit: Payer: Managed Care, Other (non HMO) | Admitting: Family Medicine

## 2023-04-20 ENCOUNTER — Encounter: Payer: Self-pay | Admitting: Family Medicine

## 2023-04-20 VITALS — BP 114/70 | HR 88 | Temp 98.4°F | Resp 16 | Ht 59.0 in | Wt 98.0 lb

## 2023-04-20 DIAGNOSIS — R6889 Other general symptoms and signs: Secondary | ICD-10-CM

## 2023-04-20 DIAGNOSIS — I10 Essential (primary) hypertension: Secondary | ICD-10-CM

## 2023-04-20 DIAGNOSIS — Z0000 Encounter for general adult medical examination without abnormal findings: Secondary | ICD-10-CM | POA: Diagnosis not present

## 2023-04-20 DIAGNOSIS — R059 Cough, unspecified: Secondary | ICD-10-CM

## 2023-04-20 DIAGNOSIS — R0989 Other specified symptoms and signs involving the circulatory and respiratory systems: Secondary | ICD-10-CM | POA: Diagnosis not present

## 2023-04-20 DIAGNOSIS — M810 Age-related osteoporosis without current pathological fracture: Secondary | ICD-10-CM | POA: Diagnosis not present

## 2023-04-20 LAB — CBC WITH DIFFERENTIAL/PLATELET
Basophils Absolute: 0 10*3/uL (ref 0.0–0.1)
Basophils Relative: 0.7 % (ref 0.0–3.0)
Eosinophils Absolute: 0.1 10*3/uL (ref 0.0–0.7)
Eosinophils Relative: 1.5 % (ref 0.0–5.0)
HCT: 40 % (ref 36.0–46.0)
Hemoglobin: 13.2 g/dL (ref 12.0–15.0)
Lymphocytes Relative: 30 % (ref 12.0–46.0)
Lymphs Abs: 1.5 10*3/uL (ref 0.7–4.0)
MCHC: 33.1 g/dL (ref 30.0–36.0)
MCV: 95 fl (ref 78.0–100.0)
Monocytes Absolute: 0.3 10*3/uL (ref 0.1–1.0)
Monocytes Relative: 6.7 % (ref 3.0–12.0)
Neutro Abs: 3.1 10*3/uL (ref 1.4–7.7)
Neutrophils Relative %: 61.1 % (ref 43.0–77.0)
Platelets: 264 10*3/uL (ref 150.0–400.0)
RBC: 4.21 Mil/uL (ref 3.87–5.11)
RDW: 12.5 % (ref 11.5–15.5)
WBC: 5.1 10*3/uL (ref 4.0–10.5)

## 2023-04-20 LAB — COMPREHENSIVE METABOLIC PANEL WITH GFR
ALT: 15 U/L (ref 0–35)
AST: 23 U/L (ref 0–37)
Albumin: 4.6 g/dL (ref 3.5–5.2)
Alkaline Phosphatase: 80 U/L (ref 39–117)
BUN: 16 mg/dL (ref 6–23)
CO2: 29 meq/L (ref 19–32)
Calcium: 9.4 mg/dL (ref 8.4–10.5)
Chloride: 100 meq/L (ref 96–112)
Creatinine, Ser: 0.71 mg/dL (ref 0.40–1.20)
GFR: 91.43 mL/min
Glucose, Bld: 78 mg/dL (ref 70–99)
Potassium: 4.2 meq/L (ref 3.5–5.1)
Sodium: 139 meq/L (ref 135–145)
Total Bilirubin: 0.8 mg/dL (ref 0.2–1.2)
Total Protein: 7.3 g/dL (ref 6.0–8.3)

## 2023-04-20 LAB — VITAMIN D 25 HYDROXY (VIT D DEFICIENCY, FRACTURES): VITD: 28.39 ng/mL — ABNORMAL LOW (ref 30.00–100.00)

## 2023-04-20 LAB — LIPID PANEL
Cholesterol: 287 mg/dL — ABNORMAL HIGH (ref 0–200)
HDL: 78.3 mg/dL
Total CHOL/HDL Ratio: 4
Triglycerides: 641 mg/dL — ABNORMAL HIGH (ref 0.0–149.0)

## 2023-04-20 LAB — LDL CHOLESTEROL, DIRECT: Direct LDL: 109 mg/dL

## 2023-04-20 LAB — TSH: TSH: 2.84 u[IU]/mL (ref 0.35–5.50)

## 2023-04-20 MED ORDER — AMITRIPTYLINE HCL 10 MG PO TABS: 10.0000 mg | ORAL_TABLET | Freq: Every day | ORAL | 1 refills | Status: DC

## 2023-04-20 MED ORDER — ALENDRONATE SODIUM 70 MG PO TABS: ORAL_TABLET | ORAL | 3 refills | Status: DC

## 2023-04-20 NOTE — Assessment & Plan Note (Signed)
Ghm utd Check labs  See AVS Health Maintenance  Topic Date Due   PAP SMEAR-Modifier  02/20/2023   INFLUENZA VACCINE  04/13/2023   MAMMOGRAM  11/14/2023   DTaP/Tdap/Td (4 - Td or Tdap) 11/15/2028   Colonoscopy  06/10/2029   Hepatitis C Screening  Completed   HIV Screening  Completed   Zoster Vaccines- Shingrix  Completed   HPV VACCINES  Aged Out   COVID-19 Vaccine  Discontinued

## 2023-04-20 NOTE — Assessment & Plan Note (Signed)
Well controlled, no changes to meds. Encouraged heart healthy diet such as the DASH diet and exercise as tolerated.  °

## 2023-04-20 NOTE — Progress Notes (Signed)
Established Patient Office Visit  Subjective   Patient ID: Nicole Blackburn, female    DOB: 06-May-1961  Age: 62 y.o. MRN: [id number]  Chief Complaint  Patient presents with   Annual Exam    Pt states fasting     HPI Discussed the use of AI scribe software for clinical note transcription with the patient, who gave verbal consent to proceed.  History of Present Illness   The patient presents with a concern about her feet and cpe.   She reports that her toes and heel turn red when exposed to heat or sunlight, but appear "purpley" when indoors. She also experiences itchiness. She has had an ABI test, which showed slightly decreased but within normal limits. She has not had any follow-up since the test.  The patient also mentions that her back is doing well. She has stopped attending counseling due to difficulty getting appointments, but has been attending a support group sponsored by [employer]. She has not had a Pap smear in a while and is due for one this year. She continues to work at the [employer] and do some walking. She also reports a decrease in pulses in her feet.      Patient Active Problem List   Diagnosis Date Noted   Diarrhea 11/10/2022   Abnormal weight loss 11/10/2022   Varicose veins of right lower extremity with inflammation 11/04/2022   Age-related osteoporosis without current pathological fracture 04/07/2022   Preventative health care 04/01/2021   Impingement syndrome of right shoulder 03/01/2021   Trochanteric bursitis, left hip 11/06/2020   Femoral neck fracture (HCC) 08/12/2020   S/P cervical spinal fusion 12/11/2019   Cervical spinal stenosis 12/04/2019   Other spondylosis with radiculopathy, cervical region 03/20/2019   Dizzy 09/30/2018   History of scoliosis 09/30/2018   Right hip pain 09/30/2018   Allergies 09/30/2018   Rash 03/25/2018   Fever 03/25/2018   Muscle spasm 03/25/2018   HTN (hypertension) 07/20/2015   Cough in adult 12/11/2012   Idiopathic  scoliosis 12/11/2012   Anemia    Past Medical History:  Diagnosis Date   Abnormal pap 1997/1998   cryo   Anemia    HLD (hyperlipidemia)    diet controlled, no meds   Hypertension    no meds currently   Scoliosis    Past Surgical History:  Procedure Laterality Date   ANTERIOR CERVICAL DECOMP/DISCECTOMY FUSION N/A 12/04/2019   Procedure: CERVICAL FIVE-SIX, CERVICAL SIX-SEVEN ANTERIOR CERVICAL DECOMPRESSION/DISCECTOMY FUSION ALLOGRAFT PLATE;  Surgeon: [name], MD;  Location[facility];  Service: Orthopedics;  Laterality: N/A;   COLONOSCOPY     REFRACTIVE SURGERY Bilateral 2012   Dr Nile Riggs   UPPER GI ENDOSCOPY     WISDOM TOOTH EXTRACTION  age 63's   Social History   Tobacco Use   Smoking status: Never   Smokeless tobacco: Never  Vaping Use   Vaping status: Never Used  Substance Use Topics   Alcohol use: Yes    Alcohol/week: 3.0 standard drinks of alcohol    Types: 3 Standard drinks or equivalent per week    Comment: Beer/Wine   Drug use: No   Social History   Socioeconomic History   Marital status: Married    Spouse name: Not on file   Number of children: Not on file   Years of education: Not on file   Highest education level: Not on file  Occupational History   Occupation: [employer] trader  Tobacco Use   Smoking status: Never  Smokeless tobacco: Never  Vaping Use   Vaping status: Never Used  Substance and Sexual Activity   Alcohol use: Yes    Alcohol/week: 3.0 standard drinks of alcohol    Types: 3 Standard drinks or equivalent per week    Comment: Beer/Wine   Drug use: No   Sexual activity: Yes    Partners: Male    Birth control/protection: Post-menopausal  Other Topics Concern   Not on file  Social History Narrative   Exercise-  walking   Social Determinants of Health   Financial Resource Strain: Not on file  Food Insecurity: Not on file  Transportation Needs: Not on file  Physical Activity: Not on file  Stress: Not on file   Social Connections: Not on file  Intimate Partner Violence: Not on file   Family Status  Relation Name Status   Mother  Alive   Father  Alive   Sister  Alive   Brother  Alive   Brother  Alive   MGM  Deceased   MGF  Deceased   PGM  Deceased   PGF  Deceased  No partnership data on file   Family History  Problem Relation Age of Onset   COPD Mother    Osteoporosis Mother    Pulmonary embolism Mother    Alzheimer's disease Mother    Diabetes Father    Hypertension Father    Colon polyps Father    Hypertension Sister    Hypertension Brother    Stroke Maternal Grandmother 49   Cancer Paternal Grandmother 88       multiple myloma   Cancer Paternal Grandfather        ? kind cancer   No Known Allergies    Review of Systems  Constitutional:  Negative for chills, fever and malaise/fatigue.  HENT:  Negative for congestion and hearing loss.   Eyes:  Negative for blurred vision and discharge.  Respiratory:  Negative for cough, sputum production and shortness of breath.   Cardiovascular:  Negative for chest pain, palpitations and leg swelling.  Gastrointestinal:  Negative for abdominal pain, blood in stool, constipation, diarrhea, heartburn, nausea and vomiting.  Genitourinary:  Negative for dysuria, frequency, hematuria and urgency.  Musculoskeletal:  Negative for back pain, falls and myalgias.  Skin:  Negative for rash.  Neurological:  Negative for dizziness, sensory change, loss of consciousness, weakness and headaches.  Endo/Heme/Allergies:  Negative for environmental allergies. Does not bruise/bleed easily.  Psychiatric/Behavioral:  Negative for depression and suicidal ideas. The patient is not nervous/anxious and does not have insomnia.       Objective:     BP 114/70 (BP Location: Left Arm, Patient Position: Sitting, Cuff Size: Small)   Pulse 88   Temp 98.4 F (36.9 C) (Oral)   Resp 16   Ht 4\' 11"  (1.499 m)   Wt 98 lb (44.5 kg)   LMP 02/24/2015   BMI 19.79 kg/m   BP Readings from Last 3 Encounters:  04/20/23 114/70  04/07/22 124/82  04/01/21 110/74   Wt Readings from Last 3 Encounters:  04/20/23 98 lb (44.5 kg)  04/07/22 91 lb 6.4 oz (41.5 kg)  11/06/20 103 lb (46.7 kg)   SpO2 Readings from Last 3 Encounters:  04/07/22 96%  04/01/21 97%  08/08/20 92%      Physical Exam Vitals and nursing note reviewed.  Constitutional:      General: She is not in acute distress.    Appearance: Normal appearance. She is well-developed.  HENT:  Head: Normocephalic and atraumatic.     Right Ear: Tympanic membrane, ear canal and external ear normal. There is no impacted cerumen.     Left Ear: Tympanic membrane, ear canal and external ear normal. There is no impacted cerumen.     Nose: Nose normal.     Mouth/Throat:     Mouth: Mucous membranes are moist.     Pharynx: Oropharynx is clear. No oropharyngeal exudate or posterior oropharyngeal erythema.  Eyes:     General: No scleral icterus.       Right eye: No discharge.        Left eye: No discharge.     Conjunctiva/sclera: Conjunctivae normal.     Pupils: Pupils are equal, round, and reactive to light.  Neck:     Thyroid: No thyromegaly or thyroid tenderness.     Vascular: No JVD.  Cardiovascular:     Rate and Rhythm: Normal rate and regular rhythm.     Heart sounds: Normal heart sounds. No murmur heard. Pulmonary:     Effort: Pulmonary effort is normal. No respiratory distress.     Breath sounds: Normal breath sounds.  Abdominal:     General: Bowel sounds are normal. There is no distension.     Palpations: Abdomen is soft. There is no mass.     Tenderness: There is no abdominal tenderness. There is no guarding or rebound.  Genitourinary:    Vagina: Normal.  Musculoskeletal:        General: Normal range of motion.     Cervical back: Normal range of motion and neck supple.     Right lower leg: No edema.     Left lower leg: No edema.  Feet:     Comments: Decreased pedal pulse  Feet turn  purple when down while sitting and pink up when legs are elevated   Lymphadenopathy:     Cervical: No cervical adenopathy.  Skin:    General: Skin is warm and dry.     Findings: No erythema or rash.  Neurological:     General: No focal deficit present.     Mental Status: She is alert and oriented to person, place, and time.     Cranial Nerves: No cranial nerve deficit.     Sensory: Sensation is intact.     Gait: Gait is intact.     Deep Tendon Reflexes: Reflexes are normal and symmetric.  Psychiatric:        Mood and Affect: Mood normal.        Behavior: Behavior normal.        Thought Content: Thought content normal.        Judgment: Judgment normal.     No results found for any visits on 04/20/23.  Last CBC Lab Results  Component Value Date   WBC 5.7 04/07/2022   HGB 13.1 04/07/2022   HCT 39.9 04/07/2022   MCV 97.3 04/07/2022   MCH 28.7 08/08/2020   RDW 12.7 04/07/2022   PLT 241.0 04/07/2022   Last metabolic panel Lab Results  Component Value Date   GLUCOSE 82 04/07/2022   NA 140 04/07/2022   K 4.3 04/07/2022   CL 102 04/07/2022   CO2 29 04/07/2022   BUN 16 04/07/2022   CREATININE 0.63 04/07/2022   GFR 96.09 04/07/2022   CALCIUM 9.4 04/07/2022   PROT 7.4 04/07/2022   ALBUMIN 4.6 04/07/2022   BILITOT 0.7 04/07/2022   ALKPHOS 62 04/07/2022   AST 22 04/07/2022   ALT 12  04/07/2022   ANIONGAP 11 08/08/2020   Last lipids Lab Results  Component Value Date   CHOL 205 (H) 04/07/2022   HDL 89.40 04/07/2022   LDLCALC 88 04/07/2022   LDLDIRECT 123.0 03/22/2016   TRIG 138.0 04/07/2022   CHOLHDL 2 04/07/2022   Last hemoglobin A1c No results found for: "HGBA1C" Last thyroid functions Lab Results  Component Value Date   TSH 1.83 04/07/2022   Last vitamin D Lab Results  Component Value Date   VD25OH 67.03 03/05/2019   Last vitamin B12 and Folate Lab Results  Component Value Date   VITAMINB12 504 03/05/2019      The 10-year ASCVD risk score (Arnett  DK, et al., 2019) is: 2.2%    Assessment & Plan:   Problem List Items Addressed This Visit       Unprioritized   Cough in adult   Relevant Medications   amitriptyline (ELAVIL) 10 MG tablet   Age-related osteoporosis without current pathological fracture   Relevant Medications   alendronate (FOSAMAX) 70 MG tablet   Other Relevant Orders   VITAMIN D 25 Hydroxy (Vit-D Deficiency, Fractures)   Preventative health care - Primary    Ghm utd Check labs  See AVS Health Maintenance  Topic Date Due   PAP SMEAR-Modifier  02/20/2023   INFLUENZA VACCINE  04/13/2023   MAMMOGRAM  11/14/2023   DTaP/Tdap/Td (4 - Td or Tdap) 11/15/2028   Colonoscopy  06/10/2029   Hepatitis C Screening  Completed   HIV Screening  Completed   Zoster Vaccines- Shingrix  Completed   HPV VACCINES  Aged Out   COVID-19 Vaccine  Discontinued         Relevant Orders   CBC with Differential/Platelet   Comprehensive metabolic panel   Lipid panel   TSH   HTN (hypertension)    Well controlled, no changes to meds. Encouraged heart healthy diet such as the DASH diet and exercise as tolerated.        Other Visit Diagnoses     Decreased pedal pulses       Relevant Orders   Ambulatory referral to Vascular Surgery   Abnormal ankle brachial index (ABI)       Relevant Orders   Ambulatory referral to Vascular Surgery     Assessment and Plan    Foot Discoloration Patient reports redness in toes and heel when exposed to heat or sun, and purplish discoloration when indoors. ABI slightly decreased but within normal limits. No recent follow-up with a vascular specialist. -Refer to vascular specialist for further evaluation and management.  Back Pain Patient reports improvement. -Continue current management.  Cervical Cancer Screening Last Pap smear was three years ago. Patient is due for screening this year. -Offered to perform Pap smear today, but patient can choose to wait until next year.  General Health  Maintenance -Continue regular eye and dental check-ups. -Continue participation in support group sponsored by [employer].        No follow-ups on file.    [name], DO

## 2023-04-21 ENCOUNTER — Other Ambulatory Visit: Payer: Self-pay

## 2023-04-21 MED ORDER — VITAMIN D (ERGOCALCIFEROL) 1.25 MG (50000 UNIT) PO CAPS: 50000.0000 [IU] | ORAL_CAPSULE | ORAL | 1 refills | Status: DC

## 2023-06-09 ENCOUNTER — Encounter: Payer: Managed Care, Other (non HMO) | Admitting: Vascular Surgery

## 2023-06-09 ENCOUNTER — Encounter (HOSPITAL_COMMUNITY): Payer: Managed Care, Other (non HMO)

## 2023-06-20 ENCOUNTER — Other Ambulatory Visit: Payer: Self-pay | Admitting: *Deleted

## 2023-06-20 DIAGNOSIS — R0989 Other specified symptoms and signs involving the circulatory and respiratory systems: Secondary | ICD-10-CM

## 2023-06-26 ENCOUNTER — Other Ambulatory Visit: Payer: Self-pay | Admitting: Family Medicine

## 2023-06-26 DIAGNOSIS — M810 Age-related osteoporosis without current pathological fracture: Secondary | ICD-10-CM

## 2023-06-29 NOTE — Progress Notes (Signed)
Patient ID: Nicole Blackburn, female   DOB: Jun 30, 1961, 62 y.o.   MRN: [id number]  Reason for Consult: New Patient (Initial Visit)   Referred by Zola Button, Grayling Congress, *  Subjective:     HPI  Nicole Blackburn is a 62 y.o. female with a history of HTN, HLD presenting for foot discoloration.  She reports she has some redness in her toes and heel when exposed to heat associated with some itchiness and some purple discoloration when indoors.  She denies claudication, rest pain or history of ulceration.  She denies varicosities or swelling.  She denies swelling or aching.  She denies any history of DVT.  She has not used compression before.  Past Medical History:  Diagnosis Date   Abnormal pap 1997/1998   cryo   Anemia    HLD (hyperlipidemia)    diet controlled, no meds   Hypertension    no meds currently   Scoliosis    Family History  Problem Relation Age of Onset   COPD Mother    Osteoporosis Mother    Pulmonary embolism Mother    Alzheimer's disease Mother    Diabetes Father    Hypertension Father    Colon polyps Father    Hypertension Sister    Hypertension Brother    Stroke Maternal Grandmother 47   Cancer Paternal Grandmother 84       multiple myloma   Cancer Paternal Grandfather        ? kind cancer   Past Surgical History:  Procedure Laterality Date   ANTERIOR CERVICAL DECOMP/DISCECTOMY FUSION N/A 12/04/2019   Procedure: CERVICAL FIVE-SIX, CERVICAL SIX-SEVEN ANTERIOR CERVICAL DECOMPRESSION/DISCECTOMY FUSION ALLOGRAFT PLATE;  Surgeon: [name], MD;  Location: [facility];  Service: Orthopedics;  Laterality: N/A;   COLONOSCOPY     REFRACTIVE SURGERY Bilateral 2012   Dr Nile Riggs   UPPER GI ENDOSCOPY     WISDOM TOOTH EXTRACTION  age 8's    Short Social History:  Social History   Tobacco Use   Smoking status: Never   Smokeless tobacco: Never  Substance Use Topics   Alcohol use: Yes    Alcohol/week: 3.0 standard drinks of alcohol    Types: 3 Standard drinks or equivalent  per week    Comment: Beer/Wine    No Known Allergies  Current Outpatient Medications  Medication Sig Dispense Refill   Vitamin D, Ergocalciferol, (DRISDOL) 1.25 MG (50000 UNIT) CAPS capsule Take 1 capsule (50,000 Units total) by mouth every 7 (seven) days. 12 capsule 1   alendronate (FOSAMAX) 70 MG tablet TAKE 1 TABLET EVERY 7 DAYS WITH A FULL GLASS OF WATER ON AN EMPTY STOMACH 12 tablet 3   amitriptyline (ELAVIL) 10 MG tablet Take 1 tablet (10 mg total) by mouth at bedtime. 90 tablet 1   No current facility-administered medications for this visit.    REVIEW OF SYSTEMS  Negative other than noted in HPI     Objective:  Objective   Vitals:   06/30/23 1431  BP: 113/78  Pulse: 85  Temp: 97.9 F (36.6 C)  SpO2: 97%  Weight: 97 lb 11.2 oz (44.3 kg)  Height: 4\' 11"  (1.499 m)   Body mass index is 19.73 kg/m.  Physical Exam General: no acute distress Cardiac: hemodynamically stable, nontachycardic Pulm: normal work of breathing Neuro: alert, no focal deficit Extremities: venous congestion present bilaterally. Minimal edema, reticular veins noted on feet and bilateral ankles. No varicosities, no evidence of previous ulcer Vascular:   Right: Palpable DP,  PT  Left: Palpable DP, PT   Data: ABI +---------+------------------+-----+---------+--------+  Right   Rt Pressure (mmHg)IndexWaveform Comment   +---------+------------------+-----+---------+--------+  Brachial 115                                       +---------+------------------+-----+---------+--------+  PTA     120               0.98 biphasic           +---------+------------------+-----+---------+--------+  DP      126               1.02 triphasic          +---------+------------------+-----+---------+--------+  Great Toe84                0.68                    +---------+------------------+-----+---------+--------+   +---------+------------------+-----+--------+-------+  Left     Lt Pressure (mmHg)IndexWaveformComment  +---------+------------------+-----+--------+-------+  Brachial 123                                     +---------+------------------+-----+--------+-------+  PTA     120               0.98 biphasic         +---------+------------------+-----+--------+-------+  DP      96                0.78 biphasic         +---------+------------------+-----+--------+-------+  Nicole Blackburn                0.67                  +---------+------------------+-----+--------+-------+      Assessment/Plan:     Nicole Blackburn is a 62 y.o. female present for foot discoloration stemming from venous congestion. Explained that she has normal perfusion and  he discoloration likely stems from venous congestion due to CVI. At this time she is asymptomatic other than the appearance of her feet.  Encouraged to utilize compression stockings and intermittent elevation in order to minimize progression. Follow up as need.      Daria Pastures MD [facility]

## 2023-06-30 ENCOUNTER — Encounter: Payer: Self-pay | Admitting: Vascular Surgery

## 2023-06-30 ENCOUNTER — Ambulatory Visit: Payer: Managed Care, Other (non HMO) | Admitting: Vascular Surgery

## 2023-06-30 ENCOUNTER — Ambulatory Visit (HOSPITAL_COMMUNITY)
Admission: RE | Admit: 2023-06-30 | Discharge: 2023-06-30 | Disposition: A | Discharge status: Home / Self Care | Payer: Managed Care, Other (non HMO) | Source: Ambulatory Visit | Attending: Vascular Surgery | Admitting: Vascular Surgery

## 2023-06-30 VITALS — BP 113/78 | HR 85 | Temp 97.9°F | Ht 59.0 in | Wt 97.7 lb

## 2023-06-30 DIAGNOSIS — I872 Venous insufficiency (chronic) (peripheral): Secondary | ICD-10-CM | POA: Diagnosis not present

## 2023-06-30 DIAGNOSIS — I10 Essential (primary) hypertension: Secondary | ICD-10-CM

## 2023-06-30 DIAGNOSIS — E785 Hyperlipidemia, unspecified: Secondary | ICD-10-CM | POA: Diagnosis not present

## 2023-06-30 DIAGNOSIS — R0989 Other specified symptoms and signs involving the circulatory and respiratory systems: Secondary | ICD-10-CM | POA: Insufficient documentation

## 2023-06-30 LAB — VAS US ABI WITH/WO TBI
Left ABI: 0.98
Right ABI: 1.02

## 2023-08-08 ENCOUNTER — Other Ambulatory Visit: Payer: Self-pay | Admitting: *Deleted

## 2023-08-08 DIAGNOSIS — M810 Age-related osteoporosis without current pathological fracture: Secondary | ICD-10-CM

## 2023-08-08 MED ORDER — ALENDRONATE SODIUM 70 MG PO TABS: ORAL_TABLET | ORAL | 3 refills | Status: DC

## 2023-08-17 ENCOUNTER — Encounter: Payer: Self-pay | Admitting: Family Medicine

## 2023-08-17 ENCOUNTER — Other Ambulatory Visit: Payer: Self-pay | Admitting: Family Medicine

## 2023-08-17 DIAGNOSIS — R053 Chronic cough: Secondary | ICD-10-CM

## 2023-08-17 MED ORDER — BENZONATATE 100 MG PO CAPS: 100.0000 mg | ORAL_CAPSULE | Freq: Two times a day (BID) | ORAL | 0 refills | Status: DC | PRN

## 2023-10-19 ENCOUNTER — Ambulatory Visit
Admission: RE | Admit: 2023-10-19 | Discharge: 2023-10-19 | Disposition: A | Discharge status: Home / Self Care | Payer: Managed Care, Other (non HMO) | Source: Ambulatory Visit | Attending: Family Medicine | Admitting: Family Medicine

## 2023-10-19 VITALS — BP 149/96 | HR 84 | Temp 98.3°F | Resp 16

## 2023-10-19 DIAGNOSIS — S39012A Strain of muscle, fascia and tendon of lower back, initial encounter: Secondary | ICD-10-CM

## 2023-10-19 DIAGNOSIS — M419 Scoliosis, unspecified: Secondary | ICD-10-CM | POA: Diagnosis not present

## 2023-10-19 MED ORDER — PREDNISONE 10 MG PO TABS: 30.0000 mg | ORAL_TABLET | Freq: Every day | ORAL | 0 refills | Status: DC

## 2023-10-19 MED ORDER — CYCLOBENZAPRINE HCL 5 MG PO TABS: 5.0000 mg | ORAL_TABLET | Freq: Every evening | ORAL | 0 refills | Status: DC | PRN

## 2023-10-19 NOTE — ED Provider Notes (Signed)
 [facility]  Note:  This document was prepared using Conservation officer, historic buildings and may include unintentional dictation errors.  MRN: [id number] DOB: Jan 28, 1961  Subjective:   Nicole Blackburn is a 63 y.o. female presenting for 1 week history of left lower back pain, tightness.  Symptoms started randomly.  No fall, trauma, numbness or tingling, saddle paresthesia, changes to bowel or urinary habits, radicular symptoms.  Has a history of spondylosis of the cervical region, cervical spinal stenosis, history of cervical surgery.  Has documented history of scoliosis of the lumbar region.  Declines imaging.  No current facility-administered medications for this encounter.  Current Outpatient Medications:    Vitamin D , Ergocalciferol , (DRISDOL ) 1.25 MG (50000 UNIT) CAPS capsule, Take 1 capsule (50,000 Units total) by mouth every 7 (seven) days., Disp: 12 capsule, Rfl: 1   alendronate  (FOSAMAX ) 70 MG tablet, TAKE 1 TABLET EVERY 7 DAYS WITH A FULL GLASS OF WATER ON AN EMPTY STOMACH, Disp: 12 tablet, Rfl: 3   amitriptyline  (ELAVIL ) 10 MG tablet, Take 1 tablet (10 mg total) by mouth at bedtime., Disp: 90 tablet, Rfl: 1   benzonatate  (TESSALON ) 100 MG capsule, Take 1 capsule (100 mg total) by mouth 2 (two) times daily as needed for cough., Disp: 20 capsule, Rfl: 0   No Known Allergies  Past Medical History:  Diagnosis Date   Abnormal pap 1997/1998   cryo   Anemia    HLD (hyperlipidemia)    diet controlled, no meds   Hypertension    no meds currently   Scoliosis      Past Surgical History:  Procedure Laterality Date   ANTERIOR CERVICAL DECOMP/DISCECTOMY FUSION N/A 12/04/2019   Procedure: CERVICAL FIVE-SIX, CERVICAL SIX-SEVEN ANTERIOR CERVICAL DECOMPRESSION/DISCECTOMY FUSION ALLOGRAFT PLATE;  Surgeon: Barbarann Oneil BROCKS, MD;  Location: [facility] OR;  Service: Orthopedics;  Laterality: N/A;   COLONOSCOPY     REFRACTIVE SURGERY Bilateral 2012   Dr Roz   UPPER GI ENDOSCOPY      WISDOM TOOTH EXTRACTION  age 19's    Family History  Problem Relation Age of Onset   COPD Mother    Osteoporosis Mother    Pulmonary embolism Mother    Alzheimer's disease Mother    Diabetes Father    Hypertension Father    Colon polyps Father    Hypertension Sister    Hypertension Brother    Stroke Maternal Grandmother 52   Cancer Paternal Grandmother 54       multiple myloma   Cancer Paternal Grandfather        ? kind cancer    Social History   Tobacco Use   Smoking status: Never   Smokeless tobacco: Never  Vaping Use   Vaping status: Never Used  Substance Use Topics   Alcohol use: Yes    Alcohol/week: 3.0 standard drinks of alcohol    Types: 3 Standard drinks or equivalent per week    Comment: Beer/Wine   Drug use: No    ROS   Objective:   Vitals: BP (!) 149/96 (BP Location: Left Arm)   Pulse 84   Temp 98.3 F (36.8 C) (Oral)   Resp 16   LMP 02/24/2015   SpO2 95%   Physical Exam Constitutional:      General: She is not in acute distress.    Appearance: Normal appearance. She is well-developed. She is not ill-appearing, toxic-appearing or diaphoretic.  HENT:     Head: Normocephalic and atraumatic.     Nose: Nose  normal.     Mouth/Throat:     Mouth: Mucous membranes are moist.  Eyes:     General: No scleral icterus.       Right eye: No discharge.        Left eye: No discharge.     Extraocular Movements: Extraocular movements intact.  Cardiovascular:     Rate and Rhythm: Normal rate.  Pulmonary:     Effort: Pulmonary effort is normal.  Musculoskeletal:     Lumbar back: No swelling, edema, deformity, signs of trauma, lacerations, spasms, tenderness or bony tenderness. Normal range of motion. Negative right straight leg raise test and negative left straight leg raise test. No scoliosis.  Skin:    General: Skin is warm and dry.  Neurological:     General: No focal deficit present.     Mental Status: She is alert and oriented to person, place,  and time.  Psychiatric:        Mood and Affect: Mood normal.        Behavior: Behavior normal.      Assessment and Plan :   PDMP not reviewed this encounter.  1. Lumbar strain, initial encounter   2. Scoliosis of lumbar spine, unspecified scoliosis type    Given her medical history and current level of pain offered an oral prednisone  course.  Advised that she follow-up with a spine specialist as she would benefit from physical therapy which she has done in the past.  May also benefit from consideration for an MRI.  Counseled patient on potential for adverse effects with medications prescribed/recommended today, ER and return-to-clinic precautions discussed, patient verbalized understanding.    Christopher Savannah, PA-C 10/23/23 0830

## 2023-10-19 NOTE — ED Triage Notes (Signed)
 Pt reports left sided low back pain since 10/14/23. States she carry groceries to her house. Pain is worse when she turns her torso to the left.Tylenol  gives some relief.

## 2023-11-20 LAB — HM MAMMOGRAPHY

## 2023-11-27 ENCOUNTER — Encounter: Payer: Self-pay | Admitting: Family Medicine

## 2023-12-27 ENCOUNTER — Encounter (HOSPITAL_BASED_OUTPATIENT_CLINIC_OR_DEPARTMENT_OTHER): Payer: Self-pay

## 2024-02-25 ENCOUNTER — Other Ambulatory Visit: Payer: Self-pay | Admitting: Family Medicine

## 2024-02-25 DIAGNOSIS — R059 Cough, unspecified: Secondary | ICD-10-CM

## 2024-03-24 ENCOUNTER — Other Ambulatory Visit: Payer: Self-pay | Admitting: Family Medicine

## 2024-03-24 DIAGNOSIS — R059 Cough, unspecified: Secondary | ICD-10-CM

## 2024-04-23 ENCOUNTER — Encounter (HOSPITAL_BASED_OUTPATIENT_CLINIC_OR_DEPARTMENT_OTHER): Payer: Self-pay | Admitting: Family Medicine

## 2024-04-23 ENCOUNTER — Encounter (HOSPITAL_BASED_OUTPATIENT_CLINIC_OR_DEPARTMENT_OTHER): Payer: Managed Care, Other (non HMO) | Admitting: Family Medicine

## 2024-04-23 ENCOUNTER — Ambulatory Visit (INDEPENDENT_AMBULATORY_CARE_PROVIDER_SITE_OTHER): Admitting: Family Medicine

## 2024-04-23 ENCOUNTER — Encounter: Admitting: Family Medicine

## 2024-04-23 VITALS — BP 152/90 | HR 73 | Ht 59.0 in | Wt 95.3 lb

## 2024-04-23 DIAGNOSIS — M810 Age-related osteoporosis without current pathological fracture: Secondary | ICD-10-CM

## 2024-04-23 DIAGNOSIS — R059 Cough, unspecified: Secondary | ICD-10-CM | POA: Diagnosis not present

## 2024-04-23 DIAGNOSIS — I10 Essential (primary) hypertension: Secondary | ICD-10-CM

## 2024-04-23 DIAGNOSIS — Z0000 Encounter for general adult medical examination without abnormal findings: Secondary | ICD-10-CM

## 2024-04-23 DIAGNOSIS — Z1211 Encounter for screening for malignant neoplasm of colon: Secondary | ICD-10-CM | POA: Insufficient documentation

## 2024-04-23 DIAGNOSIS — I872 Venous insufficiency (chronic) (peripheral): Secondary | ICD-10-CM | POA: Insufficient documentation

## 2024-04-23 MED ORDER — ALENDRONATE SODIUM 70 MG PO TABS: ORAL_TABLET | ORAL | 3 refills | Status: DC

## 2024-04-23 MED ORDER — VITAMIN D (ERGOCALCIFEROL) 1.25 MG (50000 UNIT) PO CAPS: 50000.0000 [IU] | ORAL_CAPSULE | ORAL | 1 refills | Status: DC

## 2024-04-23 MED ORDER — AMITRIPTYLINE HCL 10 MG PO TABS: 10.0000 mg | ORAL_TABLET | Freq: Every day | ORAL | 2 refills | Status: AC

## 2024-04-23 NOTE — Patient Instructions (Signed)
 Please check your BP at least 2-3x a week. Bring your readings with you to the nurse visit in 1-2 weeks for BP check

## 2024-04-23 NOTE — Progress Notes (Signed)
 Subjective:   Nicole Blackburn [birth year]  04/23/2024   CC: Chief Complaint  Patient presents with   Annual Exam    Pt is here today for her physical. Denies any concerns for today's visit.    HPI: Nicole Blackburn is a 63 y.o. female who presents to establish care and desiring to complete a routine health maintenance exam. She recently moved from [facility] to the [place] area and would like a closer PCP. She was seeing Dr. Antonio with [facility] and her last AE was August 2024. She has a hx of diet controlled HLD, HTN - diet controlled, and chronic venous insufficiency.   Labs collected at time of visit.   OSTEOPOROSIS: Nicole Blackburn presents for the medical management of Osteoporosis. She has been taking Alendronate  since 2021 per chart review. Patient has adequate calcium & vitamin D : yes, taking vitamin D3 50,000 units weekly.  Taking bisphosphonate's:yes; Alendronate  70mg  once weekly.  Medication compliance: excellent compliance Weight bearing exercises: yes Recent falls: no DEXA scan ordered to follow up existing osteoporosis, previous DEXA scan in 2021.   HYPERTENSION: Nicole Blackburn presents for the medical management of hypertension.  Patient's current hypertension medication regimen is: previously controlled without medication.  Patient is not currently taking prescribed medications for HTN.  Patient is  regularly keeping a check on BP at home.  Adhering to low sodium diet: Yes Exercising Regularly: Yes Denies headache, dizziness, CP, SHOB, vision changes.   BP Readings from Last 3 Encounters:  04/23/24 (!) 152/90  10/19/23 (!) 149/96  06/30/23 113/78     HEALTH SCREENINGS: - Vision Screening: up to date- [facility]  - Dental Visits: up to date - Pap smear: up to date - Breast Exam: not applicable - STD Screening: Declined - Mammogram (40+): Up to date  - Colonoscopy (45+): Up to date  - Bone Density (65+ or under 65 with predisposing  conditions): Ordered today  - Lung CA screening with low-dose CT:  Not applicable Adults age 7-80 who are current cigarette smokers or quit within the last 15 years. Must have 20 pack year history.   Depression and Anxiety Screen done today and results listed below:     04/23/2024    2:23 PM 04/20/2023    9:40 AM 04/07/2022    1:09 PM 04/01/2021    1:09 PM 02/20/2020   10:22 AM  Depression screen PHQ 2/9  Decreased Interest 0 2 0 0 0  Down, Depressed, Hopeless 0 2 1 0 0  PHQ - 2 Score 0 4 1 0 0  Altered sleeping 0 1     Tired, decreased energy 2 1     Change in appetite 0 0     Feeling bad or failure about yourself  0 1     Trouble concentrating 0 0     Moving slowly or fidgety/restless 0 0     Suicidal thoughts 0 0     PHQ-9 Score 2 7     Difficult doing work/chores Not difficult at all Somewhat difficult         04/23/2024    2:24 PM 04/20/2023    9:40 AM  GAD 7 : Generalized Anxiety Score  Nervous, Anxious, on Edge 0 2  Control/stop worrying 0 2  Worry too much - different things 0 2  Trouble relaxing 0 2  Restless 0 1  Easily annoyed or irritable 0 2  Afraid - awful might happen 0 1  Total  GAD 7 Score 0 12  Anxiety Difficulty Not difficult at all Somewhat difficult    IMMUNIZATIONS: - Tdap: Tetanus vaccination status reviewed: last tetanus booster within 10 years. - HPV: Not applicable - Influenza: Postponed to flu season - Pneumovax: Not applicable - Prevnar 20: Not applicable - Shingrix  (50+): Up to date   Past medical history, surgical history, medications, allergies, family history and social history reviewed with patient today and changes made to appropriate areas of the chart.   Past Medical History:  Diagnosis Date   Abnormal pap 1997/1998   cryo   Abnormal weight loss 11/10/2022   Anemia    Anxiety December 2022   Depression December 2022   Diarrhea 11/10/2022   Encounter for screening for malignant neoplasm of colon 04/23/2024   Encounter for  screening for malignant neoplasm of colon 04/23/2024   HLD (hyperlipidemia)    diet controlled, no meds   Hypertension    no meds currently   Impingement syndrome of right shoulder 03/01/2021   Preventative health care 04/01/2021   Rash 03/25/2018   Scoliosis    Trochanteric bursitis, left hip 11/06/2020    Past Surgical History:  Procedure Laterality Date   ANTERIOR CERVICAL DECOMP/DISCECTOMY FUSION N/A 12/04/2019   Procedure: CERVICAL FIVE-SIX, CERVICAL SIX-SEVEN ANTERIOR CERVICAL DECOMPRESSION/DISCECTOMY FUSION ALLOGRAFT PLATE;  Surgeon: Barbarann Oneil BROCKS, MD;  Location: [facility] OR;  Service: Orthopedics;  Laterality: N/A;   COLONOSCOPY     EYE SURGERY  Lasik - 6 years ago?   REFRACTIVE SURGERY Bilateral 2012   Dr Roz   SPINE SURGERY  12/04/19   UPPER GI ENDOSCOPY     WISDOM TOOTH EXTRACTION  age 25's    Current Outpatient Medications on File Prior to Visit  Medication Sig   benzonatate  (TESSALON ) 100 MG capsule Take 1 capsule (100 mg total) by mouth 2 (two) times daily as needed for cough.   No current facility-administered medications on file prior to visit.    No Known Allergies   Social History   Socioeconomic History   Marital status: Married    Spouse name: Not on file   Number of children: Not on file   Years of education: Not on file   Highest education level: Bachelor's degree (e.g., BA, AB, BS)  Occupational History   Occupation: fresh Equities trader  Tobacco Use   Smoking status: Never   Smokeless tobacco: Never  Vaping Use   Vaping status: Never Used  Substance and Sexual Activity   Alcohol use: Yes    Alcohol/week: 3.0 standard drinks of alcohol    Types: 3 Glasses of wine per week    Comment: Beer/Wine   Drug use: No   Sexual activity: Yes    Partners: Male    Birth control/protection: Post-menopausal  Other Topics Concern   Not on file  Social History Narrative   Exercise-  walking   Social Drivers of Health   Financial  Resource Strain: Low Risk  (04/19/2024)   Overall Financial Resource Strain (CARDIA)    Difficulty of Paying Living Expenses: Not very hard  Food Insecurity: No Food Insecurity (04/19/2024)   Hunger Vital Sign    Worried About Running Out of Food in the Last Year: Never true    Ran Out of Food in the Last Year: Never true  Transportation Needs: No Transportation Needs (04/19/2024)   PRAPARE - Administrator, Civil Service (Medical): No    Lack of Transportation (Non-Medical): No  Physical Activity: Inactive (04/19/2024)  Exercise Vital Sign    Days of Exercise per Week: 0 days    Minutes of Exercise per Session: Not on file  Stress: Stress Concern Present (04/19/2024)   [employer] of Occupational Health - Occupational Stress Questionnaire    Feeling of Stress: To some extent  Social Connections: Socially Integrated (04/19/2024)   Social Connection and Isolation Panel    Frequency of Communication with Friends and Family: Once a week    Frequency of Social Gatherings with Friends and Family: Twice a week    Attends Religious Services: More than 4 times per year    Active Member of [employer] or Organizations: Yes    Attends Engineer, structural: 1 to 4 times per year    Marital Status: Married  Catering manager Violence: Not on file   Social History   Tobacco Use  Smoking Status Never  Smokeless Tobacco Never   Social History   Substance and Sexual Activity  Alcohol Use Yes   Alcohol/week: 3.0 standard drinks of alcohol   Types: 3 Glasses of wine per week   Comment: Beer/Wine    Family History  Problem Relation Age of Onset   COPD Mother    Osteoporosis Mother    Pulmonary embolism Mother    Alzheimer's disease Mother    Diabetes Father    Hypertension Father    Colon polyps Father    Hypertension Sister    Anxiety disorder Sister    Hypertension Brother    Stroke Maternal Grandmother 56   Cancer Paternal Grandmother 41       multiple myloma    Cancer Paternal Grandfather        ? kind cancer   Diabetes Daughter    Early death Daughter    Stroke Daughter      ROS: Denies fever, fatigue, unexplained weight loss/gain, chest pain, SHOB, and palpitations. Denies neurological deficits, gastrointestinal or genitourinary complaints, and skin changes.   Objective:   Today's Vitals   04/23/24 1421 04/23/24 1454  BP: (!) 162/101 (!) 152/90  Pulse: 73   SpO2: 100%   Weight: 95 lb 4.8 oz (43.2 kg)   Height: 4' 11 (1.499 m)     GENERAL APPEARANCE: Well-appearing, in NAD. Well nourished.  SKIN: Pink, warm and dry. Turgor normal. No rash, lesion, ulceration, or ecchymoses. Hair evenly distributed.  HEENT: HEAD: Normocephalic.  EYES: PERRLA. EOMI. Lids intact w/o defect. Sclera white, Conjunctiva pink w/o exudate.  EARS: External ear w/o redness, swelling, masses or lesions. EAC clear. TM's intact, translucent w/o bulging, appropriate landmarks visualized. Appropriate acuity to conversational tones.  NOSE: Septum midline w/o deformity. Nares patent, mucosa pink and non-inflamed w/o drainage. No sinus tenderness.  THROAT: Uvula midline. Oropharynx clear. Tonsils non-inflamed w/o exudate. Oral mucosa pink and moist.  NECK: Supple, Trachea midline. Full ROM w/o pain or tenderness. No lymphadenopathy. Thyroid  non-tender w/o enlargement or palpable masses.  RESPIRATORY: Chest wall symmetrical w/o masses. Respirations even and non-labored. Breath sounds clear to auscultation bilaterally. No wheezes, rales, rhonchi, or crackles. CARDIAC: S1, S2 present, regular rate and rhythm. No gallops, murmurs, rubs, or clicks. PMI w/o lifts, heaves, or thrills. No carotid bruits. Capillary refill <2 seconds. Peripheral pulses 2+ bilaterally. GI: Abdomen soft w/o distention. Normoactive bowel sounds. No palpable masses or tenderness. No guarding or rebound tenderness. Liver and spleen w/o tenderness or enlargement. No CVA tenderness.  MSK: Muscle tone and  strength appropriate for age, w/o atrophy or abnormal movement.  EXTREMITIES: Active ROM intact,  w/o tenderness, crepitus, or contracture. No obvious joint deformities or effusions. No clubbing, edema, or cyanosis. Mild discoloration to bilateral feet which is chronic per patient. Coloration improves upon elevation of BLE. +2 pedal pulses bilaterally.  NEUROLOGIC: CN's II-XII intact. Motor strength symmetrical with no obvious weakness. No sensory deficits. DTR's 2+ symmetric bilaterally. Steady, even gait.  PSYCH/MENTAL STATUS: Alert, oriented x 3. Cooperative, appropriate mood and affect.    Assessment & Plan:  1. Annual physical exam (Primary) Discussed healthy lifestyle, exercise and will obtain fasting labs as part of AE today. Reviewed her health history with prior PCP. UTD on recommended vaccinations.  - CBC with Differential/Platelet - Comprehensive metabolic panel with GFR - Lipid panel - TSH  2. Age-related osteoporosis without current pathological fracture Obtain DXA scan with [facility]. Order faxed to [facility] today. Continue alendronate  as prescribed. Will check calcium and vit d with labs today. Discussed possiblity of discontinuing therapy after 5 years and/or switching to prolia /evenity pending dxa result. She is agreeable to this plan of care.   - alendronate  (FOSAMAX ) 70 MG tablet; TAKE 1 TABLET EVERY 7 DAYS WITH A FULL GLASS OF WATER ON AN EMPTY STOMACH  Dispense: 12 tablet; Refill: 3 - VITAMIN D  25 Hydroxy (Vit-D Deficiency, Fractures)  3. Cough in adult Controlled with Elavil  currently. Continue current regimen .  - amitriptyline  (ELAVIL ) 10 MG tablet; Take 1 tablet (10 mg total) by mouth at bedtime.  Dispense: 90 tablet; Refill: 2  4. Primary hypertension Uncontrolled in visit today. Patient is asymptomatic and previously managed without medication. Recommend she log home BP for the next week and return for nurse BP check. If elevated readings, will start medication.     Orders Placed This Encounter  Procedures   CBC with Differential/Platelet   Comprehensive metabolic panel with GFR   Lipid panel   TSH   VITAMIN D  25 Hydroxy (Vit-D Deficiency, Fractures)    PATIENT COUNSELING:  - Encouraged a healthy well-balanced diet. Patient may adjust caloric intake to maintain or achieve ideal body weight. May reduce intake of dietary saturated fat and total fat and have adequate dietary potassium and calcium preferably from fresh fruits, vegetables, and low-fat dairy products.   - Advised to avoid cigarette smoking. - Discussed with the patient that most people either abstain from alcohol or drink within safe limits (<=14/week and <=4 drinks/occasion for males, <=7/weeks and <= 3 drinks/occasion for females) and that the risk for alcohol disorders and other health effects rises proportionally with the number of drinks per week and how often a drinker exceeds daily limits. - Discussed cessation/primary prevention of drug use and availability of treatment for abuse.  - Discussed sexually transmitted diseases, avoidance of unintended pregnancy and contraceptive alternatives.  - Stressed the importance of regular exercise - Injury prevention: Discussed safety belts, safety helmets, smoke detector, smoking near bedding or upholstery.  - Dental health: Discussed importance of regular tooth brushing, flossing, and dental visits.   NEXT PREVENTATIVE PHYSICAL DUE IN 1 YEAR.  Return for 1 week BP check ; 1 year annual exam .  Patient to reach out to office if new, worrisome, or unresolved symptoms arise or if no improvement in patient's condition. Patient verbalized understanding and is agreeable to treatment plan. All questions answered to patient's satisfaction.    Thersia Schuyler Stark, [place]

## 2024-04-24 LAB — CBC WITH DIFFERENTIAL/PLATELET
Basophils Absolute: 0 x10E3/uL (ref 0.0–0.2)
Basos: 1 %
EOS (ABSOLUTE): 0.1 x10E3/uL (ref 0.0–0.4)
Eos: 2 %
Hematocrit: 41.6 % (ref 34.0–46.6)
Hemoglobin: 13.5 g/dL (ref 11.1–15.9)
Immature Grans (Abs): 0 x10E3/uL (ref 0.0–0.1)
Immature Granulocytes: 0 %
Lymphocytes Absolute: 1.5 x10E3/uL (ref 0.7–3.1)
Lymphs: 35 %
MCH: 31.8 pg (ref 26.6–33.0)
MCHC: 32.5 g/dL (ref 31.5–35.7)
MCV: 98 fL — ABNORMAL HIGH (ref 79–97)
Monocytes Absolute: 0.3 x10E3/uL (ref 0.1–0.9)
Monocytes: 6 %
Neutrophils Absolute: 2.4 x10E3/uL (ref 1.4–7.0)
Neutrophils: 56 %
Platelets: 225 x10E3/uL (ref 150–450)
RBC: 4.24 x10E6/uL (ref 3.77–5.28)
RDW: 13 % (ref 11.7–15.4)
WBC: 4.3 x10E3/uL (ref 3.4–10.8)

## 2024-04-24 LAB — COMPREHENSIVE METABOLIC PANEL WITH GFR
ALT: 13 IU/L (ref 0–32)
AST: 26 IU/L (ref 0–40)
Albumin: 4.8 g/dL (ref 3.9–4.9)
Alkaline Phosphatase: 91 IU/L (ref 44–121)
BUN/Creatinine Ratio: 20 (ref 12–28)
BUN: 13 mg/dL (ref 8–27)
Bilirubin Total: 0.6 mg/dL (ref 0.0–1.2)
CO2: 23 mmol/L (ref 20–29)
Calcium: 9.8 mg/dL (ref 8.7–10.3)
Chloride: 101 mmol/L (ref 96–106)
Creatinine, Ser: 0.64 mg/dL (ref 0.57–1.00)
Globulin, Total: 2.6 g/dL (ref 1.5–4.5)
Glucose: 78 mg/dL (ref 70–99)
Potassium: 4.1 mmol/L (ref 3.5–5.2)
Sodium: 139 mmol/L (ref 134–144)
Total Protein: 7.4 g/dL (ref 6.0–8.5)
eGFR: 100 mL/min/1.73 (ref >59)

## 2024-04-24 LAB — LIPID PANEL
Chol/HDL Ratio: 2.3 ratio (ref 0.0–4.4)
Cholesterol, Total: 256 mg/dL — ABNORMAL HIGH (ref 100–199)
HDL: 111 mg/dL (ref >39)
LDL Chol Calc (NIH): 122 mg/dL — ABNORMAL HIGH (ref 0–99)
Triglycerides: 140 mg/dL (ref 0–149)
VLDL Cholesterol Cal: 23 mg/dL (ref 5–40)

## 2024-04-24 LAB — TSH: TSH: 2.36 u[IU]/mL (ref 0.450–4.500)

## 2024-04-24 LAB — VITAMIN D 25 HYDROXY (VIT D DEFICIENCY, FRACTURES): Vit D, 25-Hydroxy: 29.8 ng/mL — ABNORMAL LOW (ref 30.0–100.0)

## 2024-04-25 ENCOUNTER — Encounter: Admitting: Family Medicine

## 2024-04-25 ENCOUNTER — Encounter: Payer: Managed Care, Other (non HMO) | Admitting: Family Medicine

## 2024-04-25 ENCOUNTER — Ambulatory Visit (HOSPITAL_BASED_OUTPATIENT_CLINIC_OR_DEPARTMENT_OTHER): Payer: Self-pay | Admitting: Family Medicine

## 2024-04-25 NOTE — Progress Notes (Signed)
 Hi Nicole Blackburn, Your blood counts are stable.  Your electrolytes, kidney, and liver function are within normal limits.  Your thyroid  function is stable.  Your vitamin D  is 29.8 (normal is 30-100).  I would recommend restarting the vitamin D  that we discussed at your visit and sent in for you.  Getting a good source of calcium and/or multivitamin with calcium will also help with absorption.  Your total cholesterol and LDL were elevated.  When compared to previous labs this looks very consistent.  Your triglycerides have significantly improved and your HDL (good cholesterol is over 100 which is excellent.  Based upon these values and calculation for risk score, statin therapy is currently not indicated and you can continue on supplements such as omega-3, red yeast rice and heart healthy diet.  I recommend we continue to monitor this yearly.

## 2024-05-14 ENCOUNTER — Other Ambulatory Visit (HOSPITAL_BASED_OUTPATIENT_CLINIC_OR_DEPARTMENT_OTHER): Payer: Self-pay | Admitting: Family Medicine

## 2024-05-14 ENCOUNTER — Ambulatory Visit: Attending: Family Medicine

## 2024-05-14 ENCOUNTER — Ambulatory Visit (HOSPITAL_BASED_OUTPATIENT_CLINIC_OR_DEPARTMENT_OTHER): Admitting: Family Medicine

## 2024-05-14 ENCOUNTER — Encounter (HOSPITAL_BASED_OUTPATIENT_CLINIC_OR_DEPARTMENT_OTHER): Payer: Self-pay | Admitting: Family Medicine

## 2024-05-14 VITALS — BP 136/90 | HR 99 | Ht 59.0 in | Wt 97.2 lb

## 2024-05-14 DIAGNOSIS — I493 Ventricular premature depolarization: Secondary | ICD-10-CM | POA: Diagnosis not present

## 2024-05-14 DIAGNOSIS — Z23 Encounter for immunization: Secondary | ICD-10-CM

## 2024-05-14 DIAGNOSIS — I10 Essential (primary) hypertension: Secondary | ICD-10-CM

## 2024-05-14 DIAGNOSIS — R002 Palpitations: Secondary | ICD-10-CM

## 2024-05-14 MED ORDER — AMLODIPINE BESYLATE 2.5 MG PO TABS: 2.5000 mg | ORAL_TABLET | Freq: Every day | ORAL | 3 refills | Status: DC

## 2024-05-14 NOTE — Patient Instructions (Signed)
 Schedule Dexa with [facility] please.

## 2024-05-14 NOTE — Progress Notes (Unsigned)
 EP to read.

## 2024-05-14 NOTE — Progress Notes (Signed)
 Subjective:   Nicole Blackburn July 23, 1961 05/14/2024  Chief Complaint  Patient presents with   Medical Management of Chronic Issues    2-3 week follow up; pt states she has been monitoring her BP at home and states it has been averaging around 140/90 with highest being 177/113. Denies any symptoms of headaches, vision problems, or chest pain.   HPI: Nicole Blackburn presents today for re-assessment and management of chronic medical conditions.   HYPERTENSION: Nicole Blackburn presents for the medical management of hypertension.  Patient's current hypertension medication regimen is: Diet controlled.  Patient is not currently taking prescribed medications for HTN.  Patient is  regularly keeping a check on BP at home. Average systolic readings ranges from 863-829, diastolic from 90-110 per patient's BP log.  Adhering to low sodium diet: Yes Exercising Regularly: yes Denies headache, dizziness, CP, SHOB, vision changes.    BP Readings from Last 3 Encounters:  05/14/24 (!) 136/90  04/23/24 (!) 152/90  10/19/23 (!) 149/96      The following portions of the patient's history were reviewed and updated as appropriate: past medical history, past surgical history, family history, social history, allergies, medications, and problem list.   Patient Active Problem List   Diagnosis Date Noted   Chronic venous insufficiency 04/23/2024   Varicose veins of right lower extremity with inflammation 11/04/2022   Age-related osteoporosis without current pathological fracture 04/07/2022   Femoral neck fracture (HCC) 08/12/2020   S/P cervical spinal fusion 12/11/2019   Cervical spinal stenosis 12/04/2019   Other spondylosis with radiculopathy, cervical region 03/20/2019   History of scoliosis 09/30/2018   Right hip pain 09/30/2018   Allergies 09/30/2018   HTN (hypertension) 07/20/2015   Cough 12/11/2012   Idiopathic scoliosis 12/11/2012   Past Medical History:  Diagnosis Date   Abnormal pap 1997/1998    cryo   Abnormal weight loss 11/10/2022   Anemia    Anxiety December 2022   Depression December 2022   Diarrhea 11/10/2022   Encounter for screening for malignant neoplasm of colon 04/23/2024   Encounter for screening for malignant neoplasm of colon 04/23/2024   HLD (hyperlipidemia)    diet controlled, no meds   Hypertension    no meds currently   Impingement syndrome of right shoulder 03/01/2021   Preventative health care 04/01/2021   Rash 03/25/2018   Scoliosis    Trochanteric bursitis, left hip 11/06/2020   Past Surgical History:  Procedure Laterality Date   ANTERIOR CERVICAL DECOMP/DISCECTOMY FUSION N/A 12/04/2019   Procedure: CERVICAL FIVE-SIX, CERVICAL SIX-SEVEN ANTERIOR CERVICAL DECOMPRESSION/DISCECTOMY FUSION ALLOGRAFT PLATE;  Surgeon: Barbarann Oneil BROCKS, MD;  Location: [facility] OR;  Service: Orthopedics;  Laterality: N/A;   COLONOSCOPY     EYE SURGERY  Lasik - 6 years ago?   REFRACTIVE SURGERY Bilateral 2012   Dr Roz   SPINE SURGERY  12/04/19   UPPER GI ENDOSCOPY     WISDOM TOOTH EXTRACTION  age 30's   Family History  Problem Relation Age of Onset   COPD Mother    Osteoporosis Mother    Pulmonary embolism Mother    Alzheimer's disease Mother    Diabetes Father    Hypertension Father    Colon polyps Father    Hypertension Sister    Anxiety disorder Sister    Hypertension Brother    Stroke Maternal Grandmother 65   Cancer Paternal Grandmother 73       multiple myloma   Cancer Paternal Grandfather        ?  kind cancer   Diabetes Daughter    Early death Daughter    Stroke Daughter    Outpatient Medications Prior to Visit  Medication Sig Dispense Refill   alendronate  (FOSAMAX ) 70 MG tablet TAKE 1 TABLET EVERY 7 DAYS WITH A FULL GLASS OF WATER ON AN EMPTY STOMACH 12 tablet 3   amitriptyline  (ELAVIL ) 10 MG tablet Take 1 tablet (10 mg total) by mouth at bedtime. 90 tablet 2   benzonatate  (TESSALON ) 100 MG capsule Take 1 capsule (100 mg total) by mouth 2 (two) times  daily as needed for cough. 20 capsule 0   Vitamin D , Ergocalciferol , (DRISDOL ) 1.25 MG (50000 UNIT) CAPS capsule Take 1 capsule (50,000 Units total) by mouth every 7 (seven) days. 12 capsule 1   No facility-administered medications prior to visit.   No Known Allergies   ROS: A complete ROS was performed with pertinent positives/negatives noted in the HPI. The remainder of the ROS are negative.    Objective:   Today's Vitals   05/14/24 1307 05/14/24 1325  BP: 130/86 (!) 136/90  Pulse: 99   SpO2: 99%   Weight: 97 lb 3.2 oz (44.1 kg)   Height: 4' 11 (1.499 m)     Physical Exam   GENERAL: Well-appearing, in NAD. Well nourished.  SKIN: Pink, warm and dry. No rash, lesion, ulceration, or ecchymoses.  Head: Normocephalic. NECK: Trachea midline. Full ROM w/o pain or tenderness. RESPIRATORY: Chest wall symmetrical. Respirations even and non-labored.  CARDIAC: S1, S2 present, regular rate and rhythm without murmur or gallops. Peripheral pulses 2+ bilaterally.  MSK: Muscle tone and strength appropriate for age.  NEUROLOGIC: No motor or sensory deficits. Steady, even gait. C2-C12 intact.  PSYCH/MENTAL STATUS: Alert, oriented x 3. Cooperative, appropriate mood and affect.   EKG: 05/14/2024:  Vent. rate 89 BPM PR interval 140 ms QRS duration 82 ms QT/QTcB 354/430 ms P-R-T axes 73 28 81  Sinus rhythm with occasional Premature ventricular complexes Nonspecific T wave abnormality Abnormal ECG When compared with ECG of 05-Sep-2019 09:37, Premature ventricular complexes are now Present  Health Maintenance Due  Topic Date Due   Pneumococcal Vaccine: 50+ Years (1 of 1 - PCV) Never done   DEXA SCAN  03/24/2022    No results found for any visits on 05/14/24.  The ASCVD Risk score (Arnett DK, et al., 2019) failed to calculate for the following reasons:   The valid HDL cholesterol range is 20 to 100 mg/dL     Assessment & Plan:  1. Encounter for immunization (Primary) - Flu vaccine  trivalent PF, 6mos and older(Flulaval,Afluria,Fluarix,Fluzone)  2. Primary hypertension 3. Asymptomatic PVCs Asymptomatic but uncontrolled. Will start Amlodipine  2.5mg  daily and continue to monitor BP. Labs reviewed from August 2025. Given new reading of occasional PVCs in which patient is asymptomatic. Will obtain Zio patch for monitoring and refer to Cardiology pending result of Zio patch. Discussed possible beta blocker but will obtain zio patch first.   - LONG TERM MONITOR (3-14 DAYS); Future - Ambulatory referral to Cardiology  Meds ordered this encounter  Medications   amLODipine  (NORVASC ) 2.5 MG tablet    Sig: Take 1 tablet (2.5 mg total) by mouth daily.    Dispense:  60 tablet    Refill:  3    Supervising Provider:   DE PERU, RAYMOND J [[id number]]   Lab Orders  No laboratory test(s) ordered today    Return in about 3 months (around 08/13/2024) for Follow up Hypertension, PVCs.    Patient  to reach out to office if new, worrisome, or unresolved symptoms arise or if no improvement in patient's condition. Patient verbalized understanding and is agreeable to treatment plan. All questions answered to patient's satisfaction.    Thersia Schuyler Stark, [place]

## 2024-05-22 LAB — HM DEXA SCAN

## 2024-05-27 ENCOUNTER — Encounter: Payer: Self-pay | Admitting: Family Medicine

## 2024-05-28 ENCOUNTER — Other Ambulatory Visit (HOSPITAL_BASED_OUTPATIENT_CLINIC_OR_DEPARTMENT_OTHER): Payer: Self-pay | Admitting: Family Medicine

## 2024-05-28 ENCOUNTER — Ambulatory Visit (HOSPITAL_BASED_OUTPATIENT_CLINIC_OR_DEPARTMENT_OTHER): Payer: Self-pay | Admitting: Family Medicine

## 2024-05-28 DIAGNOSIS — M810 Age-related osteoporosis without current pathological fracture: Secondary | ICD-10-CM

## 2024-05-28 NOTE — Progress Notes (Signed)
 Hi Nicole Blackburn, Your bone density results were sent to me and I have had a chance to review these.  In comparison to your bone density in 2021, it does show some mild progression of osteoporosis, specifically in the right femur.  While you are taking alendronate , there has been some slight decrease in the bone density based upon this study.  There are other options such as Evenity and Prolia  which may have been better success in reducing progression of osteoporosis.  If you are interested in learning more about these, we would get you set up with the osteoporosis clinic with [facility].  I be happy to place this referral for you if you are interested.  Please reach out if you have any further questions.

## 2024-05-31 DIAGNOSIS — I493 Ventricular premature depolarization: Secondary | ICD-10-CM | POA: Diagnosis not present

## 2024-05-31 DIAGNOSIS — R002 Palpitations: Secondary | ICD-10-CM | POA: Diagnosis not present

## 2024-06-03 ENCOUNTER — Ambulatory Visit (HOSPITAL_BASED_OUTPATIENT_CLINIC_OR_DEPARTMENT_OTHER): Payer: Self-pay | Admitting: Family Medicine

## 2024-06-03 DIAGNOSIS — I471 Supraventricular tachycardia, unspecified: Secondary | ICD-10-CM

## 2024-06-03 NOTE — Progress Notes (Signed)
 Hi Nicole Blackburn, Thank you for completing the Zio patch study.  Your results did show episodes of nonsustained SVT.  This indicates episodes in which your heart goes into a very fast rhythm.  This can cause increase in blood pressure and the PVCs which were noted on your EKG.  I would recommend we obtain evaluation by cardiology and I will go ahead and place this referral.  As discussed, we could change your medication from the amlodipine  to a more rate controlled medication such as metoprolol and see if this would help.  If you would like to speak first with cardiology regarding medication that is fine.  Please let me know

## 2024-06-13 ENCOUNTER — Encounter: Payer: Self-pay | Admitting: Physician Assistant

## 2024-06-13 ENCOUNTER — Ambulatory Visit: Admitting: Physician Assistant

## 2024-06-13 ENCOUNTER — Telehealth: Payer: Self-pay

## 2024-06-13 VITALS — Ht <= 58 in | Wt 97.0 lb

## 2024-06-13 DIAGNOSIS — M810 Age-related osteoporosis without current pathological fracture: Secondary | ICD-10-CM

## 2024-06-13 NOTE — Progress Notes (Signed)
 Office Visit Note   Patient: Nicole Blackburn           Date of Birth: Aug 04, 1961           MRN: [id number] Visit Date: 06/13/2024              Requested by: Knute Thersia Bitters, FNP [address] [place],  [place] [address] PCP: Knute Thersia Bitters, FNP   Assessment & Plan: Visit Diagnoses:  1. Age-related osteoporosis without current pathological fracture     Plan: Patient is a pleasant 63 year old woman referred by her primary care provider for evaluation of osteoporosis.  She has been diagnosed with osteoporosis..  She is status post femoral neck fracture in 2021 followed by Dr. Barbarann.  She has been on Fosamax  however has worsening of her bone density scores with the most recent one in being a T of -2.9.  She has no history of heart disease cancer or kidney disease.  No history of ulcers gastric bypass or reflux no history of seizures.  She was underwent menopause when she was 50 did not have any hormone replacement therapy she is not taking calcium right now because she does get some constipation with that her calcium level on her most recent blood work is adequate.  She also has a prescription for vitamin D  but she is not sure how much it is.  She uses 3 alcoholic beverages per week.  She is a former smoker.  She has had no dental issues and no family history of fragility fracture.  She is obviously at high risk for refracture.  She has a BMI of 20.  We discussed lifestyle changes including tracking her calcium and I gave her samples of a calcium supplement that I think will be more tolerable.  We also talked about incorporating some resistance training.  We spent time talking about medications side effects and treatment plans.  I think the medication that might sue her the best is Tymlos.  She does give her husband [name] injections so is familiar with the process.  We discussed medication length of treatment we discussed side effects including risks of both Tymlos and  Evenity.  We will go forward to try and authorize for Tymlos.  If this is not approved would then try Evenity or biosimilar.  It is medically necessary for her to be on a anabolic medication as she has failed treatment with Fosamax  and by FRAX scores has a 22% risk of fracture in the next 10 years and a almost 7% risk of hip fracture  Follow-Up Instructions: No follow-ups on file.   Orders:  No orders of the defined types were placed in this encounter.  No orders of the defined types were placed in this encounter.     Procedures: No procedures performed   Clinical Data: No additional findings.   Subjective: No chief complaint on file.   HPI patient is a pleasant 63 year old woman referred from her primary care for evaluation of osteoporosis.  Review of Systems  All other systems reviewed and are negative.    Objective: Vital Signs: Ht 4' 10 (1.473 m)   Wt 97 lb (44 kg)   LMP 02/24/2015   BMI 20.27 kg/m   Physical Exam Constitutional:      Appearance: Normal appearance.  Pulmonary:     Effort: Pulmonary effort is normal.  Neurological:     General: No focal deficit present.     Mental Status: She is alert and oriented  to person, place, and time.  Psychiatric:        Mood and Affect: Mood normal.        Behavior: Behavior normal.       Specialty Comments:  No specialty comments available.  Imaging: No results found.   PMFS History: Patient Active Problem List   Diagnosis Date Noted   Chronic venous insufficiency 04/23/2024   Varicose veins of right lower extremity with inflammation 11/04/2022   Age-related osteoporosis without current pathological fracture 04/07/2022   Femoral neck fracture (HCC) 08/12/2020   S/P cervical spinal fusion 12/11/2019   Cervical spinal stenosis 12/04/2019   Other spondylosis with radiculopathy, cervical region 03/20/2019   History of scoliosis 09/30/2018   Right hip pain 09/30/2018   Allergies 09/30/2018   HTN  (hypertension) 07/20/2015   Cough 12/11/2012   Idiopathic scoliosis 12/11/2012   Past Medical History:  Diagnosis Date   Abnormal pap 1997/1998   cryo   Abnormal weight loss 11/10/2022   Anemia    Anxiety December 2022   Depression December 2022   Diarrhea 11/10/2022   Encounter for screening for malignant neoplasm of colon 04/23/2024   Encounter for screening for malignant neoplasm of colon 04/23/2024   HLD (hyperlipidemia)    diet controlled, no meds   Hypertension    no meds currently   Impingement syndrome of right shoulder 03/01/2021   Preventative health care 04/01/2021   Rash 03/25/2018   Scoliosis    Trochanteric bursitis, left hip 11/06/2020    Family History  Problem Relation Age of Onset   COPD Mother    Osteoporosis Mother    Pulmonary embolism Mother    Alzheimer's disease Mother    Diabetes Father    Hypertension Father    Colon polyps Father    Hypertension Sister    Anxiety disorder Sister    Hypertension Brother    Stroke Maternal Grandmother 12   Cancer Paternal Grandmother 74       multiple myloma   Cancer Paternal Grandfather        ? kind cancer   Diabetes Daughter    Early death Daughter    Stroke Daughter     Past Surgical History:  Procedure Laterality Date   ANTERIOR CERVICAL DECOMP/DISCECTOMY FUSION N/A 12/04/2019   Procedure: CERVICAL FIVE-SIX, CERVICAL SIX-SEVEN ANTERIOR CERVICAL DECOMPRESSION/DISCECTOMY FUSION ALLOGRAFT PLATE;  Surgeon: Barbarann Oneil BROCKS, MD;  Location: [facility] OR;  Service: Orthopedics;  Laterality: N/A;   COLONOSCOPY     EYE SURGERY  Lasik - 6 years ago?   REFRACTIVE SURGERY Bilateral 2012   Dr Roz   SPINE SURGERY  12/04/19   UPPER GI ENDOSCOPY     WISDOM TOOTH EXTRACTION  age 38's   Social History   Occupational History   Occupation: [employer] trader  Tobacco Use   Smoking status: Never   Smokeless tobacco: Never  Vaping Use   Vaping status: Never Used  Substance and Sexual Activity    Alcohol use: Yes    Alcohol/week: 3.0 standard drinks of alcohol    Types: 3 Glasses of wine per week    Comment: Beer/Wine   Drug use: No   Sexual activity: Yes    Partners: Male    Birth control/protection: Post-menopausal

## 2024-06-24 ENCOUNTER — Encounter: Payer: Self-pay | Admitting: Radiology

## 2024-06-24 ENCOUNTER — Other Ambulatory Visit: Payer: Self-pay | Admitting: Physician Assistant

## 2024-06-24 MED ORDER — TYMLOS 3120 MCG/1.56ML ~~LOC~~ SOPN: 1.0000 | PEN_INJECTOR | Freq: Every day | SUBCUTANEOUS | Status: AC

## 2024-06-24 NOTE — Progress Notes (Unsigned)
 Patient approved for Tymlos, #[id number], valid 05/22/24 through 06/21/2026.  Submitted to [facility] for patient.  Emailed copay assistance card to patient.   Patient will call once she has s/w specialty pharmacy and arranged shipment, and we can look at using one of the samples if it will be a delay in shipment.  We will make appt for first injection, and do in office with her.

## 2024-07-15 ENCOUNTER — Encounter: Payer: Self-pay | Admitting: Radiology

## 2024-07-16 ENCOUNTER — Telehealth: Payer: Self-pay

## 2024-07-16 NOTE — Telephone Encounter (Signed)
 Pharmacy called stating that they did not receive a Rx.  Forward VM to your extension.

## 2024-08-13 ENCOUNTER — Ambulatory Visit (HOSPITAL_BASED_OUTPATIENT_CLINIC_OR_DEPARTMENT_OTHER): Admitting: Family Medicine

## 2024-08-13 ENCOUNTER — Encounter (HOSPITAL_BASED_OUTPATIENT_CLINIC_OR_DEPARTMENT_OTHER): Payer: Self-pay | Admitting: Family Medicine

## 2024-08-13 VITALS — BP 140/92 | HR 79 | Ht <= 58 in | Wt 97.6 lb

## 2024-08-13 DIAGNOSIS — I471 Supraventricular tachycardia, unspecified: Secondary | ICD-10-CM | POA: Diagnosis not present

## 2024-08-13 DIAGNOSIS — I7300 Raynaud's syndrome without gangrene: Secondary | ICD-10-CM | POA: Diagnosis not present

## 2024-08-13 DIAGNOSIS — I10 Essential (primary) hypertension: Secondary | ICD-10-CM | POA: Diagnosis not present

## 2024-08-13 MED ORDER — AMLODIPINE BESYLATE 5 MG PO TABS: 5.0000 mg | ORAL_TABLET | Freq: Every day | ORAL | 3 refills | Status: AC

## 2024-08-13 NOTE — Progress Notes (Signed)
 Subjective:   Nicole Blackburn 04-Nov-1960 08/13/2024  Chief Complaint  Patient presents with   Medical Management of Chronic Issues    18-month follow up; denies any main concerns for today's visit.    Discussed the use of AI scribe software for clinical note transcription with the patient, who gave verbal consent to proceed.  History of Present Illness      HPI: Nicole Blackburn presents today for re-assessment and management of chronic medical conditions.   Nicole Blackburn presents for the medical management of hypertension. Patient completed Zio patch for episodes of palpitations and had few episodes of Paroxysmal SVT. She denies recent episodes and has appt with cardiology in 1 week.   She has also noticed possible Raynaud's phenomenon to her bilateral hands and toes with cold weather. She reports discoloration and cold sensation to digits and itching when episode stops usually at bedtime.  Patient's current hypertension medication regimen is: Amlodipine  2.5mg   Patient is  currently taking prescribed medications for HTN.  Patient is  regularly keeping a check on BP at home.  Adhering to low sodium diet: Yes Exercising Regularly: Yes Denies headache, dizziness, CP, SHOB, vision changes.    BP Readings from Last 3 Encounters:  08/13/24 (!) 149/93  05/14/24 (!) 136/90  04/23/24 (!) 152/90      The following portions of the patient's history were reviewed and updated as appropriate: past medical history, past surgical history, family history, social history, allergies, medications, and problem list.   Patient Active Problem List   Diagnosis Date Noted   Chronic venous insufficiency 04/23/2024   Varicose veins of right lower extremity with inflammation 11/04/2022   Age-related osteoporosis without current pathological fracture 04/07/2022   Femoral neck fracture (HCC) 08/12/2020   S/P cervical spinal fusion 12/11/2019   Cervical spinal stenosis 12/04/2019   Other spondylosis  with radiculopathy, cervical region 03/20/2019   History of scoliosis 09/30/2018   Right hip pain 09/30/2018   Allergies 09/30/2018   HTN (hypertension) 07/20/2015   Cough 12/11/2012   Idiopathic scoliosis 12/11/2012   Past Medical History:  Diagnosis Date   Abnormal pap 1997/1998   cryo   Abnormal weight loss 11/10/2022   Anemia    Anxiety December 2022   Depression December 2022   Diarrhea 11/10/2022   Encounter for screening for malignant neoplasm of colon 04/23/2024   Encounter for screening for malignant neoplasm of colon 04/23/2024   HLD (hyperlipidemia)    diet controlled, no meds   Hypertension    no meds currently   Impingement syndrome of right shoulder 03/01/2021   Preventative health care 04/01/2021   Rash 03/25/2018   Scoliosis    Trochanteric bursitis, left hip 11/06/2020   Past Surgical History:  Procedure Laterality Date   ANTERIOR CERVICAL DECOMP/DISCECTOMY FUSION N/A 12/04/2019   Procedure: CERVICAL FIVE-SIX, CERVICAL SIX-SEVEN ANTERIOR CERVICAL DECOMPRESSION/DISCECTOMY FUSION ALLOGRAFT PLATE;  Surgeon: Barbarann Oneil BROCKS, MD;  Location: [facility] OR;  Service: Orthopedics;  Laterality: N/A;   COLONOSCOPY     EYE SURGERY  Lasik - 6 years ago?   REFRACTIVE SURGERY Bilateral 2012   Dr Roz   SPINE SURGERY  12/04/19   UPPER GI ENDOSCOPY     WISDOM TOOTH EXTRACTION  age 75's   Family History  Problem Relation Age of Onset   COPD Mother    Osteoporosis Mother    Pulmonary embolism Mother    Alzheimer's disease Mother    Diabetes Father    Hypertension Father  Colon polyps Father    Hypertension Sister    Anxiety disorder Sister    Hypertension Brother    Stroke Maternal Grandmother 88   Cancer Paternal Grandmother 19       multiple myloma   Cancer Paternal Grandfather        ? kind cancer   Diabetes Daughter    Early death Daughter    Stroke Daughter    Outpatient Medications Prior to Visit  Medication Sig Dispense Refill   Abaloparatide  (TYMLOS )  3120 MCG/1.56ML SOPN Inject 1 pen  into the skin daily. 1.56 mL ML   amitriptyline  (ELAVIL ) 10 MG tablet Take 1 tablet (10 mg total) by mouth at bedtime. 90 tablet 2   Vitamin D , Ergocalciferol , (DRISDOL ) 1.25 MG (50000 UNIT) CAPS capsule Take 1 capsule (50,000 Units total) by mouth every 7 (seven) days. 12 capsule 1   amLODipine  (NORVASC ) 2.5 MG tablet Take 1 tablet (2.5 mg total) by mouth daily. 60 tablet 3   alendronate  (FOSAMAX ) 70 MG tablet TAKE 1 TABLET EVERY 7 DAYS WITH A FULL GLASS OF WATER ON AN EMPTY STOMACH 12 tablet 3   benzonatate  (TESSALON ) 100 MG capsule Take 1 capsule (100 mg total) by mouth 2 (two) times daily as needed for cough. 20 capsule 0   No facility-administered medications prior to visit.   No Known Allergies   ROS: A complete ROS was performed with pertinent positives/negatives noted in the HPI. The remainder of the ROS are negative.    Objective:   Today's Vitals   08/13/24 1308  BP: (!) 149/93  Pulse: 79  SpO2: 100%  Weight: 97 lb 9.6 oz (44.3 kg)  Height: 4' 10 (1.473 m)    Physical Exam   GENERAL: Well-appearing, in NAD. Well nourished.  SKIN: Pink, warm and dry.  Head: Normocephalic. NECK: Trachea midline. Full ROM w/o pain or tenderness.  RESPIRATORY: Chest wall symmetrical. Respirations even and non-labored. Breath sounds clear to auscultation bilaterally.  CARDIAC: S1, S2 present, regular rate and rhythm without murmur or gallops. Peripheral pulses 2+ bilaterally.  MSK: Muscle tone and strength appropriate for age.  EXTREMITIES: Without clubbing, or edema. Mild discoloration to bilateral fingers with cap refill less than 3 seconds consistent with Raynaud's. No swelling, full ROM.  NEUROLOGIC: No motor or sensory deficits. Steady, even gait. C2-C12 intact.  PSYCH/MENTAL STATUS: Alert, oriented x 3. Cooperative, appropriate mood and affect.   Health Maintenance Due  Topic Date Due   Pneumococcal Vaccine: 50+ Years (1 of 1 - PCV) Never done        Assessment & Plan:  1. Primary hypertension (Primary) Recommend increasing Amlodipine  to 5mg . Patient agreeable and recommend BP recheck in 2-3 months. Goal BP reviewed with patient.   2. Paroxysmal SVT (supraventricular tachycardia) Reviewed patient's Zio patch and patient aware. She has Cardiology appt in 1 week and has recommended keeping this appt. Denies recent episodes.   3. Raynaud's phenomenon without gangrene Exam and HPI consistent with raynaud's. Discussed rewarming techniques and prevention and recommend continuing on CCB Amlodipine  5mg . Return to PCP if worsening or no improvement.    Meds ordered this encounter  Medications   amLODipine  (NORVASC ) 5 MG tablet    Sig: Take 1 tablet (5 mg total) by mouth daily.    Dispense:  90 tablet    Refill:  3    Supervising Provider:   DE CUBA, RAYMOND J [[id number]]   Lab Orders  No laboratory test(s) ordered today   No images are  attached to the encounter or orders placed in the encounter.  Return in about 3 months (around 11/11/2024) for Bp Check only with Nurse; AE in August 2026.    Patient to reach out to office if new, worrisome, or unresolved symptoms arise or if no improvement in patient's condition. Patient verbalized understanding and is agreeable to treatment plan. All questions answered to patient's satisfaction.    Thersia Schuyler Stark, [place]

## 2024-08-26 ENCOUNTER — Encounter (HOSPITAL_BASED_OUTPATIENT_CLINIC_OR_DEPARTMENT_OTHER): Payer: Self-pay | Admitting: Cardiology

## 2024-08-26 ENCOUNTER — Ambulatory Visit (HOSPITAL_BASED_OUTPATIENT_CLINIC_OR_DEPARTMENT_OTHER): Admitting: Cardiology

## 2024-08-26 VITALS — BP 136/84 | HR 82 | Ht <= 58 in | Wt 96.7 lb

## 2024-08-26 DIAGNOSIS — I10 Essential (primary) hypertension: Secondary | ICD-10-CM | POA: Diagnosis not present

## 2024-08-26 DIAGNOSIS — I471 Supraventricular tachycardia, unspecified: Secondary | ICD-10-CM

## 2024-08-26 DIAGNOSIS — Z712 Person consulting for explanation of examination or test findings: Secondary | ICD-10-CM

## 2024-08-26 DIAGNOSIS — I493 Ventricular premature depolarization: Secondary | ICD-10-CM

## 2024-08-26 NOTE — Progress Notes (Signed)
 Cardiology Office Note:  .   Date:  08/26/2024  ID:  Nicole Blackburn, DOB 1961-08-21, MRN [id number] PCP: Knute Thersia Bitters, FNP  [facility] Providers Cardiologist:  Shelda Bruckner, MD {  History of Present Illness: .   Nicole Blackburn is a 63 y.o. female with PMH hypertension, pSVT. She is seen as a new patient consultation at the request of Thersia Knute for hypertension, SVT and PVCs.  Referral from 05/14/24 and 06/03/24 reviewed. Initial referral for hypertension and PVCs. Second referral noted SVT. Note from 05/14/24 referenced elevated blood pressures, patient was not on medication at the time but was started on 2.5 mg amlodipine  at that visit. ECG done showed PVC, patient was asymptomatic. Zio monitor was done, which showed 9 brief episodes of SVT (longest 11 beats) with rare PVCs. Symptoms were sinus.   Today: She doesn't often feel her heart, but occasionally feels her heart race. Had an episode a few nights ago when her heart  was racing. Unsure how long it lasted, but she was able to fall asleep, and when she woke up it was normal.   She has no personal history of heart issues. FH: no history of MI. Father has hypertension, heart failure, he is 41 years old. Brother with hypertension.  Checks blood pressure at home, has been running good at home. Was 110s/80s this AM  We reviewed her Zio report together today, discussed findings, options, see below.  ROS positive for morning cough, she feels short of breath after coughing but only then. Rare sharp central chest pain, brief, nonlimiting. Hsa numbness/tingling in her lower leg if she stands for too long, better after she walks. No claudication.   ROS: Denies shortness of breath at rest or with normal exertion. No PND, orthopnea, LE edema or unexpected weight gain. No syncope. ROS otherwise negative except as noted.   Studies Reviewed: SABRA    EKG:       Physical Exam:   VS:  BP 136/84   Pulse 82   Ht 4' 10 (1.473 m)    Wt 96 lb 11.2 oz (43.9 kg)   LMP 02/24/2015   SpO2 95%   BMI 20.21 kg/m    Wt Readings from Last 3 Encounters:  08/26/24 96 lb 11.2 oz (43.9 kg)  08/13/24 97 lb 9.6 oz (44.3 kg)  06/13/24 97 lb (44 kg)    GEN: Well nourished, well developed in no acute distress HEENT: Normal, moist mucous membranes NECK: No JVD CARDIAC: regular rhythm, normal S1 and S2, no rubs or gallops. No murmur. VASCULAR: Radial and DP pulses 2+ bilaterally. No carotid bruits RESPIRATORY:  Clear to auscultation without rales, wheezing or rhonchi  ABDOMEN: Soft, non-tender, non-distended MUSCULOSKELETAL:  Ambulates independently SKIN: Warm and dry, no edema NEUROLOGIC:  Alert and oriented x 3. No focal neuro deficits noted. PSYCHIATRIC:  Normal affect    ASSESSMENT AND PLAN: .    Paroxysmal SVT -very brief, 9 episodes, longest 6 seconda. Reviewed her test results together today -symptoms were largely sinus rhythm/sinus tach -discussed metoprolol, risk/benefit. She would like to avoid, and I agree -discussed Kardia mobile today -reviewed red flag warning signs that need immediate medical attention -reviewed vagal maneuvers  Hypertension -recently started on amlodipine , well controlled on home medications  CV risk counseling and prevention -recommend heart healthy/Mediterranean diet, with whole grains, fruits, vegetable, fish, lean meats, nuts, and olive oil. Limit salt. -recommend moderate walking, 3-5 times/week for 30-50 minutes each session. Aim for at least 150 minutes/week.  Goal should be pace of 3 miles/hours, or walking 1.5 miles in 30 minutes -recommend avoidance of tobacco products. Avoid excess alcohol. -ASCVD risk score: The ASCVD Risk score (Arnett DK, et al., 2019) failed to calculate for the following reasons:   The valid HDL cholesterol range is 20 to 100 mg/dL    Dispo: I would be happy to see her back as needed  Signed, Shelda Bruckner, MD   Shelda Bruckner, MD, PhD,  [facility]  [facility]  [facility] at [facility] [address], [address] [place], [place] [address] [phone]

## 2024-08-26 NOTE — Patient Instructions (Addendum)
 Medication Instructions:  Your physician recommends that you continue on your current medications as directed. Please refer to the Current Medication list given to you today.   Labwork: NONE  Testing/Procedures: NONE  Follow-Up: AS NEEDED   Any Other Special Instructions Will Be Listed Below (If Applicable).  Look into kardia mobile  Vagal maneuvers are other ways to try to manage SVT. Here is some information for the [facility]: What are vagal maneuvers? Vagal maneuvers are physical actions that make your vagus nerve act on your hearts natural pacemaker, slowing down its electrical impulses. Your vagus nerve -- which goes from your brainstem to your belly -- plays a major role in your parasympathetic nervous system, which controls a number of things in your body, including heart rate.  Healthcare providers can do vagal maneuvers when it makes sense for a person with a fast heart rate. Dont try these yourself without talking to your healthcare provider first.  Types of vagal maneuvers Healthcare providers often use these:  Valsalva maneuver (bearing down like youre having a bowel movement (pooping). See below). Diving reflex. Carotid sinus massage. Gag reflex. Coughing. Handstand for 30 seconds. (In one study, healthcare providers taught parents how to help their kids do this.) Applied abdominal pressure. (Try lying on your back and folding your lower body toward your face until your feet are past your head. Take a breath and strain for 20 to 30 seconds.) Why are vagal maneuvers used? Vagal maneuvers are a first-line (first choice) treatment for supraventricular tachycardia (SVT) (fast heart rate) because theyre a low-risk, low-cost way to slow down a heart rate thats too fast. They can have a 20% to 40% success rate for getting certain fast heart rhythms (more than 100 beats a minute) back to normal rhythms.  Vagal maneuvers can also help your healthcare provider  diagnose which type of arrhythmia (irregular or abnormal beat) you have, as certain types of heart rhythm disorders classically respond to this maneuver.  Who shouldnt have vagal maneuvers? Your healthcare provider will only use vagal maneuvers if youre considered stable. They wont do vagal maneuvers if youre unstable, meaning you have:  Low blood pressure. Chest pain. Shortness of breath. A shortage of oxygen in your body. An inability to get enough blood to your organs. If youre unstable, your healthcare provider will do cardioversion (using medicine or an electrical shock) instead of vagal maneuvers. Anyone whos feeling unwell should go to an emergency room or call 911 immediately.  How commonly are vagal maneuvers used? Supraventricular tachycardia (SVT) is common in adults and children, and is the most common heart rhythm abnormality in children. An estimated 1 in 250 to 1 in 1,000 children have SVT. Since vagal maneuvers are the first treatment choice for SVT, theyre commonly used.  Procedure Details What happens before vagal maneuvers? Your healthcare provider will do an electrocardiogram (EKG) to check your heart rhythm. Theyll monitor your heart rate, blood pressure and oxygen level.  What happens during vagal maneuvers? Heres how healthcare providers do the three most common vagal maneuvers:  Diving reflex While sitting, youll take several deep breaths, hold your breath and then quickly put your whole face into a container of ice water. Keep your face submerged as long as you can.  The alternative approach is putting a bag of ice water or an ice-cold, wet towel against your face.  Valsalva maneuver While lying on your back, take a deep breath and act like youre exhaling but with your nose and  mouth closed for 10 to 30 seconds. It should feel like trying to breathe air out into a blocked straw.  In a modified version of this maneuver (which can work better than the  original method), you can do this while sitting up and then have your healthcare provider quickly drop the part of the bed supporting your upper body.  When they lower your bed, they bring your knees to your chest or put your legs in the air. Keep your legs in that position 30 to 45 seconds longer than holding your breath.  Another Valsalva technique healthcare providers use for kids is to have them blow on their thumb without letting any air out.  Carotid sinus massage Youll lie on your back with your head turned to one side. Your healthcare provider will use their fingers to push on your carotid sinus for five to 10 seconds. If it doesnt work, they can try again after a minute or try the other side of your neck.  What happens after vagal maneuvers? Hopefully, the arrhythmia (irregular or abnormal beat) resolves. Your healthcare provider will do another electrocardiogram (EKG) to see if the vagal maneuver was successful at bringing your heart rhythm back to normal. If they try vagal maneuvers two or three times and they dont work, they can give you medication to treat your arrhythmia. Medical or electrical cardioversion is another treatment option.  If vagal maneuvers dont work, your healthcare provider may contact a cardiologist (heart specialist) to evaluate you.

## 2024-10-05 ENCOUNTER — Other Ambulatory Visit (HOSPITAL_BASED_OUTPATIENT_CLINIC_OR_DEPARTMENT_OTHER): Payer: Self-pay | Admitting: Family Medicine

## 2024-11-11 ENCOUNTER — Ambulatory Visit (HOSPITAL_BASED_OUTPATIENT_CLINIC_OR_DEPARTMENT_OTHER)

## 2025-04-24 ENCOUNTER — Encounter (HOSPITAL_BASED_OUTPATIENT_CLINIC_OR_DEPARTMENT_OTHER): Admitting: Family Medicine
